# Patient Record
Sex: Male | Born: 1955 | Race: White | Hispanic: No | Marital: Married | Smoking: Never smoker
Health system: Southern US, Community
[De-identification: ages and names within clinical notes are randomized; demographics above are authoritative.]

## PROBLEM LIST (undated history)

## (undated) DIAGNOSIS — IMO0002 Reserved for concepts with insufficient information to code with codable children: Secondary | ICD-10-CM

## (undated) DIAGNOSIS — Z86010 Personal history of colonic polyps: Secondary | ICD-10-CM

## (undated) DIAGNOSIS — N503 Cyst of epididymis: Secondary | ICD-10-CM

## (undated) DIAGNOSIS — Z950 Presence of cardiac pacemaker: Secondary | ICD-10-CM

## (undated) DIAGNOSIS — I255 Ischemic cardiomyopathy: Secondary | ICD-10-CM

## (undated) DIAGNOSIS — E785 Hyperlipidemia, unspecified: Secondary | ICD-10-CM

## (undated) DIAGNOSIS — N289 Disorder of kidney and ureter, unspecified: Secondary | ICD-10-CM

## (undated) DIAGNOSIS — R9430 Abnormal result of cardiovascular function study, unspecified: Secondary | ICD-10-CM

## (undated) DIAGNOSIS — I454 Nonspecific intraventricular block: Secondary | ICD-10-CM

## (undated) DIAGNOSIS — I2510 Atherosclerotic heart disease of native coronary artery without angina pectoris: Secondary | ICD-10-CM

## (undated) DIAGNOSIS — I219 Acute myocardial infarction, unspecified: Secondary | ICD-10-CM

## (undated) DIAGNOSIS — I4891 Unspecified atrial fibrillation: Secondary | ICD-10-CM

## (undated) DIAGNOSIS — E875 Hyperkalemia: Secondary | ICD-10-CM

## (undated) DIAGNOSIS — Z95810 Presence of automatic (implantable) cardiac defibrillator: Secondary | ICD-10-CM

## (undated) DIAGNOSIS — N189 Chronic kidney disease, unspecified: Secondary | ICD-10-CM

## (undated) DIAGNOSIS — R7989 Other specified abnormal findings of blood chemistry: Secondary | ICD-10-CM

## (undated) DIAGNOSIS — E039 Hypothyroidism, unspecified: Secondary | ICD-10-CM

## (undated) DIAGNOSIS — E119 Type 2 diabetes mellitus without complications: Secondary | ICD-10-CM

## (undated) DIAGNOSIS — I5022 Chronic systolic (congestive) heart failure: Secondary | ICD-10-CM

## (undated) HISTORY — DX: Reserved for concepts with insufficient information to code with codable children: IMO0002

## (undated) HISTORY — DX: Hyperkalemia: E87.5

## (undated) HISTORY — DX: Other specified abnormal findings of blood chemistry: R79.89

## (undated) HISTORY — DX: Disorder of kidney and ureter, unspecified: N28.9

## (undated) HISTORY — PX: CYSTECTOMY: SUR359

## (undated) HISTORY — DX: Unspecified atrial fibrillation: I48.91

## (undated) HISTORY — DX: Chronic systolic (congestive) heart failure: I50.22

## (undated) HISTORY — PX: CATARACT EXTRACTION: SUR2

## (undated) HISTORY — DX: Chronic kidney disease, unspecified: N18.9

## (undated) HISTORY — PX: TONSILLECTOMY: SUR1361

## (undated) HISTORY — DX: Hypothyroidism, unspecified: E03.9

## (undated) HISTORY — DX: Ischemic cardiomyopathy: I25.5

## (undated) HISTORY — DX: Presence of automatic (implantable) cardiac defibrillator: Z95.810

## (undated) HISTORY — DX: Nonspecific intraventricular block: I45.4

## (undated) HISTORY — DX: Personal history of colonic polyps: Z86.010

## (undated) HISTORY — DX: Type 2 diabetes mellitus without complications: E11.9

## (undated) HISTORY — DX: Atherosclerotic heart disease of native coronary artery without angina pectoris: I25.10

## (undated) HISTORY — DX: Hyperlipidemia, unspecified: E78.5

## (undated) HISTORY — DX: Abnormal result of cardiovascular function study, unspecified: R94.30

## (undated) HISTORY — DX: Acute myocardial infarction, unspecified: I21.9

## (undated) HISTORY — DX: Cyst of epididymis: N50.3

---

## 2001-01-21 HISTORY — PX: CORONARY ARTERY BYPASS GRAFT: SHX141

## 2001-07-03 ENCOUNTER — Encounter: Payer: Self-pay | Admitting: Cardiology

## 2001-07-03 ENCOUNTER — Inpatient Hospital Stay (HOSPITAL_COMMUNITY): Admission: EM | Admit: 2001-07-03 | Discharge: 2001-07-09 | Payer: Self-pay | Admitting: Emergency Medicine

## 2001-07-03 ENCOUNTER — Encounter: Payer: Self-pay | Admitting: Emergency Medicine

## 2001-07-03 ENCOUNTER — Encounter: Payer: Self-pay | Admitting: *Deleted

## 2001-07-15 ENCOUNTER — Encounter: Admission: RE | Admit: 2001-07-15 | Discharge: 2001-10-13 | Payer: Self-pay | Admitting: Cardiology

## 2001-09-18 ENCOUNTER — Encounter: Payer: Self-pay | Admitting: Thoracic Surgery (Cardiothoracic Vascular Surgery)

## 2001-09-18 ENCOUNTER — Encounter
Admission: RE | Admit: 2001-09-18 | Discharge: 2001-09-18 | Payer: Self-pay | Admitting: Thoracic Surgery (Cardiothoracic Vascular Surgery)

## 2001-09-24 ENCOUNTER — Encounter: Payer: Self-pay | Admitting: Thoracic Surgery (Cardiothoracic Vascular Surgery)

## 2001-09-29 ENCOUNTER — Inpatient Hospital Stay (HOSPITAL_COMMUNITY)
Admission: RE | Admit: 2001-09-29 | Discharge: 2001-10-06 | Payer: Self-pay | Admitting: Thoracic Surgery (Cardiothoracic Vascular Surgery)

## 2001-09-29 ENCOUNTER — Encounter: Payer: Self-pay | Admitting: Thoracic Surgery (Cardiothoracic Vascular Surgery)

## 2001-09-29 ENCOUNTER — Encounter (INDEPENDENT_AMBULATORY_CARE_PROVIDER_SITE_OTHER): Payer: Self-pay | Admitting: Specialist

## 2001-09-30 ENCOUNTER — Encounter: Payer: Self-pay | Admitting: Thoracic Surgery (Cardiothoracic Vascular Surgery)

## 2001-10-01 ENCOUNTER — Encounter: Payer: Self-pay | Admitting: Thoracic Surgery (Cardiothoracic Vascular Surgery)

## 2001-10-02 ENCOUNTER — Encounter: Payer: Self-pay | Admitting: Thoracic Surgery (Cardiothoracic Vascular Surgery)

## 2001-11-12 ENCOUNTER — Encounter (INDEPENDENT_AMBULATORY_CARE_PROVIDER_SITE_OTHER): Payer: Self-pay | Admitting: Specialist

## 2001-11-12 ENCOUNTER — Inpatient Hospital Stay (HOSPITAL_COMMUNITY): Admission: RE | Admit: 2001-11-12 | Discharge: 2001-11-13 | Payer: Self-pay | Admitting: Otolaryngology

## 2001-12-01 ENCOUNTER — Encounter: Payer: Self-pay | Admitting: *Deleted

## 2001-12-01 ENCOUNTER — Ambulatory Visit (HOSPITAL_COMMUNITY): Admission: RE | Admit: 2001-12-01 | Discharge: 2001-12-01 | Payer: Self-pay | Admitting: *Deleted

## 2001-12-03 ENCOUNTER — Ambulatory Visit (HOSPITAL_COMMUNITY): Admission: RE | Admit: 2001-12-03 | Discharge: 2001-12-03 | Payer: Self-pay | Admitting: *Deleted

## 2001-12-03 ENCOUNTER — Encounter: Payer: Self-pay | Admitting: *Deleted

## 2002-01-25 ENCOUNTER — Encounter: Admission: RE | Admit: 2002-01-25 | Discharge: 2002-04-25 | Payer: Self-pay | Admitting: Neurology

## 2002-03-01 ENCOUNTER — Ambulatory Visit: Admission: RE | Admit: 2002-03-01 | Discharge: 2002-03-09 | Payer: Self-pay | Admitting: Radiation Oncology

## 2002-04-12 ENCOUNTER — Ambulatory Visit: Admission: RE | Admit: 2002-04-12 | Discharge: 2002-05-17 | Payer: Self-pay | Admitting: Radiation Oncology

## 2002-04-16 ENCOUNTER — Encounter: Payer: Self-pay | Admitting: Radiation Oncology

## 2002-04-16 ENCOUNTER — Ambulatory Visit (HOSPITAL_COMMUNITY): Admission: RE | Admit: 2002-04-16 | Discharge: 2002-04-16 | Payer: Self-pay | Admitting: Radiation Oncology

## 2002-06-14 ENCOUNTER — Ambulatory Visit: Admission: RE | Admit: 2002-06-14 | Discharge: 2002-06-14 | Payer: Self-pay | Admitting: Radiation Oncology

## 2002-06-15 ENCOUNTER — Encounter: Payer: Self-pay | Admitting: *Deleted

## 2002-06-15 ENCOUNTER — Ambulatory Visit (HOSPITAL_COMMUNITY): Admission: RE | Admit: 2002-06-15 | Discharge: 2002-06-15 | Payer: Self-pay | Admitting: *Deleted

## 2004-03-16 ENCOUNTER — Ambulatory Visit: Payer: Self-pay | Admitting: Family Medicine

## 2004-04-25 ENCOUNTER — Ambulatory Visit: Payer: Self-pay | Admitting: Family Medicine

## 2004-07-16 ENCOUNTER — Ambulatory Visit: Payer: Self-pay | Admitting: Internal Medicine

## 2005-01-18 ENCOUNTER — Ambulatory Visit: Payer: Self-pay | Admitting: Internal Medicine

## 2005-03-01 ENCOUNTER — Ambulatory Visit: Payer: Self-pay | Admitting: Internal Medicine

## 2005-03-15 ENCOUNTER — Ambulatory Visit: Payer: Self-pay | Admitting: Internal Medicine

## 2005-03-25 ENCOUNTER — Ambulatory Visit: Payer: Self-pay | Admitting: Cardiology

## 2005-06-12 ENCOUNTER — Ambulatory Visit: Payer: Self-pay | Admitting: Internal Medicine

## 2005-08-19 ENCOUNTER — Ambulatory Visit: Payer: Self-pay | Admitting: Internal Medicine

## 2005-09-30 ENCOUNTER — Ambulatory Visit: Payer: Self-pay | Admitting: Internal Medicine

## 2005-11-06 ENCOUNTER — Ambulatory Visit: Payer: Self-pay | Admitting: Internal Medicine

## 2005-11-06 LAB — CONVERTED CEMR LAB
Chol/HDL Ratio, serum: 6.6
Cholesterol: 202 mg/dL (ref 0–200)
HDL: 30.8 mg/dL — ABNORMAL LOW (ref >39.0)
Hgb A1c MFr Bld: 5.9 % (ref 4.6–6.0)
LDL DIRECT: 119.3 mg/dL
TSH: 1.47 microintl units/mL (ref 0.35–5.50)
Triglyceride fasting, serum: 306 mg/dL (ref 0–149)
VLDL: 61 mg/dL — ABNORMAL HIGH (ref 0–40)

## 2006-01-31 ENCOUNTER — Ambulatory Visit: Payer: Self-pay | Admitting: Internal Medicine

## 2006-02-14 ENCOUNTER — Ambulatory Visit: Payer: Self-pay | Admitting: Internal Medicine

## 2006-02-14 LAB — CONVERTED CEMR LAB
ALT: 20 units/L (ref 0–40)
AST: 22 units/L (ref 0–37)
Cholesterol: 148 mg/dL (ref 0–200)
Direct LDL: 92.2 mg/dL
HDL: 30.4 mg/dL — ABNORMAL LOW (ref >39.0)
TSH: 2.58 microintl units/mL (ref 0.35–5.50)
Total CHOL/HDL Ratio: 4.9
Triglycerides: 248 mg/dL (ref 0–149)
VLDL: 50 mg/dL — ABNORMAL HIGH (ref 0–40)

## 2006-02-15 DIAGNOSIS — F1410 Cocaine abuse, uncomplicated: Secondary | ICD-10-CM | POA: Insufficient documentation

## 2006-02-15 DIAGNOSIS — I2589 Other forms of chronic ischemic heart disease: Secondary | ICD-10-CM | POA: Insufficient documentation

## 2006-02-15 DIAGNOSIS — I480 Paroxysmal atrial fibrillation: Secondary | ICD-10-CM | POA: Insufficient documentation

## 2006-02-15 DIAGNOSIS — E039 Hypothyroidism, unspecified: Secondary | ICD-10-CM | POA: Insufficient documentation

## 2006-02-15 DIAGNOSIS — Z8571 Personal history of Hodgkin lymphoma: Secondary | ICD-10-CM | POA: Insufficient documentation

## 2006-05-26 ENCOUNTER — Ambulatory Visit: Payer: Self-pay | Admitting: Internal Medicine

## 2006-05-26 LAB — CONVERTED CEMR LAB
BUN: 23 mg/dL (ref 6–23)
Basophils Absolute: 0.1 10*3/uL (ref 0.0–0.1)
Basophils Relative: 0.8 % (ref 0.0–1.0)
CO2: 25 meq/L (ref 19–32)
Calcium: 9.4 mg/dL (ref 8.4–10.5)
Chloride: 103 meq/L (ref 96–112)
Creatinine, Ser: 1 mg/dL (ref 0.4–1.5)
Creatinine,U: 143.2 mg/dL
Eosinophils Absolute: 0.3 10*3/uL (ref 0.0–0.6)
Eosinophils Relative: 3.9 % (ref 0.0–5.0)
GFR calc Af Amer: 102 mL/min
GFR calc non Af Amer: 84 mL/min
Glucose, Bld: 100 mg/dL — ABNORMAL HIGH (ref 70–99)
HCT: 39.6 % (ref 39.0–52.0)
Hemoglobin: 13.5 g/dL (ref 13.0–17.0)
Hgb A1c MFr Bld: 6.3 % — ABNORMAL HIGH (ref 4.6–6.0)
Lymphocytes Relative: 27.7 % (ref 12.0–46.0)
MCHC: 34 g/dL (ref 30.0–36.0)
MCV: 82.3 fL (ref 78.0–100.0)
Microalb Creat Ratio: 18.9 mg/g (ref 0.0–30.0)
Microalb, Ur: 2.7 mg/dL — ABNORMAL HIGH (ref 0.0–1.9)
Monocytes Absolute: 0.4 10*3/uL (ref 0.2–0.7)
Monocytes Relative: 6.2 % (ref 3.0–11.0)
Neutro Abs: 4.3 10*3/uL (ref 1.4–7.7)
Neutrophils Relative %: 61.4 % (ref 43.0–77.0)
Platelets: 253 10*3/uL (ref 150–400)
Potassium: 4.5 meq/L (ref 3.5–5.1)
RBC: 4.82 M/uL (ref 4.22–5.81)
RDW: 13.2 % (ref 11.5–14.6)
Sodium: 136 meq/L (ref 135–145)
TSH: 2.77 microintl units/mL (ref 0.35–5.50)
WBC: 7.1 10*3/uL (ref 4.5–10.5)

## 2006-07-29 ENCOUNTER — Ambulatory Visit: Payer: Self-pay | Admitting: Internal Medicine

## 2006-08-27 ENCOUNTER — Encounter: Payer: Self-pay | Admitting: Internal Medicine

## 2006-12-23 ENCOUNTER — Encounter (INDEPENDENT_AMBULATORY_CARE_PROVIDER_SITE_OTHER): Payer: Self-pay | Admitting: *Deleted

## 2007-01-19 ENCOUNTER — Telehealth (INDEPENDENT_AMBULATORY_CARE_PROVIDER_SITE_OTHER): Payer: Self-pay | Admitting: *Deleted

## 2007-04-09 ENCOUNTER — Ambulatory Visit: Payer: Self-pay | Admitting: Cardiology

## 2007-05-12 ENCOUNTER — Ambulatory Visit: Payer: Self-pay | Admitting: Internal Medicine

## 2007-05-12 DIAGNOSIS — E785 Hyperlipidemia, unspecified: Secondary | ICD-10-CM | POA: Insufficient documentation

## 2007-05-14 ENCOUNTER — Telehealth (INDEPENDENT_AMBULATORY_CARE_PROVIDER_SITE_OTHER): Payer: Self-pay | Admitting: *Deleted

## 2007-05-14 LAB — CONVERTED CEMR LAB
ALT: 23 units/L (ref 0–53)
AST: 21 units/L (ref 0–37)
BUN: 29 mg/dL — ABNORMAL HIGH (ref 6–23)
CO2: 27 meq/L (ref 19–32)
Calcium: 9.7 mg/dL (ref 8.4–10.5)
Chloride: 104 meq/L (ref 96–112)
Cholesterol: 176 mg/dL (ref 0–200)
Creatinine, Ser: 1.2 mg/dL (ref 0.4–1.5)
Creatinine,U: 118.8 mg/dL
Direct LDL: 100.1 mg/dL
GFR calc Af Amer: 82 mL/min
GFR calc non Af Amer: 68 mL/min
Glucose, Bld: 136 mg/dL — ABNORMAL HIGH (ref 70–99)
HDL: 30 mg/dL — ABNORMAL LOW (ref >39.0)
Hemoglobin: 12.9 g/dL — ABNORMAL LOW (ref 13.0–17.0)
Hgb A1c MFr Bld: 6.8 % — ABNORMAL HIGH (ref 4.6–6.0)
Microalb Creat Ratio: 37 mg/g — ABNORMAL HIGH (ref 0.0–30.0)
Microalb, Ur: 4.4 mg/dL — ABNORMAL HIGH (ref 0.0–1.9)
PSA: 0.75 ng/mL (ref 0.10–4.00)
Potassium: 5 meq/L (ref 3.5–5.1)
Sodium: 138 meq/L (ref 135–145)
TSH: 1.6 microintl units/mL (ref 0.35–5.50)
Total CHOL/HDL Ratio: 5.9
Triglycerides: 339 mg/dL (ref 0–149)
VLDL: 68 mg/dL — ABNORMAL HIGH (ref 0–40)

## 2007-05-21 ENCOUNTER — Ambulatory Visit: Payer: Self-pay | Admitting: Internal Medicine

## 2007-05-22 ENCOUNTER — Encounter (INDEPENDENT_AMBULATORY_CARE_PROVIDER_SITE_OTHER): Payer: Self-pay | Admitting: *Deleted

## 2007-05-22 LAB — CONVERTED CEMR LAB
OCCULT 1: NEGATIVE
OCCULT 2: NEGATIVE
OCCULT 3: NEGATIVE

## 2007-09-11 ENCOUNTER — Ambulatory Visit: Payer: Self-pay | Admitting: Internal Medicine

## 2008-02-03 ENCOUNTER — Ambulatory Visit: Payer: Self-pay | Admitting: Internal Medicine

## 2008-02-03 DIAGNOSIS — F528 Other sexual dysfunction not due to a substance or known physiological condition: Secondary | ICD-10-CM | POA: Insufficient documentation

## 2008-02-09 ENCOUNTER — Telehealth (INDEPENDENT_AMBULATORY_CARE_PROVIDER_SITE_OTHER): Payer: Self-pay | Admitting: *Deleted

## 2008-02-09 LAB — CONVERTED CEMR LAB
ALT: 16 units/L (ref 0–53)
AST: 18 units/L (ref 0–37)
BUN: 27 mg/dL — ABNORMAL HIGH (ref 6–23)
Basophils Absolute: 0 10*3/uL (ref 0.0–0.1)
Basophils Relative: 0.3 % (ref 0.0–3.0)
CO2: 23 meq/L (ref 19–32)
Calcium: 9.4 mg/dL (ref 8.4–10.5)
Chloride: 107 meq/L (ref 96–112)
Cholesterol: 153 mg/dL (ref 0–200)
Creatinine, Ser: 1.3 mg/dL (ref 0.4–1.5)
Direct LDL: 86.8 mg/dL
Eosinophils Absolute: 0.4 10*3/uL (ref 0.0–0.7)
Eosinophils Relative: 4.5 % (ref 0.0–5.0)
GFR calc Af Amer: 75 mL/min
GFR calc non Af Amer: 62 mL/min
Glucose, Bld: 100 mg/dL — ABNORMAL HIGH (ref 70–99)
HCT: 36.7 % — ABNORMAL LOW (ref 39.0–52.0)
HDL: 27.6 mg/dL — ABNORMAL LOW (ref >39.0)
Hemoglobin: 12.8 g/dL — ABNORMAL LOW (ref 13.0–17.0)
Hgb A1c MFr Bld: 8.4 % — ABNORMAL HIGH (ref 4.6–6.0)
Lymphocytes Relative: 24.7 % (ref 12.0–46.0)
MCHC: 34.8 g/dL (ref 30.0–36.0)
MCV: 81 fL (ref 78.0–100.0)
Monocytes Absolute: 0.5 10*3/uL (ref 0.1–1.0)
Monocytes Relative: 5.3 % (ref 3.0–12.0)
Neutro Abs: 5.8 10*3/uL (ref 1.4–7.7)
Neutrophils Relative %: 65.2 % (ref 43.0–77.0)
Platelets: 219 10*3/uL (ref 150–400)
Potassium: 4.7 meq/L (ref 3.5–5.1)
RBC: 4.53 M/uL (ref 4.22–5.81)
RDW: 13.7 % (ref 11.5–14.6)
Sodium: 139 meq/L (ref 135–145)
TSH: 3.45 microintl units/mL (ref 0.35–5.50)
Total CHOL/HDL Ratio: 5.5
Triglycerides: 272 mg/dL (ref 0–149)
VLDL: 54 mg/dL — ABNORMAL HIGH (ref 0–40)
WBC: 8.9 10*3/uL (ref 4.5–10.5)

## 2008-02-11 ENCOUNTER — Encounter: Payer: Self-pay | Admitting: Internal Medicine

## 2008-02-12 ENCOUNTER — Telehealth: Payer: Self-pay | Admitting: Internal Medicine

## 2008-02-17 ENCOUNTER — Ambulatory Visit: Payer: Self-pay | Admitting: Internal Medicine

## 2008-02-17 DIAGNOSIS — R030 Elevated blood-pressure reading, without diagnosis of hypertension: Secondary | ICD-10-CM | POA: Insufficient documentation

## 2008-06-02 ENCOUNTER — Ambulatory Visit: Payer: Self-pay | Admitting: Internal Medicine

## 2008-06-08 LAB — CONVERTED CEMR LAB
Hgb A1c MFr Bld: 5.8 % (ref 4.6–6.5)
TSH: 0.24 microintl units/mL — ABNORMAL LOW (ref 0.35–5.50)

## 2008-08-15 ENCOUNTER — Telehealth (INDEPENDENT_AMBULATORY_CARE_PROVIDER_SITE_OTHER): Payer: Self-pay | Admitting: *Deleted

## 2008-09-08 ENCOUNTER — Ambulatory Visit: Payer: Self-pay | Admitting: Internal Medicine

## 2008-09-08 LAB — CONVERTED CEMR LAB
Iron: 45 ug/dL (ref 42–165)
Saturation Ratios: 11.9 % — ABNORMAL LOW (ref 20.0–50.0)
Transferrin: 271.2 mg/dL (ref 212.0–360.0)

## 2008-09-09 ENCOUNTER — Encounter (INDEPENDENT_AMBULATORY_CARE_PROVIDER_SITE_OTHER): Payer: Self-pay | Admitting: *Deleted

## 2008-09-14 LAB — CONVERTED CEMR LAB
ALT: 16 units/L (ref 0–53)
AST: 17 units/L (ref 0–37)
BUN: 31 mg/dL — ABNORMAL HIGH (ref 6–23)
CO2: 25 meq/L (ref 19–32)
Calcium: 8.9 mg/dL (ref 8.4–10.5)
Chloride: 106 meq/L (ref 96–112)
Cholesterol: 121 mg/dL (ref 0–200)
Creatinine, Ser: 1.3 mg/dL (ref 0.4–1.5)
Creatinine,U: 115.5 mg/dL
GFR calc non Af Amer: 61.65 mL/min (ref >60)
Glucose, Bld: 48 mg/dL — CL (ref 70–99)
HDL: 30.3 mg/dL — ABNORMAL LOW (ref >39.00)
Hemoglobin: 12.4 g/dL — ABNORMAL LOW (ref 13.0–17.0)
Hgb A1c MFr Bld: 5.7 % (ref 4.6–6.5)
LDL Cholesterol: 56 mg/dL (ref 0–99)
Microalb Creat Ratio: 8.7 mg/g (ref 0.0–30.0)
Microalb, Ur: 1 mg/dL (ref 0.0–1.9)
PSA: 1.02 ng/mL (ref 0.10–4.00)
Potassium: 4.5 meq/L (ref 3.5–5.1)
Sodium: 139 meq/L (ref 135–145)
TSH: 5.15 microintl units/mL (ref 0.35–5.50)
Total CHOL/HDL Ratio: 4
Triglycerides: 174 mg/dL — ABNORMAL HIGH (ref 0.0–149.0)
VLDL: 34.8 mg/dL (ref 0.0–40.0)

## 2008-09-27 ENCOUNTER — Ambulatory Visit: Payer: Self-pay | Admitting: Internal Medicine

## 2008-09-28 ENCOUNTER — Encounter (INDEPENDENT_AMBULATORY_CARE_PROVIDER_SITE_OTHER): Payer: Self-pay | Admitting: *Deleted

## 2008-09-28 LAB — CONVERTED CEMR LAB: Fecal Occult Bld: NEGATIVE

## 2009-02-10 ENCOUNTER — Ambulatory Visit: Payer: Self-pay | Admitting: Internal Medicine

## 2009-02-16 ENCOUNTER — Encounter (INDEPENDENT_AMBULATORY_CARE_PROVIDER_SITE_OTHER): Payer: Self-pay | Admitting: *Deleted

## 2009-02-16 LAB — CONVERTED CEMR LAB
Digitoxin Lvl: 0.1 ng/mL — ABNORMAL LOW (ref 0.8–2.0)
Hgb A1c MFr Bld: 6.3 % (ref 4.6–6.5)
TSH: 4.2 microintl units/mL (ref 0.35–5.50)

## 2009-05-17 ENCOUNTER — Ambulatory Visit: Payer: Self-pay | Admitting: Internal Medicine

## 2009-05-22 DIAGNOSIS — R944 Abnormal results of kidney function studies: Secondary | ICD-10-CM | POA: Insufficient documentation

## 2009-05-22 LAB — CONVERTED CEMR LAB
ALT: 21 units/L (ref 0–53)
AST: 21 units/L (ref 0–37)
BUN: 32 mg/dL — ABNORMAL HIGH (ref 6–23)
CO2: 28 meq/L (ref 19–32)
Calcium: 9.1 mg/dL (ref 8.4–10.5)
Chloride: 101 meq/L (ref 96–112)
Creatinine, Ser: 1.5 mg/dL (ref 0.4–1.5)
GFR calc non Af Amer: 52.12 mL/min (ref >60)
Glucose, Bld: 156 mg/dL — ABNORMAL HIGH (ref 70–99)
Hgb A1c MFr Bld: 6.7 % — ABNORMAL HIGH (ref 4.6–6.5)
Potassium: 4.7 meq/L (ref 3.5–5.1)
Sodium: 138 meq/L (ref 135–145)

## 2009-05-24 ENCOUNTER — Encounter: Payer: Self-pay | Admitting: Cardiology

## 2009-05-25 ENCOUNTER — Ambulatory Visit: Payer: Self-pay | Admitting: Cardiology

## 2009-05-25 DIAGNOSIS — N259 Disorder resulting from impaired renal tubular function, unspecified: Secondary | ICD-10-CM | POA: Insufficient documentation

## 2009-06-14 ENCOUNTER — Ambulatory Visit: Payer: Self-pay

## 2009-06-14 ENCOUNTER — Ambulatory Visit: Payer: Self-pay | Admitting: Internal Medicine

## 2009-06-14 ENCOUNTER — Encounter: Payer: Self-pay | Admitting: Cardiology

## 2009-06-14 ENCOUNTER — Ambulatory Visit (HOSPITAL_COMMUNITY): Admission: RE | Admit: 2009-06-14 | Discharge: 2009-06-14 | Payer: Self-pay | Admitting: Cardiology

## 2009-06-22 ENCOUNTER — Encounter: Payer: Self-pay | Admitting: Cardiology

## 2009-06-23 ENCOUNTER — Ambulatory Visit: Payer: Self-pay | Admitting: Cardiology

## 2009-07-20 ENCOUNTER — Ambulatory Visit: Payer: Self-pay | Admitting: Cardiology

## 2009-08-21 ENCOUNTER — Ambulatory Visit: Payer: Self-pay | Admitting: Internal Medicine

## 2009-08-21 ENCOUNTER — Telehealth (INDEPENDENT_AMBULATORY_CARE_PROVIDER_SITE_OTHER): Payer: Self-pay | Admitting: *Deleted

## 2009-08-21 LAB — CONVERTED CEMR LAB
BUN: 38 mg/dL — ABNORMAL HIGH (ref 6–23)
CO2: 25 meq/L (ref 19–32)
Calcium: 9.4 mg/dL (ref 8.4–10.5)
Chloride: 107 meq/L (ref 96–112)
Cholesterol: 175 mg/dL (ref 0–200)
Creatinine, Ser: 1.4 mg/dL (ref 0.4–1.5)
Direct LDL: 96.3 mg/dL
GFR calc non Af Amer: 55.47 mL/min (ref >60)
Glucose, Bld: 126 mg/dL — ABNORMAL HIGH (ref 70–99)
HDL: 32.3 mg/dL — ABNORMAL LOW (ref >39.00)
Hgb A1c MFr Bld: 6.7 % — ABNORMAL HIGH (ref 4.6–6.5)
Potassium: 5.5 meq/L — ABNORMAL HIGH (ref 3.5–5.1)
Sodium: 139 meq/L (ref 135–145)
Total CHOL/HDL Ratio: 5
Triglycerides: 351 mg/dL — ABNORMAL HIGH (ref 0.0–149.0)
VLDL: 70.2 mg/dL — ABNORMAL HIGH (ref 0.0–40.0)

## 2009-08-31 ENCOUNTER — Telehealth: Payer: Self-pay | Admitting: Cardiology

## 2009-08-31 ENCOUNTER — Ambulatory Visit: Payer: Self-pay | Admitting: Cardiology

## 2009-09-04 ENCOUNTER — Telehealth (INDEPENDENT_AMBULATORY_CARE_PROVIDER_SITE_OTHER): Payer: Self-pay | Admitting: *Deleted

## 2009-09-04 ENCOUNTER — Telehealth: Payer: Self-pay | Admitting: Cardiology

## 2009-09-08 ENCOUNTER — Ambulatory Visit: Payer: Self-pay | Admitting: Internal Medicine

## 2009-09-15 LAB — CONVERTED CEMR LAB
BUN: 34 mg/dL — ABNORMAL HIGH (ref 6–23)
CO2: 22 meq/L (ref 19–32)
Calcium: 9 mg/dL (ref 8.4–10.5)
Chloride: 105 meq/L (ref 96–112)
Creatinine, Ser: 1.5 mg/dL (ref 0.4–1.5)
GFR calc non Af Amer: 52.46 mL/min (ref >60)
Glucose, Bld: 136 mg/dL — ABNORMAL HIGH (ref 70–99)
Potassium: 4.8 meq/L (ref 3.5–5.1)
Sodium: 138 meq/L (ref 135–145)

## 2009-09-18 ENCOUNTER — Encounter: Payer: Self-pay | Admitting: Internal Medicine

## 2009-10-25 ENCOUNTER — Ambulatory Visit: Payer: Self-pay | Admitting: Cardiology

## 2009-12-21 ENCOUNTER — Encounter (INDEPENDENT_AMBULATORY_CARE_PROVIDER_SITE_OTHER): Payer: Self-pay | Admitting: *Deleted

## 2009-12-21 ENCOUNTER — Ambulatory Visit: Payer: Self-pay | Admitting: Internal Medicine

## 2009-12-21 LAB — CONVERTED CEMR LAB
ALT: 19 units/L
AST: 21 units/L
Hgb A1c MFr Bld: 6.3 %

## 2009-12-27 ENCOUNTER — Encounter (INDEPENDENT_AMBULATORY_CARE_PROVIDER_SITE_OTHER): Payer: Self-pay | Admitting: *Deleted

## 2009-12-27 ENCOUNTER — Telehealth: Payer: Self-pay | Admitting: Internal Medicine

## 2010-02-06 ENCOUNTER — Ambulatory Visit
Admission: RE | Admit: 2010-02-06 | Discharge: 2010-02-06 | Payer: Self-pay | Source: Home / Self Care | Attending: Cardiology | Admitting: Cardiology

## 2010-02-12 ENCOUNTER — Ambulatory Visit: Admission: RE | Admit: 2010-02-12 | Discharge: 2010-02-12 | Payer: Self-pay | Source: Home / Self Care

## 2010-02-12 ENCOUNTER — Ambulatory Visit (HOSPITAL_COMMUNITY)
Admission: RE | Admit: 2010-02-12 | Discharge: 2010-02-12 | Payer: Self-pay | Source: Home / Self Care | Attending: Cardiology | Admitting: Cardiology

## 2010-02-13 ENCOUNTER — Telehealth: Payer: Self-pay | Admitting: Cardiology

## 2010-02-13 ENCOUNTER — Encounter: Payer: Self-pay | Admitting: Cardiology

## 2010-02-20 ENCOUNTER — Telehealth (INDEPENDENT_AMBULATORY_CARE_PROVIDER_SITE_OTHER): Payer: Self-pay | Admitting: *Deleted

## 2010-02-20 ENCOUNTER — Encounter: Payer: Self-pay | Admitting: Internal Medicine

## 2010-02-20 ENCOUNTER — Encounter: Payer: Self-pay | Admitting: Cardiology

## 2010-02-20 NOTE — Progress Notes (Signed)
Summary: refill  Phone Note Refill Request Call back at Home Phone [phone] Message from:  Patient  Refills Requested: Medication #1:  GLUCOPHAGE 850 MG TABS 1 by mouth tid  Medication #2:  DIGITEK 0.125 MG TABS 1 by mouth qd  Medication #3:  LOTENSIN 20 MG TABS 1/2 by mouth once daily  Medication #4:  FUROSEMIDE 20 MG TABS take one tab by mouth daily patient is out of town he forgot his meds - call in to [facility] - [phone]  Initial call taken by: Okey Regal Spring,  September 04, 2009 8:49 AM    New/Updated Medications: FUROSEMIDE 40 MG TABS (FUROSEMIDE) 1 by mouth daily. Prescriptions: FUROSEMIDE 40 MG TABS (FUROSEMIDE) 1 by mouth daily.  #30 x 0   Entered by:   Army Fossa CMA   Authorized by:   Nolon Rod. Paz MD   Signed by:   Army Fossa CMA on 09/04/2009   Method used:   Print then Give to Patient   RxID:   [id number] LOTENSIN 20 MG TABS (BENAZEPRIL HCL) 1/2 by mouth once daily  #30 Tablet x 0   Entered by:   Army Fossa CMA   Authorized by:   Nolon Rod. Paz MD   Signed by:   Army Fossa CMA on 09/04/2009   Method used:   Print then Give to Patient   RxID:   [id number] DIGITEK 0.125 MG TABS (DIGOXIN) 1 by mouth qd  #30 Tablet x 0   Entered by:   Army Fossa CMA   Authorized by:   Nolon Rod. Paz MD   Signed by:   Army Fossa CMA on 09/04/2009   Method used:   Print then Give to Patient   RxID:   [id number] GLUCOPHAGE 850 MG TABS (METFORMIN HCL) 1 by mouth tid  #90 Tablet x 0   Entered by:   Army Fossa CMA   Authorized by:   Nolon Rod. Paz MD   Signed by:   Army Fossa CMA on 09/04/2009   Method used:   Print then Give to Patient   RxID:   [id number]

## 2010-02-20 NOTE — Progress Notes (Signed)
Summary: Labs  Phone Note Outgoing Call   Summary of Call: --potassium elevated from baseline, related to recent decrease on Lasix? Will discuss with cardiology in the morning -- cholesterol used to be better --renal function stable --diabetes is stable Jose E. Paz MD  August 21, 2009 7:02 PM   Follow-up for Phone Call        primary cardiologist out of town Plan: increase Lasix from 20 mg to 40 mg daily nurse visit for BP check (not charge) and BMP in 10 days He is to  let us know if he feels weak or orthostatic. also cholesterol used to be better, watch diet Jose E. Paz MD  August 22, 2009 1:32 PM   Additional Follow-up for Phone Call Additional follow up Details #1::        Patient is aware and will call back to make appt, he was driving right now.  Additional Follow-up by: Harold Barban,  August 22, 2009 2:21 PM

## 2010-02-20 NOTE — Assessment & Plan Note (Signed)
Summary: per check out/sf   Visit Type:  Follow-up Referring Provider:  Zetta Bills, MD Primary Provider:  Nolon Rod. Paz MD  CC:  cardiomyopathy.  History of Present Illness: Tthe patient is seen today to followup cardiomyopathy. He is feeling well.  He is quite active.  He is on optimal doses of beta blockers and ACE inhibitors.  Because his potassium was on a higher side, I have not yet been able to spironolactone.  This will be considered over time.  He has been seen by Dr. Allena Selene Peltzer both nephrology.  He has chronic kidney disease stage III.  It is thought to be multifactorial.  He is to be watched carefully.  Current Medications (verified): 1)  Amaryl 2 Mg Tabs (Glimepiride) .... Daily 2)  Glucophage 850 Mg Tabs (Metformin Hcl) .Marland Kitchen.. 1 By Mouth Tid 3)  Digitek 0.125 Mg Tabs (Digoxin) .Marland Kitchen.. 1 By Mouth Qd 4)  Lotensin 20 Mg Tabs (Benazepril Hcl) .... 1/2 By Mouth Once Daily 5)  Furosemide 40 Mg Tabs (Furosemide) .Marland Kitchen.. 1 By Mouth Daily. 6)  Synthroid 88 Mcg Tabs (Levothyroxine Sodium) .... One By Mouth Daily 7)  Zocor 40 Mg Tabs (Simvastatin) .Marland Kitchen.. 1 By Mouth At Bedtime. 8)  Bayer Aspirin 325 Mg Tabs (Aspirin) .... Take 1 Tablet By Mouth Once A Day 9)  Carvedilol 25 Mg Tabs (Carvedilol) .... Take One Tablet By Mouth Twice A Day  Allergies (verified): No Known Drug Allergies  Past History:  Past Medical History: EF  40% range /   EF 20-25%...echo..06/14/2009.Marland Kitchen CAD- CABG 2003 Hyperlipidemia...low HDL Diabetes mellitus, type II Hypothyroidism ABUSE, COCAINE, ALCOHOL, ETC ...resolved for many years ATRIAL FIBRILLATION, HX OF  HODGKIN'S DISEASE--Dx aprox 2004, s/p XRT-Chemo IVCD Creatinine... up to 1.5.Marland KitchenMarland KitchenMarland Kitchen 2011  /  CKD 3.. multifactorial... Dr. Allena Alliya Marcon.. consult September 18, 2009 Hyperkalemia   .... August, 2011... repeat potassium pending  Review of Systems       Patient denies fever, chills, headache, sweats, rash, change in vision, change in hearing, chest pain, cough, nausea vomiting,  urinary symptoms.  All other systems are reviewed and are negative  Vital Signs:  Patient profile:   55 year old male Height:      73 inches Weight:      210 pounds BMI:     27.81 Pulse rate:   65 / minute BP sitting:   98 / 50  (left arm) Cuff size:   regular  Vitals Entered By: Hardin Negus, RMA (October 25, 2009 8:58 AM)  Physical Exam  General:  patient is stable. Eyes:  no xanthelasma. Neck:  no jugular venous distention. Lungs:  lungs are clear.  Respiratory effort is nonlabored. Heart:  cardiac exam reveals an S1-S2.  No clicks or significant murmurs. Abdomen:  abdomen is soft. Extremities:  no peripheral edema. Psych:  patient is oriented to person time and place.  Affect is normal.   Impression & Recommendations:  Problem # 1:  HYPERKALEMIA (ICD-276.7) potassium was 4.8 when most recently checked.  I cannot use spironolactone at this time.  I will consider getting the future.  Problem # 2:  * EJECTION FRACTION 25% The patient is on optimal medications at this time.  I will see him back in 3 months and consider followup echo at that point.  Problem # 3:  RENAL INSUFFICIENCY (ICD-588.9) See the complete evaluation by Dr. Allena Umeka Wrench.  All of the studies mentioned in the consult note will be followed up by Dr. Allena Briellah Baik and Dr.Paz.  Problem # 4:  INCREASED BLOOD PRESSURE (ICD-796.2) Blood pressure is on the low side today and we cannot push his medicines any further.  Problem # 5:  CORONARY ARTERY DISEASE (ICD-414.00)  His updated medication list for this problem includes:    Lotensin 20 Mg Tabs (Benazepril hcl) .Marland Kitchen... 1/2 by mouth once daily    Bayer Aspirin 325 Mg Tabs (Aspirin) .Marland Kitchen... Take 1 tablet by mouth once a day    Carvedilol 25 Mg Tabs (Carvedilol) .Marland Kitchen... Take one tablet by mouth twice a day CAD is stable at this time.  Problem # 6:  HYPOTHYROIDISM (ICD-244.9)  His updated medication list for this problem includes:    Synthroid 88 Mcg Tabs (Levothyroxine  sodium) ..... One by mouth daily Thyroid is treated.  Patient Instructions: 1)  Follow up in 3 months

## 2010-02-20 NOTE — Assessment & Plan Note (Signed)
Summary: bp check  Nurse Visit   Vital Signs:  Patient profile:   55 year old male Weight:      210 pounds Pulse rate:   62 / minute Pulse rhythm:   regular BP sitting:   116 / 82  (left arm) Cuff size:   large  Vitals Entered By: Army Fossa CMA (September 08, 2009 11:28 AM)  Impression & Recommendations:  Problem # 1:  RENAL INSUFFICIENCY (ICD-588.9) beta blockers were increased, Lasix decreased.  BP stable.  We'll check renal function today  Complete Medication List: 1)  Amaryl 2 Mg Tabs (Glimepiride) .... Daily 2)  Glucophage 850 Mg Tabs (Metformin hcl) .Marland Kitchen.. 1 by mouth tid 3)  Digitek 0.125 Mg Tabs (Digoxin) .Marland Kitchen.. 1 by mouth qd 4)  Lotensin 20 Mg Tabs (Benazepril hcl) .... 1/2 by mouth once daily 5)  Furosemide 40 Mg Tabs (Furosemide) .Marland Kitchen.. 1 by mouth daily. 6)  Synthroid 88 Mcg Tabs (Levothyroxine sodium) .... One by mouth daily 7)  Zocor 20 Mg Tabs (Simvastatin) .Marland Kitchen.. 1 by mouth at bedtime. 8)  Bayer Aspirin 325 Mg Tabs (Aspirin) .... Take 1 tablet by mouth once a day 9)  Carvedilol 25 Mg Tabs (Carvedilol) .... Take one tablet by mouth twice a day  Other Orders: No Charge Patient Arrived (NCPA0) (NCPA0)   Current Medications (verified): 1)  Amaryl 2 Mg Tabs (Glimepiride) .... Daily 2)  Glucophage 850 Mg Tabs (Metformin Hcl) .Marland Kitchen.. 1 By Mouth Tid 3)  Digitek 0.125 Mg Tabs (Digoxin) .Marland Kitchen.. 1 By Mouth Qd 4)  Lotensin 20 Mg Tabs (Benazepril Hcl) .... 1/2 By Mouth Once Daily 5)  Furosemide 40 Mg Tabs (Furosemide) .Marland Kitchen.. 1 By Mouth Daily. 6)  Synthroid 88 Mcg Tabs (Levothyroxine Sodium) .... One By Mouth Daily 7)  Zocor 20 Mg Tabs (Simvastatin) .Marland Kitchen.. 1 By Mouth At Bedtime. 8)  Bayer Aspirin 325 Mg Tabs (Aspirin) .... Take 1 Tablet By Mouth Once A Day 9)  Carvedilol 25 Mg Tabs (Carvedilol) .... Take One Tablet By Mouth Twice A Day  Allergies (verified): No Known Drug Allergies  Orders Added: 1)  No Charge Patient Arrived (NCPA0) [NCPA0]

## 2010-02-20 NOTE — Miscellaneous (Signed)
  Clinical Lists Changes  Observations: Added new observation of ECHOINTERP:   - Left ventricle: The cavity size was mildly dilated. Wall thickness       was normal. Systolic function was severely reduced. The estimated       ejection fraction was in the range of 20% to 25%. Diffuse       hypokinesis. There is akinesis and scarring of the basal-mid       inferolateral myocardium. There is dyskinesis and aneurysmal       deformity of the apical myocardium. There is akinesis of the       entire inferior myocardium. Doppler parameters are consistent with       abnormal left ventricular relaxation (grade 1 diastolic       dysfunction).     - Mitral valve: Mildly calcified annulus.     - Left atrium: The atrium was mildly dilated. (06/14/2009 9:43)      Echocardiogram  Procedure date:  06/14/2009  Findings:        - Left ventricle: The cavity size was mildly dilated. Wall thickness       was normal. Systolic function was severely reduced. The estimated       ejection fraction was in the range of 20% to 25%. Diffuse       hypokinesis. There is akinesis and scarring of the basal-mid       inferolateral myocardium. There is dyskinesis and aneurysmal       deformity of the apical myocardium. There is akinesis of the       entire inferior myocardium. Doppler parameters are consistent with       abnormal left ventricular relaxation (grade 1 diastolic       dysfunction).     - Mitral valve: Mildly calcified annulus.     - Left atrium: The atrium was mildly dilated.

## 2010-02-20 NOTE — Letter (Signed)
Summary: [facility]ODGKIN [facility]   Imported By: Freddy Jaksch 09/04/2006 16:37:53  _____________________________________________________________________  External Attachment:    Type:   Image     Comment:   External Document

## 2010-02-20 NOTE — Progress Notes (Signed)
Summary: med clarification  Phone Note Call from Patient   Caller: Patient Summary of Call: patient called back to let us know that he is taking Simvastatin 80mg  1/2 tab daily and Carvediolol 25mg  two times a day which was increased to that dose at his last ov w/Dr [name]  Initial call taken by: Meredith Staggers, RN,  August 31, 2009 3:01 PM  Follow-up for Phone Call        Good  Talitha Givens, MD, [facility]  September 05, 2009 1:39 PM  left mess for pt to cont. current meds Meredith Staggers, RN  September 05, 2009 4:02 PM

## 2010-02-20 NOTE — Assessment & Plan Note (Signed)
Summary: 4 mth fu/ns/kdc   Vital Signs:  Patient profile:   55 year old male Height:      73 inches Weight:      202 pounds BMI:     26.75 Pulse rate:   62 / minute Pulse rhythm:   regular BP sitting:   128 / 80  (left arm) Cuff size:   large  Vitals Entered By: Shary Decamp (February 10, 2009 9:40 AM) CC: rov - fasting Is Patient Diabetic? Yes   History of Present Illness: routine office visit Congestive heart failure --has been taking less Lasix than  prescribed, was just confused   Diabetes--patient stopped checking sugars  a while  back because all his CBGs were within normal    Current Medications (verified): 1)  Amaryl 4 Mg Tabs (Glimepiride) .... 1/2  By Mouth Qam; 2)  Glucophage 850 Mg Tabs (Metformin Hcl) .Marland Kitchen.. 1 By Mouth Tid 3)  Digitek 0.125 Mg Tabs (Digoxin) .Marland Kitchen.. 1 By Mouth Qd 4)  Lotensin 20 Mg Tabs (Benazepril Hcl) .... 1/2 By Mouth Once Daily 5)  Metoprolol Tartrate 50 Mg Tabs (Metoprolol Tartrate) .... 1/2 By Mouth Bid 6)  Lasix 20 Mg Tabs (Furosemide) .... 1/2 By Mouth Once Daily 7)  Synthroid 75 Mcg Tabs (Levothyroxine Sodium) .Marland Kitchen.. 1 By Mouth Once Daily 8)  Simvastatin 80 Mg Tabs (Simvastatin) .Marland Kitchen.. 1 At Night 9)  Bayer Aspirin 325 Mg Tabs (Aspirin) .... Take 1 Tablet By Mouth Once A Day 10)  Viagra 100 Mg Tabs (Sildenafil Citrate) .... 1/2 To 1 By Mouth Once Daily As Needed  Allergies (verified): No Known Drug Allergies  Past History:  Past Medical History: Congestive heart failure (EF 35-45% per ECHO 2005) CAD--CABG 2003 Hyperlipidemia Diabetes mellitus, type II Hypothyroidism Hx of ABUSE, COCAINE, ALCOHOL, ETC  ATRIAL FIBRILLATION, HX OF  HX, PERSONAL, HODGKIN'S DISEASE--Dx aprox 2004, s/p XRT-Chemo  Past Surgical History: Reviewed history from 09/08/2008 and no changes required. Tonsillectomy Coronary artery bypass graft (2003) cyst removed from upper back   Social History: Reviewed history from 09/08/2008 and no changes  required. 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   Neck:  no JVD Lungs:  normal respiratory effort, no intercostal retractions, no accessory muscle use, and normal breath sounds.   Heart:  normal rate, regular rhythm, and no murmur.   Extremities:  no edema   Impression & Recommendations:  Problem # 1:  DIABETES MELLITUS, TYPE II (ICD-250.00) Labs recommend to check his sugars from time to time  , one or two times a week   His updated medication list for this problem includes:    Amaryl 4 Mg Tabs (Glimepiride) .Marland Kitchen... 1/2  by mouth qam;    Glucophage 850 Mg Tabs (Metformin hcl) .Marland Kitchen... 1 by mouth tid    Lotensin 20 Mg Tabs (Benazepril hcl) .Marland Kitchen... 1/2 by mouth once daily    Bayer Aspirin 325 Mg Tabs (Aspirin) .Marland Kitchen... Take 1 tablet by mouth once a day  Orders: Venipuncture (16109) TLB-A1C / Hgb A1C (Glycohemoglobin) (83036-A1C)  Problem # 2:  CONGESTIVE HEART FAILURE (ICD-428.0) taking less Lasix  than prescribed, history of CHF recommend to go back to one tablet of Lasix once daily  he has gained weight, no symptoms of vol. overload, wt.  again may be due to diet or hypothyroidism, seen next  His updated medication list for this problem includes:    Digitek 0.125 Mg Tabs (Digoxin) .Marland Kitchen... 1 by mouth qd    Lotensin 20 Mg Tabs (Benazepril hcl) .Marland Kitchen... 1/2 by mouth once daily    Metoprolol Tartrate 50 Mg Tabs (Metoprolol tartrate) .Marland Kitchen... 1/2 by mouth bid    Lasix 20 Mg Tabs (Furosemide) .Marland Kitchen... 1 by mouth once daily    Bayer Aspirin 325 Mg Tabs (Aspirin) .Marland Kitchen... Take 1 tablet by mouth once a  day  Orders: TLB-Digoxin (Lanoxin) (80162-DIG)  Problem # 3:  HYPOTHYROIDISM (ICD-244.9) labs last TSH in the high side, adjust medication which results in  synthroid 75 Mcg Tabs (Levothyroxine sodium) .Marland Kitchen... 1 by mouth once daily    Labs Reviewed: TSH: 5.15 (09/08/2008)    HgBA1c: 5.7 (09/08/2008) Chol: 121 (09/08/2008)   HDL: 30.30 (09/08/2008)   LDL: 56 (09/08/2008)   TG: 174.0 (09/08/2008)  Orders: TLB-TSH (Thyroid Stimulating Hormone) (84443-TSH)  Complete Medication List: 1)  Amaryl 4 Mg Tabs (Glimepiride) .... 1/2  by mouth qam; 2)  Glucophage 850 Mg Tabs (Metformin hcl) .Marland Kitchen.. 1 by mouth tid 3)  Digitek 0.125 Mg Tabs (Digoxin) .Marland Kitchen.. 1 by mouth qd 4)  Lotensin 20 Mg Tabs (Benazepril hcl) .... 1/2 by mouth once daily 5)  Metoprolol Tartrate 50 Mg Tabs (Metoprolol tartrate) .... 1/2 by mouth bid 6)  Lasix 20 Mg Tabs (Furosemide) .Marland Kitchen.. 1 by mouth once daily 7)  Synthroid 75 Mcg Tabs (Levothyroxine sodium) .Marland Kitchen.. 1 by mouth once daily 8)  Simvastatin 80 Mg Tabs (Simvastatin) .Marland Kitchen.. 1 at night 9)  Bayer Aspirin 325 Mg Tabs (Aspirin) .... Take 1 tablet by mouth once a day 10)  Viagra 100 Mg Tabs (Sildenafil citrate) .... 1/2 to 1 by mouth once daily as needed  Patient Instructions: 1)  check your sugars once or twice a week 2)  go back to one tablet of  Lasix daily 3)  come back in 3 months   Influenza Immunization History:    Influenza # 1:  declined, explained benefits (02/10/2009)

## 2010-02-20 NOTE — Assessment & Plan Note (Signed)
Summary: 3 MONTH FOLLOWUP///SPH   Vital Signs:  Patient profile:   55 year old male Height:      73 inches Weight:      206 pounds Pulse rate:   74 / minute BP sitting:   120 / 70  Vitals Entered By: Shary Decamp (May 17, 2009 9:09 AM) \CC: rov, fasting   History of Present Illness: routine office visit not eating  as well as he would like CBGs varies depending on his diet.  if his diet is good his morning CBG is around 100 otherwise it  can go up to 160 he hardly  eats lunch, the heavier meal is dinner  Current Medications (verified): 1)  Amaryl 4 Mg Tabs (Glimepiride) .... 1/2  By Mouth Qam; 2)  Glucophage 850 Mg Tabs (Metformin Hcl) .Marland Kitchen.. 1 By Mouth Tid 3)  Digitek 0.125 Mg Tabs (Digoxin) .Marland Kitchen.. 1 By Mouth Qd 4)  Lotensin 20 Mg Tabs (Benazepril Hcl) .... 1/2 By Mouth Once Daily 5)  Metoprolol Tartrate 50 Mg Tabs (Metoprolol Tartrate) .... 1/2 By Mouth Bid 6)  Lasix 20 Mg Tabs (Furosemide) .Marland Kitchen.. 1 By Mouth Once Daily 7)  Synthroid 75 Mcg Tabs (Levothyroxine Sodium) .Marland Kitchen.. 1 By Mouth Once Daily 8)  Simvastatin 80 Mg Tabs (Simvastatin) .Marland Kitchen.. 1 At Night 9)  Bayer Aspirin 325 Mg Tabs (Aspirin) .... Take 1 Tablet By Mouth Once A Day  Allergies (verified): No Known Drug Allergies  Past History:  Past Medical History: Congestive heart failure (EF 35-45% per ECHO 2005) CAD--CABG 2003 Hyperlipidemia Diabetes mellitus, type II Hypothyroidism Hx of ABUSE, COCAINE, ALCOHOL, ETC  ATRIAL FIBRILLATION, HX OF  HX, PERSONAL, HODGKIN'S DISEASE--Dx aprox 2004, s/p XRT-Chemo  Past Surgical History: Reviewed history from 09/08/2008 and no changes required. Tonsillectomy Coronary artery bypass graft (2003) cyst removed from upper back   Social History: Reviewed history from 09/08/2008 and no changes required. 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   Lungs:  normal respiratory effort, no intercostal retractions, no accessory muscle use, and normal breath sounds.   Heart:  normal rate, regular rhythm, and no murmur.   Extremities:  no edema   Impression & Recommendations:  Problem # 1:  DIABETES MELLITUS, TYPE II (ICD-250.00) we discuss his diet, recommend to balance his meals through the day.  His updated medication list for this problem includes:    Amaryl 4 Mg Tabs (Glimepiride) .Marland Kitchen... 1/2  by mouth qam;    Glucophage 850 Mg Tabs (Metformin hcl) .Marland Kitchen... 1 by mouth tid    Lotensin 20 Mg Tabs (Benazepril hcl) .Marland Kitchen... 1/2 by mouth once daily    Bayer Aspirin 325 Mg Tabs (Aspirin) .Marland Kitchen... Take 1 tablet by mouth once a day  Orders: TLB-A1C / Hgb A1C (Glycohemoglobin) (83036-A1C)  Problem # 2:  CORONARY ARTERY DISEASE (ICD-414.00) asymptomatic His updated medication list for this problem includes:    Lotensin 20 Mg Tabs (Benazepril hcl) .Marland Kitchen... 1/2 by mouth once daily    Metoprolol Tartrate 50 Mg Tabs (Metoprolol tartrate) .Marland Kitchen... 1/2 by mouth bid    Lasix 20 Mg Tabs (Furosemide) .Marland Kitchen... 1 by mouth once daily    Bayer Aspirin 325 Mg  Tabs (Aspirin) .Marland Kitchen... Take 1 tablet by mouth once a day  Problem # 3:  CONGESTIVE HEART FAILURE (ICD-428.0) has gained weight but no s/s  of volume overload last digoxin level in the low side but symptoms well-controlled.  No change His updated medication list for this problem includes:    Digitek 0.125 Mg Tabs (Digoxin) .Marland Kitchen... 1 by mouth qd    Lotensin 20 Mg Tabs (Benazepril hcl) .Marland Kitchen... 1/2 by mouth once daily    Metoprolol Tartrate 50 Mg Tabs (Metoprolol tartrate) .Marland Kitchen... 1/2 by mouth bid    Lasix 20 Mg Tabs (Furosemide) .Marland Kitchen... 1 by mouth once daily    Bayer Aspirin 325 Mg Tabs (Aspirin) .Marland Kitchen... Take 1 tablet by mouth once a  day  Orders: TLB-BMP (Basic Metabolic Panel-BMET) (80048-METABOL)  Problem # 4:  HYPOTHYROIDISM (ICD-244.9) last TSH slightly elevated, will increase the dose of Synthroid to help lose weight although the problem of  gaining weight is apparently  diet  His updated medication list for this problem includes:    Synthroid 88 Mcg Tabs (Levothyroxine sodium) ..... One by mouth daily  Labs Reviewed: TSH: 4.20 (02/10/2009)    HgBA1c: 6.3 (02/10/2009) Chol: 121 (09/08/2008)   HDL: 30.30 (09/08/2008)   LDL: 56 (09/08/2008)   TG: 174.0 (09/08/2008)  Complete Medication List: 1)  Amaryl 4 Mg Tabs (Glimepiride) .... 1/2  by mouth qam; 2)  Glucophage 850 Mg Tabs (Metformin hcl) .Marland Kitchen.. 1 by mouth tid 3)  Digitek 0.125 Mg Tabs (Digoxin) .Marland Kitchen.. 1 by mouth qd 4)  Lotensin 20 Mg Tabs (Benazepril hcl) .... 1/2 by mouth once daily 5)  Metoprolol Tartrate 50 Mg Tabs (Metoprolol tartrate) .... 1/2 by mouth bid 6)  Lasix 20 Mg Tabs (Furosemide) .Marland Kitchen.. 1 by mouth once daily 7)  Synthroid 88 Mcg Tabs (Levothyroxine sodium) .... One by mouth daily 8)  Simvastatin 80 Mg Tabs (Simvastatin) .Marland Kitchen.. 1 at night 9)  Bayer Aspirin 325 Mg Tabs (Aspirin) .... Take 1 tablet by mouth once a day  Other Orders: Venipuncture (14782) TLB-ALT (SGPT) (84460-ALT) TLB-AST (SGOT) (84450-SGOT)  Patient Instructions: 1)  Please schedule a follow-up appointment in 3 months .  Prescriptions: SYNTHROID 88 MCG TABS (LEVOTHYROXINE SODIUM) one by mouth daily  #30 x 6   Entered and Authorized by:   Elita Quick E. Jacie Tristan MD   Signed by:   Nolon Rod. Thomasena Vandenheuvel MD on 05/17/2009   Method used:   Print then Give to Patient   RxID:   [id number] LOTENSIN 20 MG TABS (BENAZEPRIL HCL) 1/2 by mouth once daily  #30 Tablet x 3   Entered by:   Shary Decamp   Authorized by:   Nolon Rod. Datrell Dunton MD   Signed by:   Shary Decamp on 05/17/2009   Method used:   Electronically to        [facility]       [address]       [place], [place]  [address]       Ph: [phone]       Fax: [phone]   RxID:   [id number]

## 2010-02-20 NOTE — Progress Notes (Signed)
Summary: appt 02/24/07  ---- Converted from flag ---- ---- 01/19/2007 1:44 PM, Charolette Child wrote: his apt is 2.3.09 @ 8:45 pt needed to have cpx as well so i just scheduled the apt for a cpx  ---- 01/19/2007 9:56 AM, Shary Decamp wrote: needs office visit 2/09 ------------------------------

## 2010-02-20 NOTE — Miscellaneous (Signed)
  Clinical Lists Changes  Observations: Added new observation of PAST MED HX: EF  40% range CAD- CABG 2003 Hyperlipidemia Diabetes mellitus, type II Hypothyroidism ABUSE, COCAINE, ALCOHOL, ETC ...resolved for many years ATRIAL FIBRILLATION, HX OF  HODGKIN'S DISEASE--Dx aprox 2004, s/p XRT-Chemo IVCD   (05/24/2009 8:58)       Past History:  Past Medical History: EF  40% range CAD- CABG 2003 Hyperlipidemia Diabetes mellitus, type II Hypothyroidism ABUSE, COCAINE, ALCOHOL, ETC ...resolved for many years ATRIAL FIBRILLATION, HX OF  HODGKIN'S DISEASE--Dx aprox 2004, s/p XRT-Chemo IVCD

## 2010-02-20 NOTE — Consult Note (Signed)
Summary: [facility]  [facility]   Imported By: Lanelle Bal 10/04/2009 11:37:58  _____________________________________________________________________  External Attachment:    Type:   Image     Comment:   External Document

## 2010-02-20 NOTE — Assessment & Plan Note (Signed)
Summary: 4wk f/u sl  Medications Added CARVEDILOL 25 MG TABS (CARVEDILOL) Take one tablet by mouth twice a day      Allergies Added: NKDA  Visit Type:  4 weeks follow up Primary Provider:  Nolon Rod. Paz MD  CC:  CAD.  History of Present Illness: Patient is seen for followup of coronary artery disease.  I saw him last June 23, 2009.  His echo had shown worsening left ventricular function.  At that point we began to push his medications for left ventricular dysfunction.  His beta blocker was changed to carvedilol and he was titrated up to 12.5 b.i.d.  He is tolerating this well.  No chest pain or shortness of breath.  No presyncope.  Current Medications (verified): 1)  Amaryl 4 Mg Tabs (Glimepiride) .... 1/2  By Mouth Qam; 2)  Glucophage 850 Mg Tabs (Metformin Hcl) .Marland Kitchen.. 1 By Mouth Tid 3)  Digitek 0.125 Mg Tabs (Digoxin) .Marland Kitchen.. 1 By Mouth Qd 4)  Lotensin 20 Mg Tabs (Benazepril Hcl) .... 1/2 By Mouth Once Daily 5)  Furosemide 40 Mg Tabs (Furosemide) .... Take One Tablet By Mouth Daily. 6)  Synthroid 88 Mcg Tabs (Levothyroxine Sodium) .... One By Mouth Daily 7)  Simvastatin 80 Mg Tabs (Simvastatin) .... 1/2 Tab At Night 8)  Bayer Aspirin 325 Mg Tabs (Aspirin) .... Take 1 Tablet By Mouth Once A Day 9)  Carvedilol 12.5 Mg Tabs (Carvedilol) .... Take 6.25mg  of Carvedilol Twice A Day For 1 Week Then Increase To Caverdilol 12.5mg  Twice Daily For 3 Weeks  Allergies (verified): No Known Drug Allergies  Past History:  Past Medical History: EF  40% range /   EF 20-25%...echo..06/14/2009.Marland Kitchen CAD- CABG 2003 Hyperlipidemia...low HDL Diabetes mellitus, type II Hypothyroidism ABUSE, COCAINE, ALCOHOL, ETC ...resolved for many years ATRIAL FIBRILLATION, HX OF  HODGKIN'S DISEASE--Dx aprox 2004, s/p XRT-Chemo IVCD Creatinine... up to 1.5.Marland KitchenMarland KitchenMarland Kitchen 2011  Review of Systems       Patient denies fever, chills, headache, sweats, rash, change in vision, change in hearing, chest pain, cough, nausea vomiting,  urinary symptoms.  All other systems are reviewed and are negative.  Vital Signs:  Patient profile:   55 year old male Height:      73 inches Weight:      209.75 pounds BMI:     27.77 Pulse rate:   68 / minute Pulse rhythm:   regular Resp:     18 per minute BP sitting:   100 / 60  (left arm) Cuff size:   large  Vitals Entered By: Vikki Ports (July 20, 2009 9:52 AM)  Physical Exam  General:  patient is stable. Eyes:  no xanthelasma or Neck:  no jugular venous distention Lungs:  lungs are clear.  Respiratory effort is nonlabored. Heart:  cardiac exam reveals S1 and S2.  No clicks or significant murmurs. Abdomen:  abdomen is soft. Extremities:  no peripheral edema. Psych:  patient is oriented to person time and place.  Affect is normal.   Impression & Recommendations:  Problem # 1:  * EJECTION FRACTION 25% I will continue to titrate the patient's medications.  He will be increased to 18 mg of carvedilol b.i.d. and then 25 b.i.d.  After that I will see him back.  I decreased his furosemide dose from 40-20 mg daily his creatinine had gone up from 1.2-1.5.  The next step would be to consider repeat renal function and the addition of Aldactone.  Over time we will consider repeat echo and then the  possibility of need for repeat catheterization.  I have explained to the patient at length the rationale for the approach that we are taking at this time and he understands.  Problem # 2:  HYPERLIPIDEMIA (ICD-272.4)  His updated medication list for this problem includes:    Simvastatin 80 Mg Tabs (Simvastatin) .Marland Kitchen... 1/2 tab at night Lipids are being treated.  Problem # 3:  CORONARY ARTERY DISEASE (ICD-414.00)  His updated medication list for this problem includes:    Lotensin 20 Mg Tabs (Benazepril hcl) .Marland Kitchen... 1/2 by mouth once daily    Bayer Aspirin 325 Mg Tabs (Aspirin) .Marland Kitchen... Take 1 tablet by mouth once a day    Carvedilol 25 Mg Tabs (Carvedilol) .Marland Kitchen... Take one tablet by mouth twice a  day exudate he is stable at this time.  Patient Instructions: 1)  Increase Carvedilol to 1 & 1/2 tabs twice a day for 2 weeks then increase to 25mg  two times a day  2)  Follow up in 5 weeks Prescriptions: CARVEDILOL 25 MG TABS (CARVEDILOL) Take one tablet by mouth twice a day  #60 x 6   Entered by:   Meredith Staggers, RN   Authorized by:   Talitha Givens, MD, [facility]   Signed by:   Meredith Staggers, RN on 07/20/2009   Method used:   Electronically to        [facility]       [address]       [place], [place]  [address]       Ph: [phone]       Fax: [phone]   RxID:   [id number]

## 2010-02-20 NOTE — Progress Notes (Signed)
Summary: A1c 6.3  Phone Note Outgoing Call   Summary of Call: his A1c on 12-21-09 was 6.3 AST, ALT normal  advise patient:  diabetes well controlled Please come back and get  a TSH at his convenience [place]  E. Paz MD  December 27, 2009 1:04 PM    Follow-up for Phone Call        Pt is aware. He will call and make and an appt. Army Fossa CMA  December 27, 2009 1:22 PM

## 2010-02-20 NOTE — Letter (Signed)
Summary: [facility]: Immunoassay Fecal Occult Blood (iFOB) Order Form  [facility] [place]  [address] [place], [place] [address]   Phone: [phone]  Fax: [phone]      [facility]: Immunoassay Fecal Occult Blood (iFOB) Order Form   September 09, 2008 MRN: [id number]   Caleb Bell May 01, 1955   Physicican Name:________paz________________  Diagnosis Code:______v76.51___________________      Caleb Bell

## 2010-02-20 NOTE — Assessment & Plan Note (Signed)
Summary: 5wk f/u sl   Visit Type:  Follow-up  CC:  ischemic cardiomyopathy.  Current Medications (verified): 1)  Amaryl 2 Mg Tabs (Glimepiride) .... Daily 2)  Glucophage 850 Mg Tabs (Metformin Hcl) .Marland Kitchen.. 1 By Mouth Tid 3)  Digitek 0.125 Mg Tabs (Digoxin) .Marland Kitchen.. 1 By Mouth Qd 4)  Lotensin 20 Mg Tabs (Benazepril Hcl) .... 1/2 By Mouth Once Daily 5)  Furosemide 20 Mg Tabs (Furosemide) .... Take One Tab By Mouth Daily 6)  Synthroid 88 Mcg Tabs (Levothyroxine Sodium) .... One By Mouth Daily 7)  Zocor 20 Mg Tabs (Simvastatin) .Marland Kitchen.. 1 By Mouth At Bedtime. 8)  Bayer Aspirin 325 Mg Tabs (Aspirin) .... Take 1 Tablet By Mouth Once A Day 9)  Carvedilol 25 Mg Tabs (Carvedilol) .... Take One Tablet By Mouth Twice A Day  Allergies (verified): No Known Drug Allergies  Past History:  Past Medical History: EF  40% range /   EF 20-25%...echo..06/14/2009.Marland Kitchen CAD- CABG 2003 Hyperlipidemia...low HDL Diabetes mellitus, type II Hypothyroidism ABUSE, COCAINE, ALCOHOL, ETC ...resolved for many years ATRIAL FIBRILLATION, HX OF  HODGKIN'S DISEASE--Dx aprox 2004, s/p XRT-Chemo IVCD Creatinine... up to 1.5.Marland KitchenMarland KitchenMarland Kitchen 2011 Hyperkalemia   .... August, 2011... repeat potassium pending  Review of Systems       Patient denies fever, chills, headache, sweats, rash, change in vision, change in hearing, chest pain, cough, nausea vomiting, urinary symptoms.  All other systems are reviewed and are negative.  Vital Signs:  Patient profile:   55 year old male Height:      73 inches Weight:      209 pounds BMI:     27.67 Pulse rate:   62 / minute BP sitting:   94 / 58  (left arm)  Vitals Entered By: Laurance Flatten CMA (August 31, 2009 9:33 AM)  Physical Exam  General:  patient is stable. Eyes:  no xanthelasma. Neck:  no jugular venous distention. Lungs:  lungs are clear.  Respiratory effort is nonlabored. Heart:  cardiac exam reveals S1-S2.  No clicks or significant murmurs. Abdomen:  abdomen is soft. Extremities:   no peripheral edema. Psych:  patient is oriented to person time and place.  Affect is normal.   Impression & Recommendations:  Problem # 1:  * EJECTION FRACTION 25% The patient's carvedilol has now been titrated up to 25 mg b.i.d.  He will call us to reverify this dose.  His heart rate is well controlled and his blood pressure is relatively low.  I will keep him on the current dose of beta blocker and we will verify the exact dose.  Problem # 2:  INCREASED BLOOD PRESSURE (ICD-796.2) On the current meds the patient's blood pressure is on the low side.  No further changes.  He has no symptoms.  Problem # 3:  HYPERKALEMIA (ICD-276.7) The patient's potassium was 5.5 .  His meds were adjusted with slight increase in diuretic.  He is to have followup lab.  I will decide over time if he needs to stay on this higher dose of diuretic.  I cannot consider Aldactone at this time.  No other changes in his meds today.  Problem # 4:  HYPERLIPIDEMIA (ICD-272.4)  His updated medication list for this problem includes:    Zocor 20 Mg Tabs (Simvastatin) .Marland Kitchen... 1 by mouth at bedtime. The patient's labs checked recently revealed triglyceride 351 HDL 32 and LDL 96.  He was notified to work more diligently with his diet.  We need to reverify his simvastatin dose and  he will call back today.  I want him on at least 40 mg daily.  Other Orders: EKG w/ Interpretation (93000)  Patient Instructions: 1)  Please give me a call back this afternoon with the dose of your simvastatin and carvedilol, [phone] ask for Heather S. 2)  Follow up in 8 weeks

## 2010-02-20 NOTE — Assessment & Plan Note (Signed)
Summary: roa 4 months.cbs   Vital Signs:  Patient Profile:   55 Years Old Male Height:     73 inches Weight:      217.6 pounds Pulse rate:   60 / minute BP sitting:   110 / 60  Vitals Entered By: Shary Decamp (September 11, 2007 9:49 AM)                 Chief Complaint:  rov fasting; pt did not start Niapspan $$$.  History of Present Illness: rov fasting; pt did not start Niapspan $$$    Updated Prior Medication List: GLUCOPHAGE 850 MG TABS (METFORMIN HCL) 1 by mouth tid PRAVASTATIN SODIUM 40 MG TABS (PRAVASTATIN SODIUM) Take 2 tablet by mouth at bedtime LOTENSIN 20 MG TABS (BENAZEPRIL HCL) 1 by mouth qd METOPROLOL TARTRATE 50 MG TABS (METOPROLOL TARTRATE) 1/2 by mouth bid LASIX 40 MG TABS (FUROSEMIDE) 1 by mouth qd BAYER ASPIRIN 325 MG TABS (ASPIRIN) Take 1 tablet by mouth once a day SYNTHROID 100 MCG TABS (LEVOTHYROXINE SODIUM) 1 by mouth once daily AMARYL 4 MG TABS (GLIMEPIRIDE) 1 by mouth two times a day DIGITEK 0.125 MG TABS (DIGOXIN) 1 by mouth qd  Current Allergies (reviewed today): No known allergies   Past Medical History:    Reviewed history from 05/12/2007 and no changes required:       Congestive heart failure (EF 35-45% per ECHO 2005)       CAD--CABG 2003       Diabetes mellitus, type II       Hypothyroidism       Hx of ABUSE, COCAINE, ALCOHOL, ETC        ATRIAL FIBRILLATION, HX OF (ICD-V12.59)       HX, PERSONAL, HODGKIN'S DISEASE--Dx aprox 2004, s/p XRT-Chemo       Hyperlipidemia  Past Surgical History:    Reviewed history from 02/15/2006 and no changes required:       Tonsillectomy       Coronary artery bypass graft (2003)     Review of Systems       diet better walks 5 x week  CV      Denies chest pain or discomfort and swelling of feet.  Resp      Denies shortness of breath.  Endo      CBGs--90s in AM, occ spike   Physical Exam  General:     alert and well-developed.   Lungs:     normal respiratory effort, no intercostal  retractions, no accessory muscle use, and normal breath sounds.   Heart:     normal rate, regular rhythm, and no murmur.   Extremities:     no pretibial edema bilaterally     Impression & Recommendations:  Problem # 1:  doing well cost of meds-labs  is a issue  Problem # 2:  DIABETES MELLITUS, TYPE II (ICD-250.00) prefers not to have a A1C unless "really needed" his CBGs are stable labs on RTC His updated medication list for this problem includes:    Glucophage 850 Mg Tabs (Metformin hcl) .Marland Kitchen... 1 by mouth tid    Lotensin 20 Mg Tabs (Benazepril hcl) .Marland Kitchen... 1 by mouth qd    Bayer Aspirin 325 Mg Tabs (Aspirin) .Marland Kitchen... Take 1 tablet by mouth once a day    Amaryl 4 Mg Tabs (Glimepiride) .Marland Kitchen... 1 by mouth two times a day  Labs Reviewed: HgBA1c: 6.8 (05/12/2007)   Creat: 1.2 (05/12/2007)   Microalbumin: 4.4 (05/12/2007)   Problem #  3:  HYPERLIPIDEMIA (ICD-272.4) FLP not at goal has improve diet unable to afford niaspan: will try zocor, see instructions  The following medications were removed from the medication list:    Niaspan 500 Mg Tbcr (Niacin (antihyperlipidemic)) .Marland Kitchen... 1 by mouth x 1wk then increase to 2 tablets at night  His updated medication list for this problem includes:    Pravastatin Sodium 40 Mg Tabs (Pravastatin sodium) .Marland Kitchen... Take 2 tablet by mouth at bedtime    Simvastatin 80 Mg Tabs (Simvastatin) .Marland Kitchen... 1 at night  Labs Reviewed: Chol: 176 (05/12/2007)   HDL: 30.0 (05/12/2007)   LDL: DEL (05/12/2007)   TG: 339 (05/12/2007) SGOT: 21 (05/12/2007)   SGPT: 23 (05/12/2007)   Complete Medication List: 1)  Glucophage 850 Mg Tabs (Metformin hcl) .Marland Kitchen.. 1 by mouth tid 2)  Pravastatin Sodium 40 Mg Tabs (Pravastatin sodium) .... Take 2 tablet by mouth at bedtime 3)  Lotensin 20 Mg Tabs (Benazepril hcl) .Marland Kitchen.. 1 by mouth qd 4)  Metoprolol Tartrate 50 Mg Tabs (Metoprolol tartrate) .... 1/2 by mouth bid 5)  Lasix 40 Mg Tabs (Furosemide) .Marland Kitchen.. 1 by mouth qd 6)  Bayer Aspirin 325 Mg  Tabs (Aspirin) .... Take 1 tablet by mouth once a day 7)  Synthroid 100 Mcg Tabs (Levothyroxine sodium) .Marland Kitchen.. 1 by mouth once daily 8)  Amaryl 4 Mg Tabs (Glimepiride) .Marland Kitchen.. 1 by mouth two times a day 9)  Digitek 0.125 Mg Tabs (Digoxin) .Marland Kitchen.. 1 by mouth qd 10)  Simvastatin 80 Mg Tabs (Simvastatin) .Marland Kitchen.. 1 at night   Patient Instructions: 1)  if you are able to get simvastatin, then start taking it and discontinue pravastatin 2)  call if you have side effects 3)  Please schedule a follow-up appointment in 3 months.   Prescriptions: SIMVASTATIN 80 MG TABS (SIMVASTATIN) 1 at night  #30 x 3   Entered and Authorized by:   Elita Quick E. Isabelly Kobler MD   Signed by:   Nolon Rod. Govanni Plemons MD on 09/11/2007   Method used:   Print then Give to Patient   RxID:   [id number]  ]

## 2010-02-20 NOTE — Assessment & Plan Note (Signed)
Summary: sun burn maybe sun poison//tl   Vital Signs:  Patient Profile:   55 Years Old Male Weight:      217.2 pounds Temp:     97.4 degrees F oral BP sitting:   120 / 80  (left arm)  Vitals Entered By: Shary Decamp (July 29, 2006 3:33 PM)               Chief Complaint:  sunburn on feet x last ; rash on arms/legs.  History of Present Illness: went to the beach for one week. Has a rash at the dorsal aspect of his feet.  Now is better.  He also has some rash on the legs. otherwise feels well  Current Allergies: No known allergies       Physical Exam  General:     alert and well-developed.   Skin:     dorsal aspect of feet with areas  of redness, no tenderness or warmness.  Also on the medial aspect of his legs, there is punctiform erythematosus lesions.    Impression & Recommendations:  Problem # 1:  SKIN RASH (ICD-782.1) likely related to sun The patient was concerned given his history of  diabetes but I don't think that he has a  infection.  the rash on the legs is not as typical for sunburn, however it  is resolving. At this time will observe  Problem # 2:  regular follow up in September  Medications Added to Medication List This Visit: 1)  Metoprolol Tartrate 50 Mg Tabs (Metoprolol tartrate) .... 1/2 by mouth bid 2)  Lasix 40 Mg Tabs (Furosemide) .Marland Kitchen.. 1 by mouth qd 3)  Synthroid 100 Mcg Tabs (Levothyroxine sodium) .Marland Kitchen.. 1 by mouth once daily 4)  Amaryl 4 Mg Tabs (Glimepiride) .Marland Kitchen.. 1 by mouth two times a day

## 2010-02-20 NOTE — Assessment & Plan Note (Signed)
Summary: FOLLOW UP/RH.........Marland Kitchen   Vital Signs:  Patient profile:   55 year old male Weight:      208 pounds Pulse rate:   74 / minute Pulse rhythm:   regular BP sitting:   124 / 82  (left arm) Cuff size:   large  Vitals Entered By: Army Fossa CMA (August 21, 2009 11:07 AM) CC: Pt here for f/u appt: Fasting   History of Present Illness: he was last seen on April 2011 Since then several things happened:  He saw cardiology, they decrease simvastatin dose His echocardiogram showed EF of 20-25% Cardiology notes reviewed, the switch him  to carvedilol and are  titrating dose up. They also decrease his Lasix dose  Current Medications (verified): 1)  Amaryl 2 Mg Tabs (Glimepiride) .... Daily 2)  Glucophage 850 Mg Tabs (Metformin Hcl) .Marland Kitchen.. 1 By Mouth Tid 3)  Digitek 0.125 Mg Tabs (Digoxin) .Marland Kitchen.. 1 By Mouth Qd 4)  Lotensin 20 Mg Tabs (Benazepril Hcl) .... 1/2 By Mouth Once Daily 5)  Furosemide 20 Mg Tabs (Furosemide) .... Take One Tab By Mouth Daily 6)  Synthroid 88 Mcg Tabs (Levothyroxine Sodium) .... One By Mouth Daily 7)  Zocor 20 Mg Tabs (Simvastatin) 8)  Bayer Aspirin 325 Mg Tabs (Aspirin) .... Take 1 Tablet By Mouth Once A Day 9)  Carvedilol 25 Mg Tabs (Carvedilol) .... Take One Tablet By Mouth Twice A Day  Allergies (verified): No Known Drug Allergies  Past History:  Past Medical History: Reviewed history from 07/20/2009 and no changes required. EF  40% range /   EF 20-25%...echo..06/14/2009.Marland Kitchen CAD- CABG 2003 Hyperlipidemia...low HDL Diabetes mellitus, type II Hypothyroidism ABUSE, COCAINE, ALCOHOL, ETC ...resolved for many years ATRIAL FIBRILLATION, HX OF  HODGKIN'S DISEASE--Dx aprox 2004, s/p XRT-Chemo IVCD Creatinine... up to 1.5.[facility]land [facility] 2011  Past Surgical History: Reviewed history from 09/08/2008 and no changes required. Tonsillectomy Coronary artery bypass graft (2003) cyst removed from upper back   Social History: Reviewed history from 09/08/2008  and no changes required. 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   Lungs:  normal respiratory effort, no intercostal retractions, no accessory muscle use, and normal breath sounds.   Heart:  normal rate, regular rhythm, and no murmur.   Extremities:  no lower extremity edema Psych:  not anxious appearing and not depressed appearing.     Impression & Recommendations:  Problem # 1:  RENAL INSUFFICIENCY (ICD-588.9)  nephrology consultation pending Lasix dose has been decreased and beta blockers dose increase. This by itself may improve renal function. Labs  Orders: TLB-BMP (Basic Metabolic Panel-BMET) (80048-METABOL)  Problem # 2:  DIABETES MELLITUS, TYPE II (ICD-250.00)  labs His updated medication list for this problem includes:    Amaryl 2 Mg Tabs (Glimepiride) .Marland Kitchen... Daily    Glucophage 850 Mg Tabs (Metformin hcl) .Marland Kitchen... 1 by mouth tid    Lotensin 20 Mg Tabs (Benazepril hcl) .Marland Kitchen... 1/2 by mouth once daily    Bayer Aspirin 325 Mg  Tabs (Aspirin) .Marland Kitchen... Take 1 tablet by mouth once a day  Labs Reviewed: Creat: 1.5 (05/17/2009)    Reviewed HgBA1c results: 6.7 (05/17/2009)  6.3 (02/10/2009)  Orders: Venipuncture (09811) TLB-A1C / Hgb A1C (Glycohemoglobin) (83036-A1C)  Problem # 3:  HYPERLIPIDEMIA (ICD-272.4)  Zocor does decrease by cardiology His updated medication list for this problem includes:    Zocor 20 Mg Tabs (Simvastatin)  Labs Reviewed: SGOT: 21 (05/17/2009)   SGPT: 21 (05/17/2009)    HDL:30.30 (09/08/2008), 27.6 (02/03/2008)  LDL:56 (09/08/2008), DEL (91/47/8295)  Chol:121 (09/08/2008), 153 (02/03/2008)  Trig:174.0 (09/08/2008), 272 (02/03/2008)  Orders: TLB-Lipid Panel (80061-LIPID)  Problem # 4:  CONGESTIVE HEART FAILURE (ICD-428.0) per cardiology His updated medication list for this problem includes:    Digitek 0.125 Mg Tabs (Digoxin) .Marland Kitchen... 1 by mouth qd    Lotensin 20 Mg Tabs (Benazepril hcl) .Marland Kitchen... 1/2 by mouth once daily    Furosemide 20 Mg Tabs (Furosemide) .Marland Kitchen... Take one tab by mouth daily    Bayer Aspirin 325 Mg Tabs (Aspirin) .Marland Kitchen... Take 1 tablet by mouth once a day    Carvedilol 25 Mg Tabs (Carvedilol) .Marland Kitchen... Take one tablet by mouth twice a day  Complete Medication List: 1)  Amaryl 2 Mg Tabs (Glimepiride) .... Daily 2)  Glucophage 850 Mg Tabs (Metformin hcl) .Marland Kitchen.. 1 by mouth tid 3)  Digitek 0.125 Mg Tabs (Digoxin) .Marland Kitchen.. 1 by mouth qd 4)  Lotensin 20 Mg Tabs (Benazepril hcl) .... 1/2 by mouth once daily 5)  Furosemide 20 Mg Tabs (Furosemide) .... Take one tab by mouth daily 6)  Synthroid 88 Mcg Tabs (Levothyroxine sodium) .... One by mouth daily 7)  Zocor 20 Mg Tabs (Simvastatin) 8)  Bayer Aspirin 325 Mg Tabs (Aspirin) .... Take 1 tablet by mouth once a day 9)  Carvedilol 25 Mg Tabs (Carvedilol) .... Take one tablet by mouth twice a day  Patient Instructions: 1)  Please schedule a follow-up appointment in 4 months .

## 2010-02-20 NOTE — Progress Notes (Signed)
Summary: refill meds  Phone Note Refill Request Call back at Home Phone [phone] Message from:  Patient on September 04, 2009 8:46 AM  Refills Requested: Medication #1:  CARVEDILOL 25 MG TABS Take one tablet by mouth twice a day. [facility] in [place] [phone].   Method Requested: Fax to Mail Away Pharmacy Initial call taken by: Lorne Skeens,  September 04, 2009 8:47 AM    New/Updated Medications: CARVEDILOL 25 MG TABS (CARVEDILOL) Take one tablet by mouth twice a day Prescriptions: CARVEDILOL 25 MG TABS (CARVEDILOL) Take one tablet by mouth twice a day  #60 x 6   Entered by:   Danielle Rankin, CMA   Authorized by:   Talitha Givens, MD, [facility]   Signed by:   Danielle Rankin, CMA on 09/04/2009   Method used:   Telephoned to ...       [facility]       [address]       [place], [place]  [address]       Ph: [phone]       Fax: [phone]   RxID:   [id number]

## 2010-02-20 NOTE — Assessment & Plan Note (Signed)
Summary: cpx & lab/cbs   Vital Signs:  Patient profile:   55 year old male Height:      73 inches Weight:      192.2 pounds BMI:     25.45 Pulse rate:   60 / minute Pulse rhythm:   regular BP sitting:   98 / 60  (left arm) Cuff size:   large  Vitals Entered By: Shary Decamp (September 08, 2008 8:14 AM) CC: cpx - fasting Comments  - pt got confused on amaryl -- he has only been taking 1/2 two times a day FBS -- 77, 72, 72, 63 BS after eating -- 188.6, 137, 132, 185 Stacia Hawks  September 08, 2008 8:20 AM    History of Present Illness: CPX DM-- see above  s/p mechanical fall 2 weeks ago, no syncope, injured  his ant chest, still hurts but improving   Preventive Screening-Counseling & Management  Alcohol-Tobacco     Smoking Status: never  Caffeine-Diet-Exercise     Does Patient Exercise: yes      Drug Use:  no.    Allergies: No Known Drug Allergies  Past History:  Past Medical History: Congestive heart failure (EF 35-45% per ECHO 2005) CAD--CABG 2003 Hyperlipidemia Diabetes mellitus, type II Hypothyroidism Hx of ABUSE, COCAINE, ALCOHOL, ETC  ATRIAL FIBRILLATION, HX OF  HX, PERSONAL, HODGKIN'S DISEASE--Dx aprox 2004, s/p XRT-Chemo  Past Surgical History: Tonsillectomy Coronary artery bypass graft (2003) cyst removed from upper back   Family History: Reviewed history from 05/12/2007 and no changes required. 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 CV:  Denies chest pain or discomfort and swelling of feet. Resp:  Denies cough and shortness of breath. GI:  Denies bloody stools, diarrhea, nausea, and vomiting. GU:  Denies dysuria, hematuria, urinary frequency, and urinary  hesitancy. Psych:  Denies anxiety and depression.  Physical Exam  General:  alert and well-developed.   Neck:  no masses, no thyromegaly, and normal carotid upstroke.   Chest Wall:  tender at the R ant-lower chest  Lungs:  normal respiratory effort, no intercostal retractions, no accessory muscle use, and normal breath sounds.   Heart:  normal rate, regular rhythm, and no murmur.   Abdomen:  soft, non-tender, no distention, no masses, no guarding, and no rigidity.   Rectal:  No external abnormalities noted. Normal sphincter tone. No rectal masses or tenderness. HEM neg  Prostate:  Prostate gland firm and smooth, no enlargement, nodularity, tenderness, mass, asymmetry or induration. Extremities:  no pretibial edema bilaterally  Psych:  Cognition and judgment appear intact. Alert and cooperative with normal attention span and concentration.    Impression & Recommendations:  Problem # 1:  HEALTH SCREENING (ICD-V70.0) never had a   Colonoscopy, hemocult neg last year Colonoscopy Vs. iFOB cards reviewed w/ pt. Provided  iFOB , otherwise, he will call and ask for a colonoscopy  Td 2003 Orders: TLB-Hemoglobin (Hgb) (85018-HGB) TLB-PSA (Prostate Specific Antigen) (84153-PSA)  Problem # 2:  DIABETES MELLITUS, TYPE II (ICD-250.00) taking less amaryl (from 1 in AM- 0.5 PM   to 0.5 in AM  - 0.5 PM) x 2 months , labs , adjust med as needed  His updated medication list for this problem includes:  Amaryl 4 Mg Tabs (Glimepiride) .Marland Kitchen... 1/2  by mouth qam; 1/2 by mouth qpm    Glucophage 850 Mg Tabs (Metformin hcl) .Marland Kitchen... 1 by mouth tid    Lotensin 20 Mg Tabs (Benazepril hcl) .Marland Kitchen... 1/2 by mouth once daily    Bayer Aspirin 325 Mg Tabs (Aspirin) .Marland Kitchen... Take 1 tablet by mouth once a day  Orders: TLB-A1C / Hgb A1C (Glycohemoglobin) (83036-A1C) TLB-Microalbumin/Creat Ratio, Urine (82043-MALB) TLB-BMP (Basic Metabolic Panel-BMET) (80048-METABOL)  Problem # 3:  HYPERLIPIDEMIA (ICD-272.4)  His updated  medication list for this problem includes:    Simvastatin 80 Mg Tabs (Simvastatin) .Marland Kitchen... 1 at night  Orders: TLB-ALT (SGPT) (84460-ALT) TLB-AST (SGOT) (84450-SGOT) TLB-Lipid Panel (80061-LIPID)  Labs Reviewed: SGOT: 18 (02/03/2008)   SGPT: 16 (02/03/2008)   HDL:27.6 (02/03/2008), 30.0 (05/12/2007)  LDL:DEL (02/03/2008), DEL (05/12/2007)  Chol:153 (02/03/2008), 176 (05/12/2007)  Trig:272 (02/03/2008), 339 (05/12/2007)  Problem # 4:  CORONARY ARTERY DISEASE (ICD-414.00) asx His updated medication list for this problem includes:    Lotensin 20 Mg Tabs (Benazepril hcl) .Marland Kitchen... 1/2 by mouth once daily    Metoprolol Tartrate 50 Mg Tabs (Metoprolol tartrate) .Marland Kitchen... 1/2 by mouth bid    Lasix 20 Mg Tabs (Furosemide) .Marland Kitchen... 1 by mouth once daily    Bayer Aspirin 325 Mg Tabs (Aspirin) .Marland Kitchen... Take 1 tablet by mouth once a day  Problem # 5:  HYPOTHYROIDISM (ICD-244.9)  His updated medication list for this problem includes:    Synthroid 75 Mcg Tabs (Levothyroxine sodium) .Marland Kitchen... 1 by mouth once daily  Orders: TLB-ALT (SGPT) (84460-ALT) TLB-AST (SGOT) (84450-SGOT) TLB-TSH (Thyroid Stimulating Hormone) (84443-TSH)  Labs Reviewed: TSH: 0.24 (06/02/2008)    HgBA1c: 5.8 (06/02/2008) Chol: 153 (02/03/2008)   HDL: 27.6 (02/03/2008)   LDL: DEL (02/03/2008)   TG: 272 (02/03/2008)  Complete Medication List: 1)  Amaryl 4 Mg Tabs (Glimepiride) .... 1/2  by mouth qam; 1/2 by mouth qpm 2)  Glucophage 850 Mg Tabs (Metformin hcl) .Marland Kitchen.. 1 by mouth tid 3)  Digitek 0.125 Mg Tabs (Digoxin) .Marland Kitchen.. 1 by mouth qd 4)  Lotensin 20 Mg Tabs (Benazepril hcl) .... 1/2 by mouth once daily 5)  Metoprolol Tartrate 50 Mg Tabs (Metoprolol tartrate) .... 1/2 by mouth bid 6)  Lasix 20 Mg Tabs (Furosemide) .Marland Kitchen.. 1 by mouth once daily 7)  Synthroid 75 Mcg Tabs (Levothyroxine sodium) .Marland Kitchen.. 1 by mouth once daily 8)  Simvastatin 80 Mg Tabs (Simvastatin) .Marland Kitchen.. 1 at night 9)  Bayer Aspirin 325 Mg Tabs (Aspirin) .... Take 1 tablet by mouth  once a day 10)  Viagra 100 Mg Tabs (Sildenafil citrate) .... 1/2 to 1 by mouth once daily as needed  Other Orders: Venipuncture (98119)  Patient Instructions: 1)  Please schedule a follow-up appointment in 4 months .

## 2010-02-20 NOTE — Assessment & Plan Note (Signed)
Summary: f2y/ gd  Medications Added FUROSEMIDE 40 MG TABS (FUROSEMIDE) Take one tablet by mouth daily. SIMVASTATIN 80 MG TABS (SIMVASTATIN) 1/2 tab at night      Allergies Added: NKDA  Visit Type:  Follow-up Primary Provider:  Nolon Rod. Paz MD  CC:  CAD.  History of Present Illness: The patient is seen for followup of coronary artery disease.  I saw him last in March, 2009.  He underwent CABG in 2003.  He has not had symptoms and there has been no exercise test since then.  He had an echo in 2005.  Ejection fraction was in the 40% range.  He does not have chest pain or shortness of breath.  He does not smoke.  He walks daily.  He has not had syncope or presyncope.  Current Medications (verified): 1)  Amaryl 4 Mg Tabs (Glimepiride) .... 1/2  By Mouth Qam; 2)  Glucophage 850 Mg Tabs (Metformin Hcl) .Marland Kitchen.. 1 By Mouth Tid 3)  Digitek 0.125 Mg Tabs (Digoxin) .Marland Kitchen.. 1 By Mouth Qd 4)  Lotensin 20 Mg Tabs (Benazepril Hcl) .... 1/2 By Mouth Once Daily 5)  Metoprolol Tartrate 50 Mg Tabs (Metoprolol Tartrate) .... 1/2 By Mouth Bid 6)  Furosemide 40 Mg Tabs (Furosemide) .... Take One Tablet By Mouth Daily. 7)  Synthroid 88 Mcg Tabs (Levothyroxine Sodium) .... One By Mouth Daily 8)  Simvastatin 80 Mg Tabs (Simvastatin) .Marland Kitchen.. 1 At Night 9)  Bayer Aspirin 325 Mg Tabs (Aspirin) .... Take 1 Tablet By Mouth Once A Day  Allergies (verified): No Known Drug Allergies  Past History:  Past Medical History: EF  40% range CAD- CABG 2003 Hyperlipidemia...low HDL Diabetes mellitus, type II Hypothyroidism ABUSE, COCAINE, ALCOHOL, ETC ...resolved for many years ATRIAL FIBRILLATION, HX OF  HODGKIN'S DISEASE--Dx aprox 2004, s/p XRT-Chemo IVCD Creatinine... up to 1.5.Marland KitchenMarland KitchenMarland Kitchen 2011  Review of Systems       Patient denies fever, chills, headache, sweats, rash, change in vision, change in hearing, chest pain, cough, nausea vomiting, urinary symptoms.  All of the systems are reviewed and are negative.  Vital  Signs:  Patient profile:   55 year old male Height:      73 inches Weight:      212 pounds BMI:     28.07 Pulse rate:   59 / minute BP sitting:   96 / 58  (left arm) Cuff size:   regular  Vitals Entered By: Hardin Negus, RMA (May 25, 2009 8:40 AM)  Physical Exam  General:  patient is quite stable in general. Eyes:  no xanthelasma. Neck:  no jugular venous distention.  No carotid bruits. Lungs:  lungs are clear.  Respiratory effort is nonlabored. Heart:  cardiac exam reveals S1 and S2.  No clicks or significant murmurs. Abdomen:  abdomen is soft. Extremities:  no peripheral edema. Psych:  patient is oriented to person time and place.  Affect is normal.   Impression & Recommendations:  Problem # 1:  RENAL INSUFFICIENCY (ICD-588.9) I noted from review of his labs that over time his creatinine has gone from 1.0-1.5.  This may be related to his diabetic disease.  Problem # 2:  INCREASED BLOOD PRESSURE (ICD-796.2) Blood pressure is on the lower side today.  He certainly does not need any further treatment.  I  Problem # 3:  HYPERLIPIDEMIA (ICD-272.4)  His updated medication list for this problem includes:    Simvastatin 80 Mg Tabs (Simvastatin) .Marland Kitchen... 1/2 tab at night When his labs were checked last  in August, 2010, his LDL was 56.  HDL was low at 30.  His HDL is below her time.  He does not smoke and he does exercise.  Consideration could be given to using niacin. .  At this time he is on multiple other medications and I'm hesitant to push too many meds.  With the FDA recommendations concerning dosing of simvastatin, his dose will be reduced to 40 mg.  Problem # 4:  CORONARY ARTERY DISEASE (ICD-414.00)  His updated medication list for this problem includes:    Lotensin 20 Mg Tabs (Benazepril hcl) .Marland Kitchen... 1/2 by mouth once daily    Metoprolol Tartrate 50 Mg Tabs (Metoprolol tartrate) .Marland Kitchen... 1/2 by mouth bid    Bayer Aspirin 325 Mg Tabs (Aspirin) .Marland Kitchen... Take 1 tablet by mouth once a  day Coronary disease is stable.  EKG is done and reviewed by me.  He does have some T-wave inversion in the inferior leads.  I doubt this represents a significant change.  No further ischemic workup to be done at this time.  Orders: Echocardiogram (Echo)  Problem # 5:  * EF 40% RANGE  This sort the patient's ejection fraction was 40% range.  It is important to redocument his ejection fraction to determine if any changes need to be made in his medications or other therapy.  Two-dimensional echo will be scheduled.  Orders: Echocardiogram (Echo)  Other Orders: EKG w/ Interpretation (93000)  Patient Instructions: 1)  Decrease Simvastatin to 40mg  daily 2)  Your physician has requested that you have an echocardiogram.  Echocardiography is a painless test that uses sound waves to create images of your heart. It provides your doctor with information about the size and shape of your heart and how well your heart's chambers and valves are working.  This procedure takes approximately one hour. There are no restrictions for this procedure. 3)  Follow up in 1 year

## 2010-02-20 NOTE — Letter (Signed)
Summary: Appointment - Reminder 2  [facility], Main Office  [address]   [place], [place] [address]   Phone: [phone]  Fax: [phone]     February 16, 2009 MRN: [id number]   LUC SHAMMAS [address] [place], [place]  [address]   Dear Mr. Greenhalgh,  Our records indicate that it is time to schedule a follow-up appointment with Dr. [name]. It is very important that we reach you to schedule this appointment. We look forward to participating in your health care needs. Please contact us at the number listed above at your earliest convenience to schedule your appointment.  If you are unable to make an appointment at this time, give [place] a call so we can update our records.  Sincerely,   [facility] D[facility] Scheduling Team

## 2010-02-20 NOTE — Progress Notes (Signed)
Summary: refill continued  Phone Note Refill Request Message from:  Patient  Refills Requested: Medication #1:  SYNTHROID 88 MCG TABS one by mouth daily  Medication #2:  ZOCOR 20 MG TABS 1 by mouth at bedtime. patient out of town forgot meds- [facility]  [id number]    Initial call taken by: Okey Regal [place],  September 04, 2009 8:51 AM    Prescriptions: ZOCOR 20 MG TABS (SIMVASTATIN) 1 by mouth at bedtime.  #30 x 0   Entered by:   Army Fossa CMA   Authorized by:   Nolon Rod. Paz MD   Signed by:   Army Fossa CMA on 09/04/2009   Method used:   Print then Give to Patient   RxID:   [id number] SYNTHROID 88 MCG TABS (LEVOTHYROXINE SODIUM) one by mouth daily  #30 x 0   Entered by:   Army Fossa CMA   Authorized by:   Nolon Rod. Paz MD   Signed by:   Army Fossa CMA on 09/04/2009   Method used:   Print then Give to Patient   RxID:   [id number]

## 2010-02-20 NOTE — Miscellaneous (Signed)
  Clinical Lists Changes  Observations: Added new observation of PAST MED HX: EF  40% range /   EF 20-25%...echo..06/14/2009.Marland Kitchen CAD- CABG 2003 Hyperlipidemia...low HDL Diabetes mellitus, type II Hypothyroidism ABUSE, COCAINE, ALCOHOL, ETC ...resolved for many years ATRIAL FIBRILLATION, HX OF  HODGKIN'S DISEASE--Dx aprox 2004, s/p XRT-Chemo IVCD Creatinine... up to 1.5.Marland KitchenMarland KitchenMarland Kitchen 2011   (06/22/2009 10:59) Added new observation of PRIMARY MD: Nolon Rod. Paz MD (06/22/2009 10:59)       Past History:  Past Medical History: EF  40% range /   EF 20-25%...echo..06/14/2009.Marland Kitchen CAD- CABG 2003 Hyperlipidemia...low HDL Diabetes mellitus, type II Hypothyroidism ABUSE, COCAINE, ALCOHOL, ETC ...resolved for many years ATRIAL FIBRILLATION, HX OF  HODGKIN'S DISEASE--Dx aprox 2004, s/p XRT-Chemo IVCD Creatinine... up to 1.5.Marland KitchenMarland KitchenMarland Kitchen 2011

## 2010-02-20 NOTE — Assessment & Plan Note (Signed)
Summary: 4 month followup/kn   Vital Signs:  Patient profile:   55 year old male Weight:      210.38 pounds Pulse rate:   73 / minute Pulse rhythm:   regular BP sitting:   102 / 78  (left arm) Cuff size:   regular  Vitals Entered By: Army Fossa CMA (December 21, 2009 9:27 AM) CC: 4 month f/u- not fasting  Comments dicuss amarly only been taking 1/2 tablet daily declines flu shot [facility]   History of Present Illness: 4 month f/u- not fasting     CAD-CHF: Note from cardiology 10-11 reviewed, he is stable, unable to use spironolactone d/t history of hyperkalemia   Diabetes-- good compliance with medications as prescribed.  ambulatory CBGs 115 (AM)  CRI--status post  eval  by Dr. Allena Katz, plans to do labs soon and f/u w/ him   declines flu shot-- explained benefits!  Current Medications (verified): 1)  Amaryl 2 Mg Tabs (Glimepiride) .... Daily 2)  Glucophage 850 Mg Tabs (Metformin Hcl) .Marland Kitchen.. 1 By Mouth Tid 3)  Digitek 0.125 Mg Tabs (Digoxin) .Marland Kitchen.. 1 By Mouth Qd 4)  Lotensin 20 Mg Tabs (Benazepril Hcl) .... 1/2 By Mouth Once Daily 5)  Furosemide 40 Mg Tabs (Furosemide) .Marland Kitchen.. 1 By Mouth Daily. 6)  Synthroid 88 Mcg Tabs (Levothyroxine Sodium) .... One By Mouth Daily 7)  Zocor 40 Mg Tabs (Simvastatin) .Marland Kitchen.. 1 By Mouth At Bedtime. 8)  Bayer Aspirin 325 Mg Tabs (Aspirin) .... Take 1 Tablet By Mouth Once A Day 9)  Carvedilol 25 Mg Tabs (Carvedilol) .... Take One Tablet By Mouth Twice A Day  Allergies (verified): No Known Drug Allergies  Past History:  Past Medical History: EF  40% range /   EF 20-25%...echo..06/14/2009.Marland Kitchen CAD- CABG 2003 Hyperlipidemia...low HDL Diabetes mellitus, type II Hypothyroidism ABUSE, COCAINE, ALCOHOL, ETC ...resolved for many years ATRIAL FIBRILLATION, HX OF  HODGKIN'S DISEASE--Dx aprox 2004, s/p XRT-Chemo IVCD Creatinine... up to 1.5.Marland KitchenMarland KitchenMarland Kitchen 2011  /  CKD 3.. multifactorial... Dr. Allena Katz.. consult September 18, 2009 Hyperkalemia   .... August,  2011... repeat potassium pending  Past Surgical History: Reviewed history from 09/08/2008 and no changes required. Tonsillectomy Coronary artery bypass graft (2003) cyst removed from upper back   Social History: Reviewed history from 09/08/2008 and no changes required. 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 CV:  Denies chest pain or discomfort and swelling of feet. Resp:  Denies cough and shortness of breath.  Physical Exam  General:  alert and well-developed.   Lungs:  normal respiratory effort, no intercostal retractions, no accessory muscle use, and normal breath sounds.   Heart:  normal rate, regular rhythm, and no murmur.   Extremities:  no lower extremity edema Psych:  not anxious appearing and not depressed appearing.     Impression & Recommendations:  Problem # 1:  RENAL INSUFFICIENCY (ICD-588.9) to have labs at [facility]  this week patient aware that at some point we may need to discontinue metformin due to renal insufficiency  Problem # 2:  DIABETES MELLITUS, TYPE II (ICD-250.00) he will have blood work this week as ordered by nephrology, I will add a hemoglobin A1c to those orders Continue present care, he is taking Amaryl in the morning Consider discontinue metformin depending on his renal function His updated medication list for this  problem includes:    Amaryl 2 Mg Tabs (Glimepiride) .Marland Kitchen... 1/2 a day    Glucophage 850 Mg Tabs (Metformin hcl) .Marland Kitchen... 1 by mouth tid    Lotensin 20 Mg Tabs (Benazepril hcl) .Marland Kitchen... 1/2 by mouth once daily    Bayer Aspirin 325 Mg Tabs (Aspirin) .Marland Kitchen... Take 1 tablet by mouth once a day  Labs Reviewed: Creat: 1.5 (09/08/2009)    Reviewed HgBA1c results: 6.7 (08/21/2009)  6.7 (05/17/2009)  Problem # 3:  CONGESTIVE HEART FAILURE (ICD-428.0) well controlled His  updated medication list for this problem includes:    Digitek 0.125 Mg Tabs (Digoxin) .Marland Kitchen... 1 by mouth qd    Lotensin 20 Mg Tabs (Benazepril hcl) .Marland Kitchen... 1/2 by mouth once daily    Furosemide 40 Mg Tabs (Furosemide) .Marland Kitchen... 1 by mouth daily.    Bayer Aspirin 325 Mg Tabs (Aspirin) .Marland Kitchen... Take 1 tablet by mouth once a day    Carvedilol 25 Mg Tabs (Carvedilol) .Marland Kitchen... Take one tablet by mouth twice a day  Problem # 4:  HYPOTHYROIDISM (ICD-244.9) due for a TSH, if not checked this week, will order one  His updated medication list for this problem includes:    Synthroid 88 Mcg Tabs (Levothyroxine sodium) ..... One by mouth daily  Labs Reviewed: TSH: 4.20 (02/10/2009)    HgBA1c: 6.7 (08/21/2009) Chol: 175 (08/21/2009)   HDL: 32.30 (08/21/2009)   LDL: 56 (09/08/2008)   TG: 351.0 (08/21/2009)  Complete Medication List: 1)  Amaryl 2 Mg Tabs (Glimepiride) .... 1/2 a day 2)  Glucophage 850 Mg Tabs (Metformin hcl) .Marland Kitchen.. 1 by mouth tid 3)  Digitek 0.125 Mg Tabs (Digoxin) .Marland Kitchen.. 1 by mouth qd 4)  Lotensin 20 Mg Tabs (Benazepril hcl) .... 1/2 by mouth once daily 5)  Furosemide 40 Mg Tabs (Furosemide) .Marland Kitchen.. 1 by mouth daily. 6)  Synthroid 88 Mcg Tabs (Levothyroxine sodium) .... One by mouth daily 7)  Zocor 40 Mg Tabs (Simvastatin) .Marland Kitchen.. 1 by mouth at bedtime. 8)  Bayer Aspirin 325 Mg Tabs (Aspirin) .... Take 1 tablet by mouth once a day 9)  Carvedilol 25 Mg Tabs (Carvedilol) .... Take one tablet by mouth twice a day 10)  Labs  .... Please checka a1c (dx dm) ast alt  (dx high cholesterol) send all results to [place]  Patient Instructions: 1)  Please schedule a follow-up appointment in  4 to 5  months .  Prescriptions: LABS PLEASE CHECKA A1C (DX DM) AST ALT  (DX HIGH CHOLESTEROL) SEND ALL RESULTS TO [place]  #0 x 0   Entered and Authorized by:   Nolon Rod. Audyn Dimercurio MD   Signed by:   Nolon Rod. Jakobi Thetford MD on 12/21/2009   Method used:   Print then Give to Patient   RxID:   [id number]    Orders Added: 1)  Est. Patient Level  III [44010]

## 2010-02-20 NOTE — Progress Notes (Signed)
Summary: pt does not want to start lantus  Phone Note Call from Patient Call back at Home Phone [phone]   Summary of Call: Patient left a message on my voice mail stating that he was not ready to start Lantus.  I called patient back left message on his voice mail to continue all meds -- check BS & call me in 2 weeks.  Instructed patient to let me know when he changes his mind about the lantus.  Advised patient to f/u with Dr. Drue Novel in 6 weeks Shary Decamp  February 12, 2008 1:28 PM   Follow-up for Phone Call        noted, besides taking his med as before, call in Januvia 100 1 by mouth once daily (i recognize cost may be a issue) Tiffanni Scarfo E. Kaushik Maul MD  February 12, 2008 3:37 PM   Left message on machine to return call.......Marland KitchenShary Decamp  February 15, 2008 5:07 PM  discussed with patient @ bp check Shary Decamp  February 17, 2008 10:27 AM      New/Updated Medications: AMARYL 4 MG TABS (GLIMEPIRIDE) 2 by mouth once daily

## 2010-02-20 NOTE — Miscellaneous (Signed)
Summary: labs   Clinical Lists Changes  Observations: Added new observation of SGPT (ALT): 19 units/L (12/21/2009 16:05) Added new observation of SGOT (AST): 21 units/L (12/21/2009 16:05) Added new observation of HGBA1C: 6.3 % (12/21/2009 16:05)

## 2010-02-20 NOTE — Assessment & Plan Note (Signed)
Summary: discuss echo results  Medications Added CARVEDILOL 12.5 MG TABS (CARVEDILOL) take 6.25mg  of carvedilol twice a day for 1 week then increase to caverdilol 12.5mg  twice daily for 3 weeks      Allergies Added: NKDA  Visit Type:  Follow-up Primary Provider:  Nolon Rod. Paz MD  CC:  CAD.  History of Present Illness: The patient is seen to followup CAD and left ventricular dysfunction.  I saw him last he 2011.  At that time he was doing well clinically.  His ejection fraction had not been checked for several years.  Two-dimensional echo was done.  Unfortunately his ejection fraction now is in the 20-25% range with diffuse hypokinesis.  There is akinesis of the inferior wall.  He is back today for further discussion.  I've explained the situation to him.  Clinically he has not changed.  Current Medications (verified): 1)  Amaryl 4 Mg Tabs (Glimepiride) .... 1/2  By Mouth Qam; 2)  Glucophage 850 Mg Tabs (Metformin Hcl) .Marland Kitchen.. 1 By Mouth Tid 3)  Digitek 0.125 Mg Tabs (Digoxin) .Marland Kitchen.. 1 By Mouth Qd 4)  Lotensin 20 Mg Tabs (Benazepril Hcl) .... 1/2 By Mouth Once Daily 5)  Metoprolol Tartrate 50 Mg Tabs (Metoprolol Tartrate) .... 1/2 By Mouth Bid 6)  Furosemide 40 Mg Tabs (Furosemide) .... Take One Tablet By Mouth Daily. 7)  Synthroid 88 Mcg Tabs (Levothyroxine Sodium) .... One By Mouth Daily 8)  Simvastatin 80 Mg Tabs (Simvastatin) .... 1/2 Tab At Night 9)  Bayer Aspirin 325 Mg Tabs (Aspirin) .... Take 1 Tablet By Mouth Once A Day  Allergies (verified): No Known Drug Allergies  Past History:  Past Medical History: Last updated: 06/22/2009 EF  40% range /   EF 20-25%...echo..06/14/2009.Marland Kitchen CAD- CABG 2003 Hyperlipidemia...low HDL Diabetes mellitus, type II Hypothyroidism ABUSE, COCAINE, ALCOHOL, ETC ...resolved for many years ATRIAL FIBRILLATION, HX OF  HODGKIN'S DISEASE--Dx aprox 2004, s/p XRT-Chemo IVCD Creatinine... up to 1.5.Marland KitchenMarland KitchenMarland Kitchen 2011  Review of Systems       Patient denies  fever, chills, headache, sweats, rash, change in vision, change in hearing, chest pain, cough, nausea vomiting, urinary symptoms.  All other systems are reviewed and are negative.  Vital Signs:  Patient profile:   55 year old male Height:      73 inches Weight:      211 pounds Pulse rate:   72 / minute BP sitting:   112 / 58  (left arm) Cuff size:   regular  Vitals Entered By: Burnett Kanaris, CNA (June 23, 2009 9:02 AM)  Physical Exam  General:  patient is stable. Eyes:  no xanthelasma. Neck:  no jugular distention. Lungs:  lungs are clear.  Respiratory effort is not labored. Heart:  cardiac exam reveals S1-S2.  No clicks or significant murmurs. Abdomen:  abdomen is soft. Extremities:  no peripheral edema. Psych:  patient is oriented to person time and place.  Affect is normal.  He is concerned about the news of his decreased left ventricular function.   Impression & Recommendations:  Problem # 1:  * EF 40% RANGE...Marland Kitchennow 25% The patient now has an ejection fraction of 20-25%.  It is possible that he has had a silent coronary event.  He could have diabetic cardiomyopathy also.  I have considered repeat catheterization but I feel this is not indicated yet.  ICD will have to be considered in the future but I would not consider this yet as we need to first push his medicines 2 inoperable level  and repeat an echo.  I will change his low-dose metoprolol to carvedilol and titrate the dose up.  After that I will try to add spironolactone.  His TSH was checked in January and it was normal.  Problem # 2:  INCREASED BLOOD PRESSURE (ICD-796.2) blood pressure is stable at this time.  Problem # 3:  CORONARY ARTERY DISEASE (ICD-414.00)  The following medications were removed from the medication list:    Metoprolol Tartrate 50 Mg Tabs (Metoprolol tartrate) .Marland Kitchen... 1/2 by mouth bid His updated medication list for this problem includes:    Lotensin 20 Mg Tabs (Benazepril hcl) .Marland Kitchen... 1/2 by mouth once  daily    Bayer Aspirin 325 Mg Tabs (Aspirin) .Marland Kitchen... Take 1 tablet by mouth once a day    Carvedilol 12.5 Mg Tabs (Carvedilol) .Marland Kitchen... Take 6.25mg  of carvedilol twice a day for 1 week then increase to caverdilol 12.5mg  twice daily for 3 weeks Coronary disease is stable.  No further workup at this visit.  Patient Instructions: 1)  Your physician recommends that you schedule a follow-up appointment in: follow up in 4 weeks 2)  Your physician has recommended you make the following change in your medication: please discontinue your metoprolol and start carvedilol 6.25mg  twice daily for 1 week--then carvedilol 12.5mg  twice daily for 3 weeks Prescriptions: CARVEDILOL 12.5 MG TABS (CARVEDILOL) take 6.25mg  of carvedilol twice a day for 1 week then increase to caverdilol 12.5mg  twice daily for 3 weeks  #60 x 1   Entered by:   Ledon Snare, RN   Authorized by:   Talitha Givens, MD, [facility]   Signed by:   Ledon Snare, RN on 06/23/2009   Method used:   Electronically to        [facility]       [address]       [place], [place]  [address]       Ph: [phone]       Fax: [phone]   RxID:   [id number]

## 2010-02-20 NOTE — Letter (Signed)
Summary: Primary Care Appointment Letter  [facility] [place]  [address] [place], [place] [address]   Phone: [phone]  Fax: [phone]    12/23/2006 MRN: [id number]  DESMUND ELMAN [address] [place], [place]  [address]  Dear Mr. Bascom Levels,   Your Primary Care Physician  has indicated that:    ____X__it is time to schedule an appointment.    _______you missed your appointment on______ and need to call and          reschedule.    _______you need to have lab work done.    _______you need to schedule an appointment discuss lab or test results.    _______you need to call to reschedule your appointment that is                       scheduled on _________.     Please call our office as soon as possible. Our phone number is [phone]. Our office is open 8a-12noon and 1p-5p, Monday through Friday.    Thank you,    [facility]

## 2010-02-20 NOTE — Miscellaneous (Signed)
  Clinical Lists Changes  Observations: Added new observation of PAST MED HX: EF  40% range CAD- CABG 2003 Hyperlipidemia Diabetes mellitus, type II Hypothyroidism ABUSE, COCAINE, ALCOHOL, ETC ...resolved for many years ATRIAL FIBRILLATION, HX OF  HODGKIN'S DISEASE--Dx aprox 2004, s/p XRT-Chemo IVCD carotid artery disease... Doppler... march, 2011.Marland Kitchen 0-39% RICA, 60-79% LICA   (05/24/2009 8:48)       Past History:  Past Medical History: EF  40% range CAD- CABG 2003 Hyperlipidemia Diabetes mellitus, type II Hypothyroidism ABUSE, COCAINE, ALCOHOL, ETC ...resolved for many years ATRIAL FIBRILLATION, HX OF  HODGKIN'S DISEASE--Dx aprox 2004, s/p XRT-Chemo IVCD carotid artery disease... Doppler... march, 2011.Marland Kitchen 0-39% RICA, 60-79% LICA

## 2010-02-22 NOTE — Miscellaneous (Signed)
  Clinical Lists Changes  Observations: Added new observation of CARDIO HPI: Since the patient's last visit, I have reviewed his records extensively including reviewing his 2-D echo.  His EF is still low but it is improving since the prior echo.  I'm not yet spoken with the patient about a potential ICD.  I would like to see if we can push his meds a little further.  I will talk to him about increasing his Lotensin to 20 mg daily.  His thyroid status has been followed.  We may give him another trial of Aldactone. I will call him with this information (02/13/2010 8:59) Added new observation of PAST MED HX: EF  40% range /   EF 20-25%...echo..06/14/2009.Marland Kitchen  /  EF 30-35%... echo... February 12, 2010 Radiation therapy?Marland Kitchen.Hodgkin's disease...Marland KitchenMarland Kitchen need to review dose and radiation area.... CAD- CABG 2003 Hyperlipidemia...low HDL Diabetes mellitus, type II Hypothyroidism ABUSE, COCAINE, ALCOHOL, ETC ...resolved for many years ATRIAL FIBRILLATION, HX OF  (PAF)...limited HODGKIN'S DISEASE--Dx aprox 2004, s/p XRT-Chemo IVCD Creatinine... up to 1.5.Marland KitchenMarland KitchenMarland Kitchen 2011  /  CKD 3.. multifactorial... Dr. Allena Rachyl Wuebker.. consult September 18, 2009 Hyperkalemia   .... August, 2011..(Aldactone).. Aldactone stopped   (02/13/2010 8:59) Added new observation of REFERRING MD: Zetta Bills, MD (02/13/2010 8:59) Added new observation of PRIMARY MD: Nolon Rod. Paz MD (02/13/2010 8:59)      Referring Provider:  Zetta Bills, MD Primary Provider:  Nolon Rod. Paz MD   History of Present Illness: Since the patient's last visit, I have reviewed his records extensively including reviewing his 2-D echo.  His EF is still low but it is improving since the prior echo.  I'm not yet spoken with the patient about a potential ICD.  I would like to see if we can push his meds a little further.  I will talk to him about increasing his Lotensin to 20 mg daily.  His thyroid status has been followed.  We may give him another trial of Aldactone. I will call him with  this information   Past History:  Past Medical History: EF  40% range /   EF 20-25%...echo..06/14/2009.Marland Kitchen  /  EF 30-35%... echo... February 12, 2010 Radiation therapy?Marland Kitchen.Hodgkin's disease...Marland KitchenMarland Kitchen need to review dose and radiation area.... CAD- CABG 2003 Hyperlipidemia...low HDL Diabetes mellitus, type II Hypothyroidism ABUSE, COCAINE, ALCOHOL, ETC ...resolved for many years ATRIAL FIBRILLATION, HX OF  (PAF)...limited HODGKIN'S DISEASE--Dx aprox 2004, s/p XRT-Chemo IVCD Creatinine... up to 1.5.Marland KitchenMarland KitchenMarland Kitchen 2011  /  CKD 3.. multifactorial... Dr. Allena Alayia Meggison.. consult September 18, 2009 Hyperkalemia   .... August, 2011..(Aldactone).. Aldactone stopped

## 2010-02-22 NOTE — Assessment & Plan Note (Signed)
Summary: per check out/sf   Visit Type:  Follow-up Referring Provider:  Zetta Bills, MD Primary Provider:  Nolon Rod. Paz MD  CC:  Cardiomyopathy.  History of Present Illness: The patient is seen for followup of cardiomyopathy.  I saw him last, October, 2011.  He is on digoxin and ACE inhibitor and carvedilol.  He cannot tolerate Aldactone as his potassium increased.  I may try this again in the future.  It is now time to reassess his LV function with 2-D echo.  Current Medications (verified): 1)  Amaryl 2 Mg Tabs (Glimepiride) .... 1/2 A Day 2)  Glucophage 850 Mg Tabs (Metformin Hcl) .Marland Kitchen.. 1 By Mouth Tid 3)  Digitek 0.125 Mg Tabs (Digoxin) .Marland Kitchen.. 1 By Mouth Qd 4)  Lotensin 20 Mg Tabs (Benazepril Hcl) .... 1/2 By Mouth Once Daily 5)  Furosemide 40 Mg Tabs (Furosemide) .Marland Kitchen.. 1 By Mouth Daily. 6)  Synthroid 88 Mcg Tabs (Levothyroxine Sodium) .... One By Mouth Daily 7)  Zocor 40 Mg Tabs (Simvastatin) .Marland Kitchen.. 1 By Mouth At Bedtime. 8)  Bayer Aspirin 325 Mg Tabs (Aspirin) .... Take 1 Tablet By Mouth Once A Day 9)  Carvedilol 25 Mg Tabs (Carvedilol) .... Take One Tablet By Mouth Twice A Day  Allergies (verified): No Known Drug Allergies  Past History:  Past Medical History: EF  40% range /   EF 20-25%...echo..06/14/2009.Marland Kitchen CAD- CABG 2003 Hyperlipidemia...low HDL Diabetes mellitus, type II Hypothyroidism ABUSE, COCAINE, ALCOHOL, ETC ...resolved for many years ATRIAL FIBRILLATION, HX OF  HODGKIN'S DISEASE--Dx aprox 2004, s/p XRT-Chemo IVCD Creatinine... up to 1.5.Marland KitchenMarland KitchenMarland Kitchen 2011  /  CKD 3.. multifactorial... Dr. Allena Diantha Paxson.. consult September 18, 2009 Hyperkalemia   .... August, 2011... repeat potassium pending..  Review of Systems       Patient denies fever, chills, headache, sweats, rash, change in vision, change in hearing, chest pain, cough, nausea vomiting, urinary symptoms.  All other systems are reviewed and are negative.  Vital Signs:  Patient profile:   55 year old male Height:      73  inches Weight:      211 pounds BMI:     27.94 Pulse rate:   65 / minute BP sitting:   106 / 58  (left arm) Cuff size:   regular  Vitals Entered By: Hardin Negus, RMA (February 06, 2010 8:55 AM)  Physical Exam  General:  patient is stable. Eyes:  no xanthelasma. Neck:  no  jugular venous extension. Lungs:  lungs are clear respiratory effort is nonlabored. Heart:  cardiac exam reveals an S1-S2.  No clicks or significant murmurs. Abdomen:  abdomen is soft. Extremities:  no peripheral edema. Psych:  patient is oriented to person time and place.  Affect is normal.   Impression & Recommendations:  Problem # 1:  * EJECTION FRACTION 25% Two-dimensional echo will be ordered.  I am hopeful that he has had increase in LV function.  Problem # 2:  CORONARY ARTERY DISEASE (ICD-414.00)  His updated medication list for this problem includes:    Lotensin 20 Mg Tabs (Benazepril hcl) .Marland Kitchen... 1/2 by mouth once daily    Bayer Aspirin 325 Mg Tabs (Aspirin) .Marland Kitchen... Take 1 tablet by mouth once a day    Carvedilol 25 Mg Tabs (Carvedilol) .Marland Kitchen... Take one tablet by mouth twice a day Coronary disease is stable.  No further workup.  Other Orders: Echocardiogram (Echo)  Patient Instructions: 1)  Your physician has requested that you have an echocardiogram.  Echocardiography is a painless test that uses  sound waves to create images of your heart. It provides your doctor with information about the size and shape of your heart and how well your heart's chambers and valves are working.  This procedure takes approximately one hour. There are no restrictions for this procedure. 2)  Follow up in 3 months.

## 2010-02-22 NOTE — Progress Notes (Signed)
Summary: pt calling with new number  Phone Note Call from Patient   Caller: Patient Reason for Call: Talk to Nurse Summary of Call: pt received call this am was told we would call back on work # later today-pls call on this [phone] Initial call taken by: Glynda Jaeger,  February 13, 2010 9:49 AM  Follow-up for Phone Call        Dr [name] aware and will call pt back Meredith Staggers, RN  February 13, 2010 1:25 PM

## 2010-02-28 NOTE — Progress Notes (Signed)
  LOV,Echo faxed to [facility] @ [phone]  February 20, 2010 10:27 AM

## 2010-03-03 ENCOUNTER — Encounter: Payer: Self-pay | Admitting: Internal Medicine

## 2010-03-04 ENCOUNTER — Telehealth: Payer: Self-pay | Admitting: Internal Medicine

## 2010-03-14 NOTE — Letter (Signed)
Summary: stable, check a BMP---[facility]  [facility]   Imported By: Maryln Gottron 03/02/2010 14:08:42  _____________________________________________________________________  External Attachment:    Type:   Image     Comment:   External Document

## 2010-03-14 NOTE — Progress Notes (Signed)
Summary: [facility]: Pt Note  [facility]: Pt Note   Imported By: Earl Many 03/07/2010 16:55:40  _____________________________________________________________________  External Attachment:    Type:   Image     Comment:   External Document

## 2010-03-14 NOTE — Progress Notes (Signed)
Summary: d/c metformin  Phone Note Outgoing Call   Summary of Call: advise patient: I reviewed labs from nephrology , his creatinine is 1.6,slt above what it used to be thus needs to d/c metformin, risk of toxicity. plan: d/c metformin increase amaryl 2mg  from 1/2 to 1 a day watch for low sugars call if CBGs > 180 f/u by April 2012 Anna Beaird E. Jazlyn Tippens MD  March 04, 2010 3:00 PM  Initial call taken by: Nolon Rod. Joliene Salvador MD,  March 04, 2010 2:59 PM  Follow-up for Phone Call        Left message for pt to call back. Army Fossa CMA  March 05, 2010 9:44 AM   Additional Follow-up for Phone Call Additional follow up Details #1::        Left message for pt to call back. Army Fossa CMA  March 06, 2010 11:13 AM  Patient notified. Lucious Groves CMA  March 06, 2010 2:38 PM

## 2010-04-09 ENCOUNTER — Ambulatory Visit: Payer: Self-pay | Admitting: Cardiology

## 2010-04-16 ENCOUNTER — Other Ambulatory Visit: Payer: Self-pay | Admitting: Internal Medicine

## 2010-04-18 ENCOUNTER — Encounter: Payer: Self-pay | Admitting: Cardiology

## 2010-04-23 ENCOUNTER — Encounter: Payer: Self-pay | Admitting: Internal Medicine

## 2010-04-23 ENCOUNTER — Ambulatory Visit (INDEPENDENT_AMBULATORY_CARE_PROVIDER_SITE_OTHER): Payer: Self-pay | Admitting: Internal Medicine

## 2010-04-23 DIAGNOSIS — E039 Hypothyroidism, unspecified: Secondary | ICD-10-CM

## 2010-04-23 DIAGNOSIS — E119 Type 2 diabetes mellitus without complications: Secondary | ICD-10-CM

## 2010-04-23 DIAGNOSIS — I509 Heart failure, unspecified: Secondary | ICD-10-CM

## 2010-04-23 LAB — BASIC METABOLIC PANEL
BUN: 50 mg/dL — ABNORMAL HIGH (ref 6–23)
CO2: 22 mEq/L (ref 19–32)
Calcium: 8.9 mg/dL (ref 8.4–10.5)
Chloride: 102 mEq/L (ref 96–112)
Creatinine, Ser: 1.7 mg/dL — ABNORMAL HIGH (ref 0.4–1.5)
GFR: 45.26 mL/min — ABNORMAL LOW (ref >60.00)
Glucose, Bld: 134 mg/dL — ABNORMAL HIGH (ref 70–99)
Potassium: 4.1 mEq/L (ref 3.5–5.1)
Sodium: 137 mEq/L (ref 135–145)

## 2010-04-23 LAB — TSH: TSH: 1.14 u[IU]/mL (ref 0.35–5.50)

## 2010-04-23 LAB — HEMOGLOBIN A1C: Hgb A1c MFr Bld: 6.9 % — ABNORMAL HIGH (ref 4.6–6.5)

## 2010-04-23 NOTE — Assessment & Plan Note (Addendum)
TSH ~ 4, we could increase his synthroid dose  however the patient feels very well.  labs  As long as it TSH is within normal range, will cont w/ the same medicine

## 2010-04-23 NOTE — Progress Notes (Signed)
  Subjective:    Patient ID: Caleb Bell, male    DOB: [birth year], 55 y.o.   MRN: [id number]  HPI ROV Chart review it, saw cardiology, echocardiogram 1-12 show with an ejection fraction of 30%, Lotensin dose was increased  He also saw nephrology, last creatinine 1.6  Diabetes, based on the results of nephrology, we discontinued metformin (renal failure); he is eating better. Exercise ~ the same  CBGs varies from 200 to 87 depending on diet  No  ambulatory BPs at home, but  good medication compliance  Past Medical History  Diagnosis Date  . Hodgkin's disease      Dx aprox 2004, s/p XRT-Chemo  . CAD (coronary artery disease)     CABG 2003  . Hyperlipidemia     low HDL  . DM type 2 (diabetes mellitus, type 2)   . Hypothyroidism   . Abuse     Alcohol, cocaine- resoloved for many years   . Atrial fibrillation     hx of    . IVCD (intraventricular conduction defect)   . CRI (chronic renal insufficiency)      multifactorial... Dr. Allena Katz.. consult September 18, 2009  . Hyperkalemia     h/o   . CHF (congestive heart failure)     EF 20-25% echo 5- 2011     Review of Systems  Respiratory: Negative for cough and wheezing.   Cardiovascular: Negative for chest pain, palpitations and leg swelling.       Objective:   Physical Exam  Constitutional: He appears well-developed and well-nourished.  Cardiovascular: Normal rate, regular rhythm and normal heart sounds.   No murmur heard. Pulmonary/Chest: Effort normal and breath sounds normal. No respiratory distress. He has no wheezes. He has no rales.  Musculoskeletal: He exhibits no edema.  Psychiatric: He has a normal mood and affect. His behavior is normal. Judgment and thought content normal.          Assessment & Plan:

## 2010-04-23 NOTE — Assessment & Plan Note (Signed)
Last ECHO showed a EF of 30% Card increased  ACEi dose , tolerates well  Labs

## 2010-04-23 NOTE — Assessment & Plan Note (Addendum)
Due to a Cr of 1.6, we d/c metformin; he has improved his diet Labs

## 2010-04-24 ENCOUNTER — Telehealth: Payer: Self-pay | Admitting: *Deleted

## 2010-04-24 NOTE — Telephone Encounter (Signed)
Message left for patient to return my call. Faxed to dr.[email or web address] 

## 2010-04-24 NOTE — Telephone Encounter (Signed)
Message copied by Army Fossa on Tue Apr 24, 2010  9:28 AM ------      Message from: Willow Ora      Created: Tue Apr 24, 2010  8:45 AM       Advise patient:      Diabetes continued to be under good control, continue with same medications and continue working on his diet.      Kidney function is essentially stable. Please fax results to Dr. Allena Katz, the patient's nephrologist.      Thyroid is within normal, continue with same dose of Synthroid

## 2010-04-24 NOTE — Telephone Encounter (Signed)
Pt aware of labs  

## 2010-04-24 NOTE — Telephone Encounter (Signed)
Message copied by Army Fossa on Tue Apr 24, 2010  9:29 AM ------      Message from: Caleb Bell      Created: Tue Apr 24, 2010  8:45 AM       Advise patient:      Diabetes continued to be under good control, continue with same medications and continue working on his diet.      Kidney function is essentially stable. Please fax results to Dr. Allena Katz, the patient's nephrologist.      Thyroid is within normal, continue with same dose of Synthroid

## 2010-04-24 NOTE — Telephone Encounter (Signed)
Message left for patient to return my call. Faxed to dr.[email or web address]

## 2010-05-01 ENCOUNTER — Encounter: Payer: Self-pay | Admitting: Cardiology

## 2010-05-01 DIAGNOSIS — Z923 Personal history of irradiation: Secondary | ICD-10-CM | POA: Insufficient documentation

## 2010-05-01 DIAGNOSIS — E875 Hyperkalemia: Secondary | ICD-10-CM | POA: Insufficient documentation

## 2010-05-02 ENCOUNTER — Ambulatory Visit (INDEPENDENT_AMBULATORY_CARE_PROVIDER_SITE_OTHER): Payer: Self-pay | Admitting: Cardiology

## 2010-05-02 ENCOUNTER — Encounter: Payer: Self-pay | Admitting: Cardiology

## 2010-05-02 DIAGNOSIS — I2510 Atherosclerotic heart disease of native coronary artery without angina pectoris: Secondary | ICD-10-CM

## 2010-05-02 DIAGNOSIS — I509 Heart failure, unspecified: Secondary | ICD-10-CM

## 2010-05-02 DIAGNOSIS — N259 Disorder resulting from impaired renal tubular function, unspecified: Secondary | ICD-10-CM

## 2010-05-02 NOTE — Patient Instructions (Signed)
Your physician wants you to follow-up in: 3 months. You will receive a reminder letter in the mail two months in advance. If you don't receive a letter, please call our office to schedule the follow-up appointment.   

## 2010-05-02 NOTE — Progress Notes (Signed)
HPI Patient is seen for followup of cardiomyopathy.  I saw him last in January.  His followup echo showed some improvement in his EF up to the range of 30-35%.  Unfortunately it has not normalized into better range.  I had adjusted his lisinopril dose.  He remained stable with this.  He has significant renal disease and is being followed.  Is not having any chest pain syncope or presyncope.   No Known Allergies  Current Outpatient Prescriptions  Medication Sig Dispense Refill  . aspirin 325 MG tablet Take 325 mg by mouth daily.        . benazepril (LOTENSIN) 20 MG tablet Take 20 mg by mouth daily.        . carvedilol (COREG) 25 MG tablet Take 25 mg by mouth 2 (two) times daily with meals.        . digoxin (LANOXIN) 0.125 MG tablet Take 125 mcg by mouth daily.        . furosemide (LASIX) 40 MG tablet Take 40 mg by mouth daily.        Marland Kitchen glimepiride (AMARYL) 4 MG tablet Take 4 mg by mouth daily before breakfast.        . levothyroxine (SYNTHROID, LEVOTHROID) 88 MCG tablet TAKE 1 TABLET BY MOUTH DAILY. NEEDS OFFICE VISIT.  30 tablet  0  . simvastatin (ZOCOR) 40 MG tablet Take 40 mg by mouth at bedtime.        . metFORMIN (GLUCOPHAGE) 850 MG tablet Take 850 mg by mouth 3 (three) times daily.          History   Social History  . Marital Status: Single    Spouse Name: N/A    Number of Children: 0  . Years of Education: N/A   Occupational History  .     Social History Main Topics  . Smoking status: Never Smoker   . Smokeless tobacco: Not on file  . Alcohol Use: No  . Drug Use: No     only in the past  . Sexually Active: Not on file   Other Topics Concern  . Not on file   Social History Narrative   Regular Exercise-- yes (walking daily)     Family History  Problem Relation Age of Onset  . Adopted: Yes    Past Medical History  Diagnosis Date  . Hodgkin's disease      Dx aprox 2004, s/p XRT-Chemo  . CAD (coronary artery disease)     CABG 2003  . Hyperlipidemia     low  HDL  . DM type 2 (diabetes mellitus, type 2)   . Hypothyroidism   . Abuse     Alcohol, cocaine- resoloved for many years   . Atrial fibrillation     Paroxysmal, limited  . IVCD (intraventricular conduction defect)   . CRI (chronic renal insufficiency)      multifactorial... Dr. Allena Brandilee Pies.. consult September 18, 2009  . Hyperkalemia     August, 2011, Aldactone, Aldactone stopped  . CHF (congestive heart failure)     EF 20-25% echo 5- 2011  . Ejection fraction < 50%     EF 20-25%, echo, May, 2011 / ejection fraction 30-35%, echo, January, 2012  . Hx of radiation therapy     For Hodgkin's disease  . Hx of CABG     2003    Past Surgical History  Procedure Date  . Tonsillectomy   . Coronary artery bypass graft     ROS  Patient  denies fever, chills, headache, sweats, rash, change in vision, change in hearing, chest pain, cough, nausea vomiting, urinary symptoms.  All other systems are reviewed and are negative.  PHYSICAL EXAM He looks good today.  He is oriented to person time and place.  Affect Is normal.  Head is atraumatic.  There is no carotid bruits.  Lungs are clear.  Respiratory effort is unlabored.  Cardiac exam reveals S1-S2.  No clicks or significant murmurs.  The abdomen is soft.  There is no peripheral edema.  Filed Vitals:   05/02/10 0929  BP: 88/54  Pulse: 51  Height: 6\' 1"  (1.854 m)  Weight: 203 lb (92.08 kg)    EKG  EKG reveals sinus bradycardia.  There is an interventricular conduction delay of a nonspecific type. This is unchanged from the past.  ASSESSMENT & PLAN

## 2010-05-02 NOTE — Assessment & Plan Note (Signed)
Coronary disease is stable.  He is not been recathed stents I noted his lower ejection fraction.  We considered over time.

## 2010-05-02 NOTE — Assessment & Plan Note (Signed)
The patient has a significant cardiomyopathy.  He is tolerating the higher dose of ACE inhibitor but his blood pressure is low.  He's not having significant symptoms.  His meds have been fully adjusted.  He developed increased potassium and Aldactone in the past.  I've chosen not to retry at this point.  I had a long careful discussion with the patient today to discuss the possibility of placing an ICD.  He and I agreed that we would follow his LV function a little further.  I will then have to decide if an ischemic workup needs to be done.  Also have to decide if the mother should be done to absolutely assess his ejection fraction.

## 2010-05-02 NOTE — Assessment & Plan Note (Signed)
This is being carefully followed by Dr. Allena Lasasha Brophy.

## 2010-05-03 ENCOUNTER — Encounter: Payer: Self-pay | Admitting: Cardiology

## 2010-05-03 NOTE — Progress Notes (Signed)
Addended by: Celestia Khat on: 05/03/2010 11:05 AM   Modules accepted: Orders

## 2010-05-04 NOTE — Progress Notes (Signed)
Addended by: Celestia Khat on: 05/04/2010 04:19 PM   Modules accepted: Orders

## 2010-05-18 ENCOUNTER — Other Ambulatory Visit: Payer: Self-pay | Admitting: Cardiology

## 2010-05-18 ENCOUNTER — Other Ambulatory Visit: Payer: Self-pay | Admitting: Internal Medicine

## 2010-06-05 NOTE — Assessment & Plan Note (Signed)
[facility]                            CARDIOLOGY OFFICE NOTE   NAME:Steinhaus, Gretchen Short                        MRN:          [id number]  DATE:04/09/2007                            DOB:          [birth year]    Mr. Krapf is doing well.  He is post CABG.  He is doing well.  He is  exercising regularly.  He is on all the appropriate medications.  His  blood pressure is under good control.  He is not smoking.   PAST MEDICAL HISTORY:   ALLERGIES:  No known drug allergies.   MEDICATIONS:  Glimepiride, thyroid, benazepril, metoprolol, Digitek,  Furosemide, metformin, aspirin and Pravachol.   OTHER MEDICAL PROBLEMS:  See the list below.   REVIEW OF SYSTEMS:  He feels well.  Review of systems otherwise is  negative.   PHYSICAL EXAM:  Blood pressure is 115/72 with a pulse of 66.  The patient is oriented to person, time and place.  Affect is normal.  HEENT:  Reveals no xanthelasma.  Normal extraocular motion.  There are no carotid bruits.  There is no jugular venous distention.  Lungs are clear.  Respiratory effort is not labored.  Cardiac exam reveals S1-S2.  There are no clicks or significant murmurs.  The abdomen is soft.  There is no peripheral edema.   PROBLEMS:  1. History of ischemic cardiomyopathy.  Ejection fraction previously      was in the 40% range.  He is on the appropriate medications.  He      does not need any further studies at this time.  2. 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  3. Diabetes.  4. History of Hodgkin's disease, treated.  5. History of thyroid disease, treated.  6. Hypercholesterolemia.  He is on medications.  The goal would be to      have his LDL at 70.   The EKG reveals normal sinus rhythm with an old interventricular  conduction delay.   Mr. Candela is stable.  I can see him in 2 years for followup.     Luis Abed, MD, [facility]  Electronically Signed    JDK/MedQ  DD: 04/09/2007  DT: 04/09/2007  Job #:  [id number]   cc:   Willow Ora, MD

## 2010-06-08 NOTE — Discharge Summary (Signed)
NAME:  Caleb Bell, Caleb Bell NO.:  [id number]   MEDICAL RECORD NO.:  [id number]                   PATIENT TYPE:  INP   LOCATION:  2039                                 FACILITY:  [facility]   PHYSICIAN:  [name], [name]              DATE OF BIRTH:  May 20, 1955   DATE OF ADMISSION:  09/29/2001  DATE OF DISCHARGE:  10/06/2001                                 DISCHARGE SUMMARY   ADMISSION DIAGNOSIS:  Coronary artery disease.   SECONDARY DIAGNOSES::  1. History of myocardial infarction.  2. Postoperative anemia secondary to blood loss.  3. Postoperative atrial fibrillation.   DISCHARGE DIAGNOSIS:  Coronary artery disease.   HOSPITAL COURSE:  The patient was admitted to [facility] on  September 29, 2001 secondary to known history of three-vessel coronary artery  disease. On date of admission Dr. Cornelius Moras performed a coronary bypass graft x5  with a left internal mammary artery anastomosis to the left anterior  descending artery with saphenous vein graft to the proximal posterior  descending artery and the distal posterior descending artery saphenous vein  graft to the second obtuse marginal artery and a saphenous vein graft to the  first diagonal. Dr. Cornelius Moras also performed a left anterior descending artery  endarterectomy at that time. No complications noted during procedure.  Postoperatively, the patient  was started on Plavix secondary to his  coronary endarterectomy. He had postoperative course complicated by atrial  fibrillation which was treated with amiodarone. The patient converted back  to normal sinus rhythm following administration of this medication. He has  history of diabetes mellitus which was well controlled with sliding scale  insulin and Actos. Remainder of his hospital course remained uneventful and  he subsequently deemed stable for discharge home on October 05, 2001.   MEDICATIONS AT TIME OF DISCHARGE::  1. Aspirin 325 mg one  daily.  2. Coreg 3.125 mg two tablets twice daily.  3. Amiodarone 200 mg two tablets daily.  4. Actos 15 mg one daily.  5. Zocor 20 mg one daily.  6. Plavix 75 mg one daily.  7. Lexapro 10 mg one daily.  8. Protonix 40 mg one daily.  9. Keflex 500 mg one tablet three times daily for 10 days.  10.      Ultram 50 mg one to two tablets every four six hours as needed for     pain.   ACTIVITY:  The patient was told no driving or strenuous activity or lifting  objects. Told to walk daily and continue breathing exercises.   DISCHARGE DIET:  Low fat, low cholesterol.   WOUND CARE::  The patient was told he could shower and clean his incisions  with soap and water.   DISPOSITION:  Home.   FOLLOW UP:  The patient  was told to call his cardiologist Dr. [name] for a  two week appointment. He is told to see  Dr. Cornelius Moras on Monday October 26, 2001  at 9:45 a.m. He was told to bring his chest x-ray with him.     Levin Erp. Steward, P.A.                      [name], [name]    BGS/MEDQ  D:  10/05/2001  T:  10/06/2001  Job:  [id number]   cc:   [facility]

## 2010-06-08 NOTE — Discharge Summary (Signed)
NAME:  KIMI, KROFT                        ACCOUNT NO.:  [id number]   MEDICAL RECORD NO.:  [id number]                   PATIENT TYPE:  INP   LOCATION:  5707                                 FACILITY:  [facility]   PHYSICIAN:  Kinnie Scales. Annalee Genta, [name]            DATE OF BIRTH:  03-15-1955   DATE OF ADMISSION:  11/12/2001  DATE OF DISCHARGE:  11/13/2001                                 DISCHARGE SUMMARY   ATTENDING PHYSICIAN:  Kinnie Scales. Annalee Genta, [name]   ADMISSION DIAGNOSES:  1. Right deep neck mass.  2. Coronary artery disease, status post recent coronary artery bypass     grafting (09/2001).  3. Non-insulin dependent diabetes.   DISCHARGE DIAGNOSES:  1. Right deep neck mass.  2. Coronary artery disease, status post recent coronary artery bypass     grafting (09/2001).  3. Non-insulin dependent diabetes.   SURGICAL PROCEDURE THIS ADMISSION:  Incisional biopsy, right deep neck mass  (11/12/2001).   The patient is discharged to home in stable condition in the company of his  family.   POSTOPERATIVE MEDICATIONS:  Include Augmentin 500 mg p.o. b.i.d. for 10 days  and Lortab 5/500 1-to-2 tablets every 4-to-6 hours as needed; dispense 30  without refills.  The patient is also discharged on his admission  medications, which were not changed, which include Coreg 3/25 two tablets  p.o. b.i.d., Actos 15 mg p.o. q. Day, Zocor 50 mg p.o. q. Day, Plavix 75 mg  p.o. q. Day, Lexapro 10 mg p.o. q. Day, Protonix 40 mg p.o. q. Day, Digoxin  0.125 mg p.o. q. Day, Altace 2.5 mg p.o. q. Day, and Lasix 40 mg p.o. q.  Day.   ACTIVITY:  Limited; no lifting or straining.   DIET:  No restrictions.   WOUND CARE:  Half strength hydrogen peroxide applied to the surgical  incision followed by Bacitracin ointment on a twice daily basis.   The patient is scheduled to followup in my office on 11/19/2001, for  postoperative care.   BRIEF HISTORY:  The patient is a 55 year old white male who is referred  by  Dr. Ashley Mariner of cardiovascular thoracic service (CVTS) for evaluation of a  right superior neck mass.  The patient had noted a gradually enlarging mass  in the right neck.  He has been worked up for coronary artery bypass  grafting and CT scan performed at that time showed a soft tissue mass  consistent with significantly enlarged lymph node in the superior jugular  digastric chain.  Given his cardiac status, we opted to undertake cardiac  surgery prior to any biopsy or intervention.  Approximately six weeks after  his bypass grafting, the patient was approved for general anesthesia, and  the above surgical procedure was scheduled.  Prior to surgery, cardiac  clearance was obtained from the patient's cardiologist, Dr. [name].  The  risks, benefits and possible complications of incisional biopsy of deep neck  mass were discussed in detail with the patient and his family.  They  understood and concurred with our plan for surgery which was scheduled on  11/12/2001.   HOSPITAL COURSE:  The patient was admitted to [facility] on  11/12/2001, on the ENT service under Dr. Thurmon Fair care.  He was taken  from day admissions to the main operating room at [facility], and  underwent incisional biopsy of a right deep neck mass under general  anesthesia.  Surgical procedure was uneventful, and the patient was  transferred from the operating room to recovery.  There were no cardiac  complications.  The patient had normal heart rate and blood pressure through  the surgical procedure.  He was observed in recovery, and given his cardiac  status, we opted to admit him to the hospital for over night observation for  chest pain or change in hemodynamic status.  The patient's hospital course  was uneventful.  On the first postoperative morning he is tolerating normal  oral diet without difficulty.  He denied chest pain, and his chest was clear  to auscultation.  Surgical incision was  dry and intact without erythema.  There was a moderate amount of swelling at the superior aspect of the right  neck, but no evidence of bleeding or other abnormality, and the patient felt  comfortable with minimal pain.   He was discharged to home with the above discharge instructions.  The  patient is comfortable with discharge planning.  He is tolerating a normal  diet, and has normal bowel and bladder function.  He will followup in my  office in one week for postoperative care, review of pathology and removal  of suture material, or sooner if warranted.                                               Kinnie Scales. Annalee Genta, [name]    DLS/MEDQ  D:  54/09/8117  T:  11/14/2001  Job:  [id number]   cc:   [name], MD [facility]   Angelena Sole, [name] [facility]   Dr. Ashley Mariner

## 2010-06-08 NOTE — Cardiovascular Report (Signed)
[place]. [facility]  Patient:    Caleb Bell, Caleb Bell Visit Number: [id number] MRN: [id number]          Service Type: MED Location: [id number] Attending Physician:  Talitha Givens Dictated by:   Rollene Rotunda, [name] [facility] Proc. Date: 07/06/01 Admit Date:  07/03/2001   CC:         Angelena Sole, [name] [facility]  Luis Abed, [name] [facility]   Cardiac Catheterization  PROCEDURE:  Left heart catheterization/coronary arteriography.  CARDIOLOGIST:  Rollene Rotunda, [name] [facility]  INDICATION:  Evaluate patient with progressive shortness of breath and newly diagnosed cardiomyopathy.  PROCEDURAL NOTE:  Left heart catheterization was performed via the right femoral artery.  The artery was cannulated using anterior wall puncture.  A #6 French arterial sheath was inserted via the modified Seldinger technique. Preformed Judkins and a pigtail catheter were utilized.  The patient tolerated the procedure well and left the lab in stable condition.  RESULTS:  HEMODYNAMICS:  LV: 95/11, AO: 95/59.  CORONARIES:  The left main was normal.  Left anterior descending artery:  The LAD had proximal long 60 to 70% stenosis and a distal 70% stenosis.  The first diagonal was small with mid subtotal occlusion.  The second diagonal was small with diffuse disease.  A third diagonal was medium size with proximal subtotal stenosis.  Circumflex artery: The circumflex was proximally occluded with LAD and circumflex collaterals.  There were two large marginals seen to fill via these collaterals.  Right coronary artery:  The right coronary artery was a dominant or codominant system.  There was proximal 60% stenosis.  There was a proximal shelf-like 40% stenosis followed by a long mid 40% stenosis.  The PDA had a 99% stenosis in the mid segment.  LEFT VENTRICULOGRAM:  A left ventriculogram was obtained in the RAO and LAO projections.  The EF was 20 to 25% with inferior akinesis,  anterolateral and lateral akinesis.  CONCLUSION: 1. Three-vessel coronary artery disease. 2. Ischemic cardiomyopathy with severe left ventricular dysfunction.  PLAN:  This is a difficult situation.  The patient is at high risk for revascularization with his reduced ejection fraction.  However, if he has stenosis or occlusion in the future of his LAD, he would not survive this. For now, pursue medical management, though we are limited by his hypotension. I will review the films with my colleagues to decide between proceeding with consideration of high-risk revascularization versus medical management. Dictated by:   Rollene Rotunda, [name] [facility] Attending Physician:  Talitha Givens DD:  07/06/01 TD:  07/07/01 Job: [id number]

## 2010-06-08 NOTE — Discharge Summary (Signed)
[place]. [facility]  Patient:    Caleb Bell, Caleb Bell Visit Number: [id number] MRN: [id number]          Service Type: MED Location: 3700 [id number] Attending Physician:  Talitha Givens Dictated by:   Cornell Barman, P.A. Admit Date:  07/03/2001 Discharge Date: 07/09/2001   CC:         Luis Abed, [name]  Angelena Sole, [name]   Discharge Summary  DISCHARGE DIAGNOSES: 1. Dyspnea on exertion. 2. Three vessel coronary artery disease. 3. Left ventricular dysfunction. 4. New onset diabetes mellitus. 5. Polysubstance abuse.  HISTORY OF PRESENT ILLNESS:  Mr. Alcorn is a 55 year old white male without any prior cardiac history.  He does have a history of substance abuse, including crack cocaine.  The patient denied any use of alcohol, cocaine, or marijuana since the Tuesday prior to this admission.  The patient was referred by Dr. Ruthine Dose for evaluation of new onset of dyspnea on exertion.  PAST MEDICAL HISTORY:  Unremarkable.  HOSPITAL COURSE:  #1 - CARDIOVASCULAR:  The patient presented with dyspnea on exertion.  He also had an abnormal EKG on admission with a question of an inferolateral myocardial infarction.  The patient was admitted by cardiology.  They recommended a 2-D echocardiogram.  The 2-D echocardiogram revealed an ejection fraction of 25 to 30% with posterior akinesis.  At that point, cardiac catheterization was scheduled.  Dr. Antoine Poche performed a cardiac catheterization on 07/06/01.  This revealed a left main to be normal, left anterior descending artery revealed proximal 60 to 70% stenosis and a distal 70% stenosis, circumflex was proximally occluded with left anterior descending artery and circumflex collaterals.  Right coronary artery revealed proximal 60% stenosis.  Posterior descending coronary artery had a 99% stenosis. Overall impression was three vessel coronary artery disease with ischemic cardiomyopathy with an ejection  fraction of 20 to 25%.  He did have inferior akinesis and anterolateral akinesis.  It was thought the patient might need revascularization surgery.  However, the patient was adamant that he not have surgery.  Dr. Eden Emms commented that percutaneous transluminal coronary angioplasty in a diabetic with a decreased ejection fraction was risky with poor long-term benefits.  They recommended checking a resting thallium.  This showed multiple scars indicating that even revascularization surgery might not improve his left ventricular function.  At this time, we plan on medically managing the patient.  Currently, the patients congestive heart failure has resolved, he has diuresed approximately 10 pounds.  We will continue the Lasix at discharge.  Because of his relative hypotension, we will not start an ACE inhibitor or a beta blocker at this time.  It is thought though that he would benefit from both of these medications if he proves to be compliant with his overall treatment plan.  Dr. [name] stated that Dr. Ruthine Dose could manage these medications if she saw that he was being compliant with his treatment.  She could therefore initiate his ACE and beta blocker.  #2 - HYPERCHOLESTEROLEMIA:  The patients total cholesterol was 207, triglycerides were 121, HDL 35, and LDL 148.  Again, he would benefit from a statin, but we did not initiate this drug during this hospitalization.  Again, this can be deferred to his primary physician if he indeed shows compliance with treatment.  #3 - ADULT ONSET DIABETES MELLITUS:  This is a new diagnosis.  Hemoglobin A1C was elevated at 8.5%.  We have achieved good glycemic control in the hospital on an ADA diet  and Actos.  The patient seems eager to make some lifestyle changes, and plans on following up with [facility] after discharge.  #4 - POLYSUBSTANCE ABUSE:  Again, the patient states that he is interested in making lifestyle changes.   Case management has talked with him.  He states that he really is not interested in a 12 step program at this time, but he states he will do whatever it takes to get clean and sober.  The patient has been given a teaching sheet on substance abuse, along with contact numbers for local substance abuse treatment programs.  Again, this is something the patient will have to arrange for himself.  DISCHARGE LABORATORY DATA:  BUN 12, creatinine 0.7.  CBC was normal.  Coags were normal.  D-dimer was elevated at 1.37.  Lipid profile revealed cholesterol of 207, triglycerides 121, HDL 35, LDL 148.  TSH was 2.65.  Urine drug screen on admission revealed cocaine.  DISCHARGE MEDICATIONS: 1. Aspirin 325 mg q.d. 2. Lasix 40 mg q.d. 3. Lexapro 10 mg q.d. 4. Digoxin 0.125 mg q.d. 5. Actos 15 mg q.d.  Again, at the discretion of Dr. Ruthine Dose, the patient should be started on an ACE inhibitor, a beta blocker, and a statin.  FOLLOWUP: 1. The patient should follow up with Dr. Ruthine Dose in one to two weeks. 2. Dr. [name] in two to three months.  Dr. Ruthine Dose can arrange this followup.  DISCHARGE INSTRUCTIONS:  The patient has been instructed to check his blood sugars at varying times daily before breakfast, lunch, and dinner.  He is to weigh himself two to three times a week and notify physician for a weight gain.  The patients weight at discharge was 185.8 pounds. Dictated by:   Cornell Barman, P.A. Attending Physician:  Talitha Givens DD:  07/09/01 TD:  07/10/01 Job: [id number]

## 2010-06-08 NOTE — Op Note (Signed)
NAME:  Caleb Bell, Caleb Bell                        ACCOUNT NO.:  [id number]   MEDICAL RECORD NO.:  [id number]                   PATIENT TYPE:  INP   LOCATION:  2310                                 FACILITY:  [facility]   PHYSICIAN:  [name], [name]              DATE OF BIRTH:  Jan 05, 1956   DATE OF PROCEDURE:  09/29/2001  DATE OF DISCHARGE:                                 OPERATIVE REPORT   PREOPERATIVE DIAGNOSIS:  Severe three-vessel coronary artery disease status  post acute myocardial infarction with ischemic cardiomyopathy.   POSTOPERATIVE DIAGNOSIS:  Severe three-vessel coronary artery disease status  post acute myocardial infarction with ischemic cardiomyopathy.   PROCEDURE:  Median sternotomy for coronary artery bypass grafting x five and  coronary artery endarterectomy (left internal mammary artery to distal left  anterior descending coronary artery with endarterectomy, saphenous vein  graft to first diagonal branch, saphenous vein graft to second circumflex  marginal branch, saphenous vein graft to proximal posterior descending  coronary artery and sequential saphenous vein graft to distal posterior  descending coronary artery).   SURGEON:  [name], [name]   ASSISTANT:  Balinda Quails, [name]   SECOND ASSISTANT:  Maple Mirza, P.A.   ANESTHESIA:  General.   BRIEF CLINICAL NOTE:  The patient is a 55 year old white male referred by  Dr. [name] and followed by Dr. [name] for management of coronary  artery disease.  The patient initially presented in June of this year with  an acute non-Q wave myocardial infarction.  Echocardiogram and cardiac  catheter performed at that time demonstrated severe left ventricular  dysfunction with ischemic cardiomyopathy and severe three-vessel coronary  artery disease.  At the time the patient had continued with a history of  substance abuse, and therefore he was treated medically and referred for  aggressive  rehabilitation.  He has done remarkably well since then, and  following further review by Dr. [name], he was referred for possible elective  surgical revascularization.  The patient was initially seen in consultation  on September 14, 2001, and a full consultation note has been dictated  previously.  The patient has been counseled at length regarding the  indications, risks, and potential benefits of coronary artery bypass  grafting.  All of his questions have been addressed.  He understands and  accepts all associated risks of surgery including but not limited to risk of  death, stroke, myocardial infarction, congestive heart failure, respiratory  failure, bleeding requiring blood transfusion, arrhythmia, infection, and  recurrent coronary artery disease.   OPERATIVE NOTE IN DETAIL:  The patient was brought to the operating room on  the above-mentioned date and central monitoring was established by the  anesthesia service under the care and direction of Dr. [name].  Specifically, a Swan-Ganz catheter was placed through the right subclavian  approach.  A left radial arterial line is placed.  Intravenous antibiotics  are administered.  Following induction with general endotracheal anesthesia,  a Foley catheter is placed.   Baseline transesophageal echocardiogram is performed by Dr. [name].  This  confirms the presence of dilated ischemic cardiomyopathy with severe global  left ventricular dysfunction.  Overall ejection fraction is estimated to be  between 20 and 25%.  There is akinesis of the entire inferior wall and  posterolateral wall.  There is hypokinesis of the remainder of the heart.  There is trace to mild mitral regurgitation.  No other significant  abnormalities are identified.   The patient's chest, abdomen, both groins, and both lower extremities are  prepared and draped in a sterile manner.  A median sternotomy incision is  performed and the left internal mammary artery  dissected from the chest wall  and prepared for bypass grafting.  The left internal mammary artery is a  notably good quality conduit.  Simultaneously the saphenous vein was  obtained from the patient's right lower extremity through a series of  longitudinal incisions.  The saphenous vein is a notably good quality  conduit.  The patient is heparinized systemically.   The pericardium is opened.  There are some adhesions between the visceral  and parietal surfaces of the pericardium particularly overlying the distal  anterior wall and the apex of the heart, consistent with remote history of  Dressler's syndrome.  These are divided with sharp dissection.  There is  obvious diffuse ischemic cardiomyopathy with severe scarring in the distal  anterior wall, apex, and the entire inferior and posterolateral walls.  The  ascending aorta is cannulated for cardiopulmonary bypass.  A venous cannula  was placed through the tip of the right atrial appendage with the tip of the  cannula extending down the inferior vena cava.  A retrograde cardioplegia  catheter is placed through the right atrium into the coronary sinus.  Adequate heparinization is verified.   Cardiopulmonary bypass is begun and the surface of the heart is inspected.  The remainder of the adhesions between the surfaces of the pericardium are  divided sharply to facilitate complete mobilization.  The epicardial  coronary arteries are not visible and for the most part are intramyocardial.  There is evidence of old Dressler's with scarring throughout the epicardial  surface.  There is diffuse coronary artery disease with very poor target for  grafting.  The heart is dilated and severely dysfunction.  A cardioplegia  catheter is placed in the ascending aorta.  A temperature probe is placed in  the left ventricular septum.  Portions of the saphenous vein and the left  internal mammary artery are trimmed to appropriate length.  The patient  is cooled to 28-degrees systemic temperature.  The aortic  crossclamp is applied and cardioplegia is delivered initially in an  antegrade fashion through the aortic root.  Iced saline slush is applied for  topical hypothermia.  Supplemental cardioplegia is administered retrograde  through the coronary sinus catheter to facilitate complete diastolic arrest  and excellent myocardial cooling.  The initial cardioplegic arrest was felt  to be excellent.  Repeat doses of cardioplegia are administered  intermittently throughout the crossclamp portion of the operation, antegrade  through the aortic root, antegrade down subsequently placed vein grafts, and  retrograde through the coronary sinus catheter to maintain septal  temperature below 15-degrees centigrade.  The following distal coronary  anastomoses are performed:   1. The proximal posterior descending coronary artery is grafted with the     saphenous vein graft in a side-to-side fashion.  This coronary artery     measures 1.2 mm in diameter and is diffusely disease and of poor quality.     There is high-grade stenosis in the mid portion of the posterior     descending coronary artery as well beyond this level.   1. The distal posterior descending coronary artery, beyond the level of the     high-grade obstruction, is grafted using the sequential saphenous vein     graft off of the vein, placed in the proximal portion of the posterior     descending coronary artery.  This coronary measures 1.0 mm in diameter at     the site of distal bypass and is of poor quality.   1. The second circumflex marginal branch is grafted with the saphenous vein     graft in an end-to-side fashion.  This coronary artery is chronically     occluded and appears to have a bifurcated system.  The most lateral     branch is the sub branch which is grafted.  This coronary artery is     diffusely diseased and measures 1.5 mm in diameter.  It is of poor     quality.   The anastomosis is initially completed in an end-to-side     fashion.  However, plaque within the wall of the vessel at this location     ruptures during construction of the distal anastomosis.  Because of the     split plaque in the wall of the vessel, this is felt not to be     salvageable without complete reconstruction of the distal anastomosis.     Therefore, the vein graft is amputated several millimeters above the     level of the distal anastomosis and the remaining stump of the saphenous     vein is oversewn to create a large vein patch in the vessel.  A new site     more distal is the circumflex marginal branch is then chosen for the     distal anastomosis.  This is constructed in an end-to-side fashion in the     usual manner.  This coronary artery remains of poor quality.  A 1.0 probe     was passed in both directions at the completion of the anastomosis.   1. The first diagonal branch off the left anterior descending coronary    artery is grafted with the saphenous vein graft in an end-to-side     fashion.  This coronary measures 1.0 mm in diameter and is of very poor     quality.  It is essentially not a graftable target, although a 1.0 probe     will barely pass in both directions after completion of the anastomosis.     This is the only graftable target in the anterolateral wall.   1. The distal left anterior descending coronary artery is grafted with the     left internal mammary artery in an end-to-side fashion.  This coronary     artery is also diffusely diseased and no satisfactory site for distal     anastomosis can be identified.  Ultimately, the artery is opened in two     places.  The proximal site of opening of the vessel appears to be the     most satisfactory, although there is diffuse plaque and an endarterectomy     is required.  This is performed using endarterectomy instruments with an     evaginating endarterectomy technique.  The distal  end of the      endarterectomy specimen appears to feather appropriately and is beyond     the level of the second arteriotomy.  The distal anastomosis is completed     uneventfully.  The second arteriotomy is closed using a small elliptical     saphenous vein patch.  All three proximal saphenous vein anastomoses are     performed directly to the ascending aorta prior to removal of the aortic     crossclamp.  The septal temperature is noted to rise rapidly and     appropriately upon reperfusion of the left internal mammary artery.  One     final dose of warm retrograde hotshot cardioplegia was administered.  The     aortic crossclamp is removed after a total crossclamp time of 181     minutes.   The heart begins to beat spontaneously without need for cardioversion.  All  proximal and distal anastomoses are inspected for hemostasis and appropriate  graft orientation.  Epicardial pacing wires are affixed to the right  ventricular outflow tract into the right atrial appendage.  The patient was  rewarmed greater than 37-degrees centigrade temperature.  During rewarming,  the patient is loaded with intravenous Milrinone, and low-dose dopamine.  The patient is weaned from cardiopulmonary bypass without difficulty.  The  patient's rhythm at separation from bypass is normal sinus rhythm.  Total  cardiopulmonary bypass time for the operation is 277 minutes.   Followup transesophageal echocardiogram performed by Dr. [name] after  separation from bypass demonstrates essentially preserved left ventricular  function with somewhat more hyperdynamic appearing anterior wall.  Overall  left ventricular function is not significantly changed but it remains  preserved.  There is no mitral regurgitation.  No other abnormalities are  noted.  The patient remained hemodynamically stable throughout the remainder  of the operation, and cardiac output averages between 6.0 and 7.5 liters per  minute throughout the remainder of the  procedure.  The venous and arterial cannulae are removed uneventfully, as is the  retrograde cardioplegia catheter.  The mediastinum and the left chest are  irrigated with saline solution containing vancomycin.  Protamine is  administered to reverse the anticoagulation.  The patient is transfused one  ten pack of adult platelets and two units fresh frozen plasma due to  coagulopathy.  The mediastinum and the left chest are drained with three  chest tubes placed through separate stab incisions inferiorly.  The patient  did develop two brief episodes of sustained ventricular tachycardia during  the early postbypass portion of the operation requiring synchronized DC  cardioversion.  Following this the patient was begun on continuous infusion  of amiodarone.  The patient has no further episodes of dysrhythmia during  the remainder of the procedure.  The median sternotomy is closed in a  routine fashion.  The right lower extremity incision is closed in multiple  layers in a routine fashion.  All skin incisions are closed with  subcuticular skin closures.   The patient tolerated the procedure well and is transported to surgical  intensive care unit in stable condition.  There are no intraoperative  complications.  All sponge, instrument and needle counts are verified  correct at completion of the operation.   Of particular note is that this patient would likely not be an appropriate  candidate for redo coronary artery bypass grafting due to the diffuse nature  of his coronary artery disease.  [name], [name]    CHO/MEDQ  D:  09/29/2001  T:  09/30/2001  Job:  [id number]   cc:   Luis Abed, [name] [facility]   Angelena Sole, [name] [facility]

## 2010-06-08 NOTE — Op Note (Signed)
   NAME:  Caleb Bell, Caleb Bell                        ACCOUNT NO.:  [id number]   MEDICAL RECORD NO.:  [id number]                   PATIENT TYPE:  INP   LOCATION:  2310                                 FACILITY:  [facility]   PHYSICIAN:  [name], [name]                     DATE OF BIRTH:  [birth year]   DATE OF PROCEDURE:  09/29/2001  DATE OF DISCHARGE:                                 OPERATIVE REPORT   PROCEDURE:  Transesophageal echocardiography.   ANESTHESIOLOGIST:  [name], [name]   DESCRIPTION OF PROCEDURE:  The patient was induced under general anesthesia.  Oral endotracheal tube was placed to secure the airway.  A transesophageal  probe was heavily lubricated and passed down the oropharynx to 40 cm mark.  It remained there throughout the case for various axial views.  During the  bypass period, the probe was left in the neutral unflexed position.  Examination revealed the left ventricle to be moderately dilated.  There  were segmental defects of an akinetic posterior inferior wall with some  extension into the lateral wall.  The left atrium was slightly enlarged.  The septum was intact.  Mitral valve had a normal appearance.  The valve  appeared to be coapting in the valvular plane.  Color Doppler revealed 1+  central regurgitant pattern.  The aortic valve had three leaflets.  It was  normal in appearance and function.  No calcium was noted.  Color Doppler  revealed no insufficiency.  Tricuspid valve was also normal.  An  insignificant amount of regurgitant flow was noted with the pulmonary artery  catheter across the valve.  The right side of the heart was normal in  appearance.  The patient was placed on cardiopulmonary bypass and underwent  coronary artery bypass grafting.                                               [name], [name]    LK/MEDQ  D:  09/29/2001  T:  09/30/2001  Job:  [id number]

## 2010-06-08 NOTE — Op Note (Signed)
NAME:  Caleb Bell, Caleb Bell                        ACCOUNT NO.:  [id number]   MEDICAL RECORD NO.:  [id number]                   PATIENT TYPE:  INP   LOCATION:  2858                                 FACILITY:  [facility]   PHYSICIAN:  Caleb Bell, [name]            DATE OF BIRTH:  04-16-1955   DATE OF PROCEDURE:  11/12/2001  DATE OF DISCHARGE:                                 OPERATIVE REPORT   PREOPERATIVE DIAGNOSES:  1. Right deep neck mass.  2. History of severe coronary artery disease, status post coronary artery     bypass grafting, September 2003.   POSTOPERATIVE DIAGNOSES:  1. Right deep neck mass.  2. History of severe coronary artery disease, status post coronary artery     bypass grafting, September 2003.   OPERATION PERFORMED:  Incisional biopsy of right deep neck mass.   SURGEON:  Caleb Bell, [name]   ANESTHESIA:  General endotracheal.   ESTIMATED BLOOD LOSS:  Minimal.   COMPLICATIONS:  None.   The patient was stable from a cardiovascular standpoint throughout the  procedure and was transferred from the operating room to the recovery room  in stable condition.   INDICATIONS FOR PROCEDURE:  The patient is a 55 year old white male who was  referred by his cardiovascular surgeon by Caleb Bell, [name] for  evaluation of right superior neck mass.  The patient had drug induced  cardiomyopathy and severe coronary artery disease and underwent coronary  artery bypass grafting by Dr. Cornelius Bell in September 2003.  In the preoperative  work-up, the patient was noted to have a large right deep neck mass.  CT  scan of the neck and chest showed a soft tissue mass consistent with an  enlarged lymph node within the deep aspect of the right neck deep to the  sternocleidomastoid muscle.  Evaluation in my office preoperatively revealed  no evidence of mucosal lesion, ulceration or mass and findings on CT scan  were consistent with a possible lymph node tumor.  The patient  underwent  uneventful coronary artery bypass grafting and began his rehabilitation.  He  was seen in follow-up and now approximately six weeks postbypass he was  cleared by his cardiologist, Dr. Jerral Bell for general anesthesia and  incisional biopsy of the right deep neck mass.  The risks, benefits and  possible complications of the surgical procedure were discussed in detail  with the patient, who understood and concurred with our plan for surgery  which was scheduled for November 12, 2001 under general anesthesia at [facility] main operating room.   DESCRIPTION OF PROCEDURE:  The patient was brought to the operating room on  November 12, 2001 and placed in supine position on the operating table.  General endotracheal anesthesia was established without difficulty and with  the patient adequately anesthetized, he was prepped and draped in sterile  fashion and positioned on the operating table.  A  3 cm horizontal skin  incision was created with a #15 scalpel in the midaspect of the right neck.  This was carried through the skin and underlying subcutaneous tissues.  Platysma muscle was identified and divided and small superior, inferior  subplatysmal flaps were developed.  The anterior border of the  sternocleidomastoid muscle was identified.  It was separated from the  surrounding fibrofatty tissues and the sternocleidomastoid muscle was  reflected posteriorly.  This allowed access to a palpable mass within the  deep aspect of the neck measuring approximately 4 x 3 cm and extending along  the jugular vein and vascular bundle deep to the sternocleidomastoid muscle.  The inferior aspect of the mass was dissected from the surrounding fascial  tissue and an approximately 1 cm portion of the mass was dissected with  bipolar scissors.  The mass was sent to pathology for gross and microscopic  evaluation.  Several areas of hemorrhage along the mass itself were  cauterized and a small  suture ligature was placed at the midaspect of the  biopsied lymph node.  Because of the extensive size of the mass and  surrounding structures, a complete total excisional biopsy was not  performed.  The wounds were then irrigated with saline solution.  There was  no active bleeding.  The wound was closed in multiple layers consisting of 4-  0 Vicryl to reapproximate the platysma muscle sutured in an interrupted  fashion.  The deep subcutaneous tissues were closed with 4-0 Vicryl and  final skin closure ws achieved with 5-0 Ethilon in a running locked fashion.  Bacitracin ointment was applied.  The patient was then awakened from his  anesthetic.  He was extubated and was transferred from the operating room to  the recovery room in stable condition.  There were no complications and the  patient was stable from a cardiovascular standpoint throughout the surgical  procedure.                                                 Caleb Bell, [name]    DLS/MEDQ  D:  04/54/0981  T:  11/12/2001  Job:  [id number]   cc:   Caleb Rough, MD [facility]   Caleb Bell, [name]  [address]  [place]  [place] [address]  Fax: [phone]

## 2010-06-12 ENCOUNTER — Other Ambulatory Visit: Payer: Self-pay | Admitting: Internal Medicine

## 2010-07-20 ENCOUNTER — Ambulatory Visit (INDEPENDENT_AMBULATORY_CARE_PROVIDER_SITE_OTHER): Payer: Self-pay | Admitting: Cardiology

## 2010-07-20 ENCOUNTER — Encounter: Payer: Self-pay | Admitting: Cardiology

## 2010-07-20 DIAGNOSIS — I509 Heart failure, unspecified: Secondary | ICD-10-CM

## 2010-07-20 DIAGNOSIS — I2510 Atherosclerotic heart disease of native coronary artery without angina pectoris: Secondary | ICD-10-CM

## 2010-07-20 NOTE — Assessment & Plan Note (Signed)
Coronary disease is stable. No change in therapy. 

## 2010-07-20 NOTE — Assessment & Plan Note (Signed)
The patient has no clinical symptoms.  It is time to proceed with another two-dimensional echo.  This will help to reassess his LV function again.  We will then make decisions going forward about the possible placement of an ICD if needed.

## 2010-07-20 NOTE — Progress Notes (Signed)
HPI Patient is seen for follow coronary disease and cardiomyopathy.  He is actually feeling very well.  We do want to proceed at some point with an ICD if his left ventricular function remains decreased.  He is on appropriate medications.  His potassium became elevated with Aldactone.  I've chosen not to give him a retrial yet.  He goes about full activities.  He is a Technical sales engineer and active. No Known Allergies  Current Outpatient Prescriptions  Medication Sig Dispense Refill  . aspirin 325 MG tablet Take 325 mg by mouth daily.        . benazepril (LOTENSIN) 20 MG tablet TAKE HALF TABLET BY MOUTH DAILY  30 tablet  2  . carvedilol (COREG) 25 MG tablet TAKE ONE TABLET BY MOUTH TWICE DAILY  60 tablet  5  . furosemide (LASIX) 40 MG tablet TAKE ONE TABLET BY MOUTH ONE TIME DAILY  30 tablet  2  . glimepiride (AMARYL) 4 MG tablet Take 4 mg by mouth daily before breakfast.        . LANOXIN 0.125 MG tablet TAKE ONE TABLET BY MOUTH ONE TIME DAILY  30 each  2  . levothyroxine (SYNTHROID, LEVOTHROID) 88 MCG tablet TAKE 1 TABLET BY MOUTH DAILY. NEEDS OFFICE VISIT.  30 tablet  2  . simvastatin (ZOCOR) 40 MG tablet TAKE  ONE TABLET BY MOUTH NIGHTLY AT BEDTIME  30 tablet  2  . DISCONTD: metFORMIN (GLUCOPHAGE) 850 MG tablet Take 850 mg by mouth 3 (three) times daily.          History   Social History  . Marital Status: Single    Spouse Name: N/A    Number of Children: 0  . Years of Education: N/A   Occupational History  .     Social History Main Topics  . Smoking status: Never Smoker   . Smokeless tobacco: Never Used  . Alcohol Use: No  . Drug Use: No     only in the past  . Sexually Active: Not on file   Other Topics Concern  . Not on file   Social History Narrative   Regular Exercise-- yes (walking daily)     Family History  Problem Relation Age of Onset  . Adopted: Yes    Past Medical History  Diagnosis Date  . Hodgkin's disease      Dx aprox 2004, s/p XRT-Chemo  . CAD (coronary  artery disease)     CABG 2003  . Hyperlipidemia     low HDL  . DM type 2 (diabetes mellitus, type 2)   . Hypothyroidism   . Abuse     Alcohol, cocaine- resoloved for many years   . Atrial fibrillation     Paroxysmal, limited  . IVCD (intraventricular conduction defect)   . CRI (chronic renal insufficiency)      multifactorial... Dr. Allena Katz.. consult September 18, 2009  . Hyperkalemia     August, 2011, Aldactone, Aldactone stopped  . CHF (congestive heart failure)     EF 20-25% echo 5- 2011  . Ejection fraction < 50%     EF 20-25%, echo, May, 2011 / ejection fraction 30-35%, echo, January, 2012  . Hx of radiation therapy     For Hodgkin's disease  . Hx of CABG     2003    Past Surgical History  Procedure Date  . Tonsillectomy   . Coronary artery bypass graft     ROS  Patient denies fever, chills, headache, sweats, rash, change  in vision, change in hearing, chest pain, cough, nausea vomiting, urinary symptoms.  All other systems are reviewed and are negative.  PHYSICAL EXAM Patient is stable.  He is oriented to person time and place.  Head is atraumatic.  Lungs are clear.  Respiratory effort is not labored.  Cardiac exam reveals S1-S2.  No clicks or significant murmurs.  The abdomen is soft.  There is no peripheral edema. Filed Vitals:   07/20/10 0932  BP: 102/52  Pulse: 58  Height: 6\' 1"  (1.854 m)  Weight: 198 lb (89.812 kg)    EKG Is not done today.  ASSESSMENT & PLAN

## 2010-07-20 NOTE — Patient Instructions (Signed)
Your physician has requested that you have an echocardiogram. Echocardiography is a painless test that uses sound waves to create images of your heart. It provides your doctor with information about the size and shape of your heart and how well your heart's chambers and valves are working. This procedure takes approximately one hour. There are no restrictions for this procedure.  Your physician recommends that you schedule a follow-up appointment in: 5 months

## 2010-07-25 ENCOUNTER — Other Ambulatory Visit: Payer: Self-pay | Admitting: Internal Medicine

## 2010-07-26 ENCOUNTER — Other Ambulatory Visit: Payer: Self-pay | Admitting: *Deleted

## 2010-07-26 MED ORDER — GLIMEPIRIDE 2 MG PO TABS: 1.0000 mg | ORAL_TABLET | Freq: Every day | ORAL | Status: DC

## 2010-08-01 ENCOUNTER — Other Ambulatory Visit (HOSPITAL_COMMUNITY): Payer: Self-pay | Admitting: Cardiology

## 2010-08-01 ENCOUNTER — Ambulatory Visit (HOSPITAL_COMMUNITY): Payer: PRIVATE HEALTH INSURANCE | Attending: Cardiology | Admitting: Radiology

## 2010-08-01 DIAGNOSIS — I2510 Atherosclerotic heart disease of native coronary artery without angina pectoris: Secondary | ICD-10-CM | POA: Insufficient documentation

## 2010-08-01 DIAGNOSIS — I059 Rheumatic mitral valve disease, unspecified: Secondary | ICD-10-CM | POA: Insufficient documentation

## 2010-08-01 DIAGNOSIS — I509 Heart failure, unspecified: Secondary | ICD-10-CM | POA: Insufficient documentation

## 2010-08-01 DIAGNOSIS — E785 Hyperlipidemia, unspecified: Secondary | ICD-10-CM | POA: Insufficient documentation

## 2010-08-01 DIAGNOSIS — I079 Rheumatic tricuspid valve disease, unspecified: Secondary | ICD-10-CM | POA: Insufficient documentation

## 2010-08-01 DIAGNOSIS — E119 Type 2 diabetes mellitus without complications: Secondary | ICD-10-CM | POA: Insufficient documentation

## 2010-08-01 MED ORDER — PERFLUTREN PROTEIN A MICROSPH IV SUSP
0.5000 mL | Freq: Once | INTRAVENOUS | Status: AC
  Administered 2010-08-01: 0.5 mL via INTRAVENOUS

## 2010-08-02 ENCOUNTER — Encounter (HOSPITAL_COMMUNITY): Payer: Self-pay | Admitting: Cardiology

## 2010-08-14 ENCOUNTER — Telehealth: Payer: Self-pay | Admitting: *Deleted

## 2010-08-14 ENCOUNTER — Other Ambulatory Visit: Payer: Self-pay | Admitting: Internal Medicine

## 2010-08-14 DIAGNOSIS — I509 Heart failure, unspecified: Secondary | ICD-10-CM

## 2010-08-14 NOTE — Telephone Encounter (Signed)
Called pt to discuss echo results, EF has not improved per Dr [name] needs to see Dr Graciela Husbands in next several weeks to discuss ICD, Left message to call back

## 2010-08-14 NOTE — Telephone Encounter (Signed)
Ok RF a 6 month supply, please be sure meds are correct

## 2010-08-15 NOTE — Telephone Encounter (Signed)
I talked with pt. Pt states Dr [name] had called and discussed echo results with him. He was aware Dr [name] wanted to refer him to Dr Graciela Husbands.

## 2010-08-15 NOTE — Telephone Encounter (Signed)
Returning call back to [facility].

## 2010-08-23 ENCOUNTER — Encounter: Payer: Self-pay | Admitting: Internal Medicine

## 2010-08-28 ENCOUNTER — Ambulatory Visit (INDEPENDENT_AMBULATORY_CARE_PROVIDER_SITE_OTHER): Payer: PRIVATE HEALTH INSURANCE | Admitting: Internal Medicine

## 2010-08-28 ENCOUNTER — Encounter: Payer: Self-pay | Admitting: Internal Medicine

## 2010-08-28 ENCOUNTER — Ambulatory Visit
Admission: RE | Admit: 2010-08-28 | Discharge: 2010-08-28 | Disposition: A | Discharge status: Home / Self Care | Payer: PRIVATE HEALTH INSURANCE | Source: Ambulatory Visit | Attending: Internal Medicine | Admitting: Internal Medicine

## 2010-08-28 ENCOUNTER — Telehealth: Payer: Self-pay

## 2010-08-28 DIAGNOSIS — L0390 Cellulitis, unspecified: Secondary | ICD-10-CM

## 2010-08-28 DIAGNOSIS — Z0000 Encounter for general adult medical examination without abnormal findings: Secondary | ICD-10-CM

## 2010-08-28 DIAGNOSIS — L02419 Cutaneous abscess of limb, unspecified: Secondary | ICD-10-CM

## 2010-08-28 DIAGNOSIS — E119 Type 2 diabetes mellitus without complications: Secondary | ICD-10-CM

## 2010-08-28 DIAGNOSIS — Z23 Encounter for immunization: Secondary | ICD-10-CM

## 2010-08-28 DIAGNOSIS — Z13220 Encounter for screening for lipoid disorders: Secondary | ICD-10-CM

## 2010-08-28 LAB — COMPREHENSIVE METABOLIC PANEL
ALT: 16 U/L (ref 0–53)
AST: 15 U/L (ref 0–37)
Albumin: 4.2 g/dL (ref 3.5–5.2)
Alkaline Phosphatase: 83 U/L (ref 39–117)
BUN: 41 mg/dL — ABNORMAL HIGH (ref 6–23)
CO2: 25 mEq/L (ref 19–32)
Calcium: 9.3 mg/dL (ref 8.4–10.5)
Chloride: 103 mEq/L (ref 96–112)
Creatinine, Ser: 1.8 mg/dL — ABNORMAL HIGH (ref 0.4–1.5)
GFR: 42.57 mL/min — ABNORMAL LOW (ref >60.00)
Glucose, Bld: 123 mg/dL — ABNORMAL HIGH (ref 70–99)
Potassium: 5.2 mEq/L — ABNORMAL HIGH (ref 3.5–5.1)
Sodium: 139 mEq/L (ref 135–145)
Total Bilirubin: 0.6 mg/dL (ref 0.3–1.2)
Total Protein: 7.3 g/dL (ref 6.0–8.3)

## 2010-08-28 LAB — CBC WITH DIFFERENTIAL/PLATELET
Basophils Absolute: 0 10*3/uL (ref 0.0–0.1)
Basophils Relative: 0.4 % (ref 0.0–3.0)
Eosinophils Absolute: 0.5 10*3/uL (ref 0.0–0.7)
Eosinophils Relative: 5.5 % — ABNORMAL HIGH (ref 0.0–5.0)
HCT: 37.1 % — ABNORMAL LOW (ref 39.0–52.0)
Hemoglobin: 12.5 g/dL — ABNORMAL LOW (ref 13.0–17.0)
Lymphocytes Relative: 16.6 % (ref 12.0–46.0)
Lymphs Abs: 1.6 10*3/uL (ref 0.7–4.0)
MCHC: 33.6 g/dL (ref 30.0–36.0)
MCV: 82.6 fl (ref 78.0–100.0)
Monocytes Absolute: 0.6 10*3/uL (ref 0.1–1.0)
Monocytes Relative: 6.8 % (ref 3.0–12.0)
Neutro Abs: 6.7 10*3/uL (ref 1.4–7.7)
Neutrophils Relative %: 70.7 % (ref 43.0–77.0)
Platelets: 190 10*3/uL (ref 150.0–400.0)
RBC: 4.5 Mil/uL (ref 4.22–5.81)
RDW: 14.7 % — ABNORMAL HIGH (ref 11.5–14.6)
WBC: 9.5 10*3/uL (ref 4.5–10.5)

## 2010-08-28 LAB — LIPID PANEL
Cholesterol: 179 mg/dL (ref 0–200)
HDL: 37.6 mg/dL — ABNORMAL LOW (ref >39.00)
Total CHOL/HDL Ratio: 5
Triglycerides: 256 mg/dL — ABNORMAL HIGH (ref 0.0–149.0)
VLDL: 51.2 mg/dL — ABNORMAL HIGH (ref 0.0–40.0)

## 2010-08-28 LAB — TSH: TSH: 1.53 u[IU]/mL (ref 0.35–5.50)

## 2010-08-28 LAB — PSA: PSA: 0.84 ng/mL (ref 0.10–4.00)

## 2010-08-28 LAB — HEMOGLOBIN A1C: Hgb A1c MFr Bld: 7.1 % — ABNORMAL HIGH (ref 4.6–6.5)

## 2010-08-28 LAB — LDL CHOLESTEROL, DIRECT: Direct LDL: 99.7 mg/dL

## 2010-08-28 MED ORDER — DOXYCYCLINE HYCLATE 100 MG PO TABS: 100.0000 mg | ORAL_TABLET | Freq: Two times a day (BID) | ORAL | Status: AC

## 2010-08-28 NOTE — Assessment & Plan Note (Addendum)
I'm concerned about the foot cellulitis, pt was unaware of the shallow ulcer in the foot. Plan: Td, XR, Doxycycline, U/s r/o DVT, wound care center referal ER if worse , reasses in 2 days, if not improving: admission . See instructions

## 2010-08-28 NOTE — Telephone Encounter (Signed)
XR and u/s ok, plan is the same

## 2010-08-28 NOTE — Assessment & Plan Note (Addendum)
Td 03 and today Pneumonia shot: needs one, will provide on next CPX Labs  Diet exercise discussed Cscope Vs IFOB discussed, currently w/ cellulitis and planning to get  a defibrilator ----> iFOB for now

## 2010-08-28 NOTE — Progress Notes (Signed)
Subjective:    Patient ID: Caleb Bell, male    DOB: May 26, 1955, 55 y.o.   MRN: [id number]  HPI Complete physical exam Doing well, went to the beach this weekend, walked barefoot at the sand. Since then he is having some pain at the right lower extremity, mostly on the pretibial area. See physical exam  Past Medical History  Diagnosis Date  . Hodgkin's disease      Dx aprox 2004, s/p XRT-Chemo  . CAD (coronary artery disease)     CABG 2003  . Hyperlipidemia     low HDL  . DM type 2 (diabetes mellitus, type 2)   . Hypothyroidism   . Abuse     Alcohol, cocaine- resoloved for many years   . Atrial fibrillation     Paroxysmal, limited  . IVCD (intraventricular conduction defect)   . CRI (chronic renal insufficiency)      multifactorial... Dr. Allena Katz.. consult September 18, 2009  . Hyperkalemia     August, 2011, Aldactone, Aldactone stopped  . CHF (congestive heart failure)     EF 20-25% echo 5- 2011  . Ejection fraction < 50%     EF 20-25%, echo, May, 2011 / ejection fraction 30-35%, echo, January, 2012  . Hx of radiation therapy     For Hodgkin's disease  . Hx of CABG     2003   Past Surgical History  Procedure Date  . Tonsillectomy   . Coronary artery bypass graft    History   Social History  . Marital Status: Single    Spouse Name: N/A    Number of Children: 0  . Years of Education: N/A   Occupational History  . musician    Social History Main Topics  . Smoking status: Never Smoker   . Smokeless tobacco: Never Used  . Alcohol Use: No     former smoker   . Drug Use: No     only in the past  . Sexually Active: Not on file   Other Topics Concern  . Not on file   Social History Narrative   Regular Exercise-- yes (walking daily) --- diet: needs improvement     Family History  Problem Relation Age of Onset  . Adopted: Yes     Review of Systems  Constitutional: Positive for unexpected weight change ( wt loss d/t better diet). Negative for fever and  fatigue.  Respiratory: Negative for cough, shortness of breath and wheezing.   Cardiovascular: Negative for chest pain and leg swelling.  Gastrointestinal: Negative for nausea, abdominal pain, diarrhea and blood in stool.  Genitourinary: Negative for hematuria and difficulty urinating.  Psychiatric/Behavioral:       No anxiety and depression       Objective:   Physical Exam  Constitutional: He is oriented to person, place, and time. He appears well-developed and well-nourished. No distress.  HENT:  Head: Normocephalic.  Cardiovascular: Normal rate and regular rhythm.   No murmur heard.      Good pedal pulses bilaterally  Pulmonary/Chest: Effort normal and breath sounds normal.  Abdominal: Soft. Bowel sounds are normal. He exhibits no distension. There is no tenderness. There is no guarding.  Genitourinary:       deferred DRE  Musculoskeletal: He exhibits no edema.       Legs:      Feet:       Right calf around 1 cm larger than left, not tender to palpation. He is a slightly tender at both  sides of the pretibial area . Pinprick examination of the feet showed decreased sensitivity  Neurological: He is alert and oriented to person, place, and time.  Skin: He is not diaphoretic.          Assessment & Plan:

## 2010-08-28 NOTE — Patient Instructions (Addendum)
Antibiotics for 10 days, avoid excessive sun exposure Foot elevation Soak foot on salted water  Wet to dry gauze to foot XR and ultrasound  ER if Fever, increase redness, swelling Came back in two day (overbook ok)

## 2010-08-28 NOTE — Telephone Encounter (Signed)
Pt aware of results 

## 2010-08-28 NOTE — Telephone Encounter (Signed)
[facility] called to inform Dr.Paz reports are available (foot xray and Venous) patient had already left BUT really anxious for results   Dr.Paz please further advise and forward to [facility]

## 2010-08-29 ENCOUNTER — Telehealth: Payer: Self-pay | Admitting: Cardiology

## 2010-08-29 DIAGNOSIS — I509 Heart failure, unspecified: Secondary | ICD-10-CM

## 2010-08-29 NOTE — Telephone Encounter (Signed)
Per pt call, pt thought he had appt on August 28th. I informed pt we did not have an appt scheduled for him for that date. Pt said he got the impression that Dr. [name] wanted to talk to pt about Dr. Graciela Husbands putting in an implant. Please return pt call to advise/discuss.

## 2010-08-29 NOTE — Telephone Encounter (Signed)
I talked with pt. Dr [name] had referred pt to Dr Graciela Husbands 08/14/10 for evaluation for ICD placement, EF 25% on echo done 08/01/10. Pt is aware that I will forward this message to schedulers to schedule appt for him with Dr Graciela Husbands. Pt prefers morning appt with Dr Graciela Husbands.

## 2010-08-30 ENCOUNTER — Encounter: Payer: Self-pay | Admitting: Internal Medicine

## 2010-08-30 ENCOUNTER — Telehealth: Payer: Self-pay | Admitting: Internal Medicine

## 2010-08-30 ENCOUNTER — Ambulatory Visit (INDEPENDENT_AMBULATORY_CARE_PROVIDER_SITE_OTHER): Payer: PRIVATE HEALTH INSURANCE | Admitting: Internal Medicine

## 2010-08-30 DIAGNOSIS — E875 Hyperkalemia: Secondary | ICD-10-CM

## 2010-08-30 DIAGNOSIS — L02619 Cutaneous abscess of unspecified foot: Secondary | ICD-10-CM

## 2010-08-30 DIAGNOSIS — E119 Type 2 diabetes mellitus without complications: Secondary | ICD-10-CM

## 2010-08-30 DIAGNOSIS — L0291 Cutaneous abscess, unspecified: Secondary | ICD-10-CM

## 2010-08-30 DIAGNOSIS — N259 Disorder resulting from impaired renal tubular function, unspecified: Secondary | ICD-10-CM

## 2010-08-30 DIAGNOSIS — L0390 Cellulitis, unspecified: Secondary | ICD-10-CM

## 2010-08-30 DIAGNOSIS — E785 Hyperlipidemia, unspecified: Secondary | ICD-10-CM

## 2010-08-30 MED ORDER — HYDROCODONE-ACETAMINOPHEN 5-500 MG PO TABS: 1.0000 | ORAL_TABLET | ORAL | Status: DC | PRN

## 2010-08-30 MED ORDER — GLIMEPIRIDE 4 MG PO TABS: ORAL_TABLET | ORAL | Status: DC

## 2010-08-30 NOTE — Assessment & Plan Note (Signed)
Increase zocor to 1.5 tablets a day

## 2010-08-30 NOTE — Assessment & Plan Note (Signed)
Not as well-controlled as before, he is actually taking Amaryl 4 mg daily, will increase to 6 mg daily.

## 2010-08-30 NOTE — Assessment & Plan Note (Signed)
Low K diet emphazise

## 2010-08-30 NOTE — Telephone Encounter (Signed)
Please arrange for a wound care center referral either in [place] or [place]. Send my last 2 office visit notes

## 2010-08-30 NOTE — Patient Instructions (Signed)
Finish the antibiotics Call if swelling or redness increase

## 2010-08-30 NOTE — Assessment & Plan Note (Signed)
Kidney function is stable, potassium is slightly elevated, recommend a low potassium diet

## 2010-08-30 NOTE — Assessment & Plan Note (Signed)
Improving, wound care center referal pending

## 2010-08-30 NOTE — Telephone Encounter (Signed)
done

## 2010-08-30 NOTE — Progress Notes (Signed)
  Subjective:    Patient ID: Caleb Bell, male    DOB: 11-28-1955, 55 y.o.   MRN: [id number]  HPI Followup from the most recent visit, he was found to have cellulitis in the  right foot  Past Medical History  Diagnosis Date  . Hodgkin's disease      Dx aprox 2004, s/p XRT-Chemo  . CAD (coronary artery disease)     CABG 2003  . Hyperlipidemia     low HDL  . DM type 2 (diabetes mellitus, type 2)   . Hypothyroidism   . Abuse     Alcohol, cocaine- resoloved for many years   . Atrial fibrillation     Paroxysmal, limited  . IVCD (intraventricular conduction defect)   . CRI (chronic renal insufficiency)      multifactorial... Dr. Allena Katz.. consult September 18, 2009  . Hyperkalemia     August, 2011, Aldactone, Aldactone stopped  . CHF (congestive heart failure)     EF 20-25% echo 5- 2011  . Ejection fraction < 50%     EF 20-25%, echo, May, 2011 / ejection fraction 30-35%, echo, January, 2012  . Hx of radiation therapy     For Hodgkin's disease  . Hx of CABG     2003   Past Surgical History  Procedure Date  . Tonsillectomy   . Coronary artery bypass graft     Review of Systems  good compliance with antibiotics. Denies any fever, swelling has decreased a little. Blood sugars have noted to be slightly worse than before despite good compliance with medication.    Objective:   Physical Exam  Alert oriented, in no apparent distress. Right lower extremity: Plantar ulcer looks much cleaner, no discharge, no ador,  It is deeper than  what I thought. Right foot swelling slightly decreased, redness has also decreased      Assessment & Plan:

## 2010-09-06 ENCOUNTER — Encounter (HOSPITAL_BASED_OUTPATIENT_CLINIC_OR_DEPARTMENT_OTHER): Payer: PRIVATE HEALTH INSURANCE | Attending: Internal Medicine

## 2010-09-06 DIAGNOSIS — I4891 Unspecified atrial fibrillation: Secondary | ICD-10-CM | POA: Insufficient documentation

## 2010-09-06 DIAGNOSIS — E1169 Type 2 diabetes mellitus with other specified complication: Secondary | ICD-10-CM | POA: Insufficient documentation

## 2010-09-06 DIAGNOSIS — L97509 Non-pressure chronic ulcer of other part of unspecified foot with unspecified severity: Secondary | ICD-10-CM | POA: Insufficient documentation

## 2010-09-06 DIAGNOSIS — Z8571 Personal history of Hodgkin lymphoma: Secondary | ICD-10-CM | POA: Insufficient documentation

## 2010-09-06 DIAGNOSIS — L84 Corns and callosities: Secondary | ICD-10-CM | POA: Insufficient documentation

## 2010-09-06 DIAGNOSIS — Z951 Presence of aortocoronary bypass graft: Secondary | ICD-10-CM | POA: Insufficient documentation

## 2010-09-06 DIAGNOSIS — I2510 Atherosclerotic heart disease of native coronary artery without angina pectoris: Secondary | ICD-10-CM | POA: Insufficient documentation

## 2010-09-10 ENCOUNTER — Encounter: Payer: Self-pay | Admitting: Internal Medicine

## 2010-09-10 ENCOUNTER — Ambulatory Visit (INDEPENDENT_AMBULATORY_CARE_PROVIDER_SITE_OTHER): Payer: PRIVATE HEALTH INSURANCE | Admitting: Internal Medicine

## 2010-09-10 VITALS — BP 88/62 | HR 64 | Resp 16 | Ht 73.0 in | Wt 200.0 lb

## 2010-09-10 DIAGNOSIS — I2510 Atherosclerotic heart disease of native coronary artery without angina pectoris: Secondary | ICD-10-CM

## 2010-09-10 DIAGNOSIS — R0989 Other specified symptoms and signs involving the circulatory and respiratory systems: Secondary | ICD-10-CM

## 2010-09-12 ENCOUNTER — Telehealth: Payer: Self-pay | Admitting: *Deleted

## 2010-09-12 ENCOUNTER — Ambulatory Visit: Payer: PRIVATE HEALTH INSURANCE | Admitting: Internal Medicine

## 2010-09-12 ENCOUNTER — Encounter: Payer: Self-pay | Admitting: *Deleted

## 2010-09-12 NOTE — Telephone Encounter (Signed)
Same Day Abstraction. COMPLETED [Cardiology 'abstracted 08/02/10]

## 2010-09-14 ENCOUNTER — Other Ambulatory Visit: Payer: Self-pay | Admitting: Internal Medicine

## 2010-09-14 NOTE — Telephone Encounter (Signed)
Rx Done . 

## 2010-09-16 NOTE — Progress Notes (Signed)
   Patient ID: Caleb Bell, male    DOB: [birth year], 55 y.o.   MRN: [id number]  HPI    Review of Systems    Physical Exam

## 2010-09-18 ENCOUNTER — Institutional Professional Consult (permissible substitution): Payer: PRIVATE HEALTH INSURANCE | Admitting: Internal Medicine

## 2010-09-25 ENCOUNTER — Encounter (HOSPITAL_BASED_OUTPATIENT_CLINIC_OR_DEPARTMENT_OTHER): Payer: PRIVATE HEALTH INSURANCE | Attending: Internal Medicine

## 2010-09-25 DIAGNOSIS — L97509 Non-pressure chronic ulcer of other part of unspecified foot with unspecified severity: Secondary | ICD-10-CM | POA: Insufficient documentation

## 2010-09-25 DIAGNOSIS — Z951 Presence of aortocoronary bypass graft: Secondary | ICD-10-CM | POA: Insufficient documentation

## 2010-09-25 DIAGNOSIS — I2510 Atherosclerotic heart disease of native coronary artery without angina pectoris: Secondary | ICD-10-CM | POA: Insufficient documentation

## 2010-09-25 DIAGNOSIS — Z8571 Personal history of Hodgkin lymphoma: Secondary | ICD-10-CM | POA: Insufficient documentation

## 2010-09-25 DIAGNOSIS — L84 Corns and callosities: Secondary | ICD-10-CM | POA: Insufficient documentation

## 2010-09-25 DIAGNOSIS — E1169 Type 2 diabetes mellitus with other specified complication: Secondary | ICD-10-CM | POA: Insufficient documentation

## 2010-09-25 DIAGNOSIS — I4891 Unspecified atrial fibrillation: Secondary | ICD-10-CM | POA: Insufficient documentation

## 2010-10-08 ENCOUNTER — Ambulatory Visit (HOSPITAL_COMMUNITY)
Admission: RE | Admit: 2010-10-08 | Discharge: 2010-10-08 | Disposition: A | Discharge status: Home / Self Care | Payer: PRIVATE HEALTH INSURANCE | Source: Ambulatory Visit | Attending: Internal Medicine | Admitting: Internal Medicine

## 2010-10-08 ENCOUNTER — Other Ambulatory Visit (HOSPITAL_BASED_OUTPATIENT_CLINIC_OR_DEPARTMENT_OTHER): Payer: Self-pay | Admitting: Internal Medicine

## 2010-10-08 DIAGNOSIS — M7989 Other specified soft tissue disorders: Secondary | ICD-10-CM | POA: Insufficient documentation

## 2010-10-08 DIAGNOSIS — M869 Osteomyelitis, unspecified: Secondary | ICD-10-CM

## 2010-10-08 DIAGNOSIS — E119 Type 2 diabetes mellitus without complications: Secondary | ICD-10-CM | POA: Insufficient documentation

## 2010-10-08 DIAGNOSIS — M79609 Pain in unspecified limb: Secondary | ICD-10-CM | POA: Insufficient documentation

## 2010-10-16 NOTE — Progress Notes (Signed)
Left without being seen.

## 2010-10-22 ENCOUNTER — Encounter (HOSPITAL_BASED_OUTPATIENT_CLINIC_OR_DEPARTMENT_OTHER): Payer: PRIVATE HEALTH INSURANCE | Attending: Internal Medicine

## 2010-10-22 DIAGNOSIS — E1169 Type 2 diabetes mellitus with other specified complication: Secondary | ICD-10-CM | POA: Insufficient documentation

## 2010-10-22 DIAGNOSIS — C8190 Hodgkin lymphoma, unspecified, unspecified site: Secondary | ICD-10-CM | POA: Insufficient documentation

## 2010-10-22 DIAGNOSIS — E039 Hypothyroidism, unspecified: Secondary | ICD-10-CM | POA: Insufficient documentation

## 2010-10-22 DIAGNOSIS — I4891 Unspecified atrial fibrillation: Secondary | ICD-10-CM | POA: Insufficient documentation

## 2010-10-22 DIAGNOSIS — Z951 Presence of aortocoronary bypass graft: Secondary | ICD-10-CM | POA: Insufficient documentation

## 2010-10-22 DIAGNOSIS — I2510 Atherosclerotic heart disease of native coronary artery without angina pectoris: Secondary | ICD-10-CM | POA: Insufficient documentation

## 2010-10-22 DIAGNOSIS — Z79899 Other long term (current) drug therapy: Secondary | ICD-10-CM | POA: Insufficient documentation

## 2010-10-22 DIAGNOSIS — L97509 Non-pressure chronic ulcer of other part of unspecified foot with unspecified severity: Secondary | ICD-10-CM | POA: Insufficient documentation

## 2010-10-23 ENCOUNTER — Ambulatory Visit (INDEPENDENT_AMBULATORY_CARE_PROVIDER_SITE_OTHER): Payer: PRIVATE HEALTH INSURANCE | Admitting: Internal Medicine

## 2010-10-23 ENCOUNTER — Encounter: Payer: Self-pay | Admitting: Internal Medicine

## 2010-10-23 DIAGNOSIS — I509 Heart failure, unspecified: Secondary | ICD-10-CM

## 2010-10-23 DIAGNOSIS — I2589 Other forms of chronic ischemic heart disease: Secondary | ICD-10-CM

## 2010-10-23 DIAGNOSIS — I4891 Unspecified atrial fibrillation: Secondary | ICD-10-CM

## 2010-10-23 NOTE — Progress Notes (Signed)
HPI: Caleb Bell is a 55 y.o. male Is seen at the request of Dr. Melvenia Needles for consideration of ICD implantation. He is status post bypass surgery in 2003 and had a long-standing ejection fraction of 40% range. Over recent years and is fallen into the 25% range and has persisted despite medical therapy. He's had no chest pain. He had no significant shortness of breath. I should note that he presented with his need for CABG with severe shortness of breath and no chest pain  He has not had reevaluation of his coronary artery disease since bypass He had no palpitations and no syncope.  His exercise tolerance is moderately good.  He has diabetes. He is currently undergoing ongoing  Wound therapy for his foot. Current Outpatient Prescriptions  Medication Sig Dispense Refill  . aspirin 325 MG tablet Take 325 mg by mouth daily.        . benazepril (LOTENSIN) 20 MG tablet Take 1/2 tablet by mouth once daily.      . carvedilol (COREG) 25 MG tablet TAKE ONE TABLET BY MOUTH TWICE DAILY  60 tablet  5  . furosemide (LASIX) 40 MG tablet TAKE ONE TABLET BY MOUTH ONE TIME DAILY  30 tablet  5  . glimepiride (AMARYL) 4 MG tablet Take 4 mg by mouth daily before breakfast.        . HYDROcodone-acetaminophen (VICODIN) 5-500 MG per tablet Take 1 tablet by mouth every 4 (four) hours as needed for pain.  40 tablet  0  . LANOXIN 0.125 MG tablet TAKE ONE TABLET BY MOUTH ONE TIME DAILY  30 each  2  . levothyroxine (SYNTHROID, LEVOTHROID) 88 MCG tablet TAKE 1 TABLET BY MOUTH DAILY. NEEDS OFFICE VISIT.  30 tablet  5  . simvastatin (ZOCOR) 40 MG tablet Take 40 mg by mouth at bedtime.       . metFORMIN (GLUCOPHAGE) 850 MG tablet Take 850 mg by mouth 3 (three) times daily with meals.          No Known Allergies  Past Medical History  Diagnosis Date  . Hodgkin's disease      Dx aprox 2004, s/p XRT-Chemo  . CAD (coronary artery disease)     CABG 2003  . Hyperlipidemia     low HDL  . DM type 2 (diabetes  mellitus, type 2)   . Hypothyroidism   . Abuse     Alcohol, cocaine- resoloved for many years   . Atrial fibrillation     Paroxysmal, limited  . IVCD (intraventricular conduction defect)   . CRI (chronic renal insufficiency)      multifactorial... Dr. Allena Katz.. consult September 18, 2009  . Hyperkalemia     August, 2011, Aldactone, Aldactone stopped  . CHF (congestive heart failure)     EF 20-25% echo 5- 2011  . Ejection fraction < 50%     EF 20-25%, echo, May, 2011 / ejection fraction 30-35%, echo, January, 2012  . Hx of radiation therapy     For Hodgkin's disease  . Hx of CABG     2003  . EFE (endocardial fibroelastosis)   . ETOH abuse   . Cocaine abuse   . Elevated serum creatinine     Past Surgical History  Procedure Date  . Tonsillectomy   . Coronary artery bypass graft   . Cystectomy     Upper Back    Family History  Problem Relation Age of Onset  . Adopted: Yes    History  Social History  . Marital Status: Single    Spouse Name: N/A    Number of Children: 0  . Years of Education: N/A   Occupational History  . musician    Social History Main Topics  . Smoking status: Never Smoker   . Smokeless tobacco: Never Used  . Alcohol Use: No     Hx of abuse/clean since 2003   . Drug Use: No     Hx of abuse/clean for many years  . Sexually Active: Not on file   Other Topics Concern  . Not on file   Social History Narrative   Regular Exercise-- yes (walking daily) --- diet: needs improvement MusicianSingle, no childrenPatient Adopted--no family Hx    Fourteen point review of systems was negative except as noted in HPI and PMH   PHYSICAL EXAMINATION  Blood pressure 122/66, pulse 60, height 6\' 1"  (1.854 m), weight 203 lb (92.08 kg).   Well developed and nourished in no acute distress HENT normal Neck supple with JVP-flat Carotids brisk and full without bruits Back without scoliosis or kyphosis Clear Regular rate and rhythm, a 2/6 murmur is present at  the apex patomegaly or midline pulsation Femoral pulses 2+ distal pulses intact No Clubbing cyanosis edema Skin-warm and dry LN-neg submandibular and supraclavicular A & Oriented CN 3-12 normal  Grossly normal sensory and motor function He is walking around in a boot on his right foot is wrapped. Affect engaging .   Electrocardiogram demonstrates sinus rhythm at 64 Intervals 0.18/0.15/0.41 Axis is -41

## 2010-10-23 NOTE — Assessment & Plan Note (Signed)
He has minimal congestive symptoms. He is a nonspecific IVCD and as such I would not be inclined to insert a left ventricular lead device implantation

## 2010-10-23 NOTE — Assessment & Plan Note (Addendum)
The patient has ischemic cardiopathy with persistent depression of left ventricular systolic function. Ejection fractions over the last year have been in the 25% range despite optimal pharmacological therapy. This is notwithstanding the fact that it was about 40% or so couple years ago. He is now 9 years post CABG. I wonder whether there has been progression of obstructive disease which is contributing to deteriorating left ventricular function. He had no pain at the time of initial presentation. I would think in terms of catheterization to clarify the status of his vascular bed prior to ICD implantation.  In addition prior to implantation, we would need to make sure that his wound in his foot is recovered without ongoing infection

## 2010-10-23 NOTE — Assessment & Plan Note (Signed)
The patient has a history of atrial fibrillation. In the event that this is true, his CHADS VASC score is 3 and it would be appropriate that he be treated not with aspirin both oral anticoagulation with either Coumadin or one of its cousins

## 2010-10-24 ENCOUNTER — Encounter: Payer: Self-pay | Admitting: Internal Medicine

## 2010-11-20 ENCOUNTER — Telehealth: Payer: Self-pay | Admitting: Internal Medicine

## 2010-11-20 NOTE — Telephone Encounter (Signed)
Pt was to be scheduled for  cath or defib, according to dr Graciela Husbands during ov 10-1, pt hasn't heard anything and would like a call back

## 2010-11-20 NOTE — Telephone Encounter (Signed)
I will forward to Dr. Graciela Husbands. He was going to talk to Dr. [name] about a possible cath and why he is on digoxin.

## 2010-11-21 ENCOUNTER — Other Ambulatory Visit: Payer: Self-pay | Admitting: Cardiology

## 2010-11-21 ENCOUNTER — Other Ambulatory Visit: Payer: Self-pay | Admitting: Internal Medicine

## 2010-11-26 ENCOUNTER — Encounter (HOSPITAL_BASED_OUTPATIENT_CLINIC_OR_DEPARTMENT_OTHER): Payer: PRIVATE HEALTH INSURANCE

## 2010-11-26 ENCOUNTER — Encounter: Payer: Self-pay | Admitting: Cardiology

## 2010-11-26 DIAGNOSIS — I2510 Atherosclerotic heart disease of native coronary artery without angina pectoris: Secondary | ICD-10-CM | POA: Insufficient documentation

## 2010-11-26 NOTE — Telephone Encounter (Signed)
Per Dr. Graciela Husbands, Dr. [name] was going to handle setting the patient up for cath. I will forward to Hubert Azure, nurse for Dr. [name].

## 2010-11-26 NOTE — Progress Notes (Signed)
Addended by: [name] D on: 11/26/2010 05:34 PM   Modules accepted: Orders

## 2010-11-26 NOTE — Progress Notes (Signed)
The patient was seen by Dr.Klein in October, 2012.  At that time it was decided that it would be best to proceed with cardiac catheterization to reassess his anatomy or proceeding with ICD placement.  If he were to have significant hibernating myocardium such that revascularization would lead to improved LV function this would be the preferred approach.  If not he will definitely be a candidate for ICD placement.  I spoke with the patient about this and we will arrange for outpatient cardiac catheterization on Friday, November 16.  The patient is a Technical sales engineer.  He will discuss with the number of our team who does the catheterization whether it should be done from the femoral floor radial approach.

## 2010-11-27 ENCOUNTER — Other Ambulatory Visit: Payer: Self-pay

## 2010-11-27 DIAGNOSIS — I509 Heart failure, unspecified: Secondary | ICD-10-CM

## 2010-11-27 DIAGNOSIS — I2510 Atherosclerotic heart disease of native coronary artery without angina pectoris: Secondary | ICD-10-CM

## 2010-11-27 DIAGNOSIS — I255 Ischemic cardiomyopathy: Secondary | ICD-10-CM

## 2010-11-27 DIAGNOSIS — I4891 Unspecified atrial fibrillation: Secondary | ICD-10-CM

## 2010-11-30 ENCOUNTER — Other Ambulatory Visit: Payer: Self-pay | Admitting: Internal Medicine

## 2010-11-30 NOTE — Telephone Encounter (Signed)
Last ov 08/30/10 last refill 08/14/10 Refill done

## 2010-12-03 ENCOUNTER — Other Ambulatory Visit (INDEPENDENT_AMBULATORY_CARE_PROVIDER_SITE_OTHER): Payer: PRIVATE HEALTH INSURANCE | Admitting: *Deleted

## 2010-12-03 DIAGNOSIS — I2589 Other forms of chronic ischemic heart disease: Secondary | ICD-10-CM

## 2010-12-03 DIAGNOSIS — I2510 Atherosclerotic heart disease of native coronary artery without angina pectoris: Secondary | ICD-10-CM

## 2010-12-03 DIAGNOSIS — I4891 Unspecified atrial fibrillation: Secondary | ICD-10-CM

## 2010-12-03 DIAGNOSIS — I255 Ischemic cardiomyopathy: Secondary | ICD-10-CM

## 2010-12-03 DIAGNOSIS — I509 Heart failure, unspecified: Secondary | ICD-10-CM

## 2010-12-04 LAB — CBC WITH DIFFERENTIAL/PLATELET
Basophils Absolute: 0 10*3/uL (ref 0.0–0.1)
Basophils Relative: 0.4 % (ref 0.0–3.0)
Eosinophils Absolute: 0.3 10*3/uL (ref 0.0–0.7)
Eosinophils Relative: 4.2 % (ref 0.0–5.0)
HCT: 33.6 % — ABNORMAL LOW (ref 39.0–52.0)
Hemoglobin: 11.3 g/dL — ABNORMAL LOW (ref 13.0–17.0)
Lymphocytes Relative: 25.2 % (ref 12.0–46.0)
Lymphs Abs: 1.8 10*3/uL (ref 0.7–4.0)
MCHC: 33.6 g/dL (ref 30.0–36.0)
MCV: 80.6 fl (ref 78.0–100.0)
Monocytes Absolute: 0.4 10*3/uL (ref 0.1–1.0)
Monocytes Relative: 5 % (ref 3.0–12.0)
Neutro Abs: 4.8 10*3/uL (ref 1.4–7.7)
Neutrophils Relative %: 65.2 % (ref 43.0–77.0)
Platelets: 253 10*3/uL (ref 150.0–400.0)
RBC: 4.17 Mil/uL — ABNORMAL LOW (ref 4.22–5.81)
RDW: 15.9 % — ABNORMAL HIGH (ref 11.5–14.6)
WBC: 7.3 10*3/uL (ref 4.5–10.5)

## 2010-12-04 LAB — BASIC METABOLIC PANEL
BUN: 34 mg/dL — ABNORMAL HIGH (ref 6–23)
CO2: 22 mEq/L (ref 19–32)
Calcium: 9 mg/dL (ref 8.4–10.5)
Chloride: 106 mEq/L (ref 96–112)
Creatinine, Ser: 1.6 mg/dL — ABNORMAL HIGH (ref 0.4–1.5)
GFR: 49.16 mL/min — ABNORMAL LOW (ref >60.00)
Glucose, Bld: 195 mg/dL — ABNORMAL HIGH (ref 70–99)
Potassium: 4.7 mEq/L (ref 3.5–5.1)
Sodium: 137 mEq/L (ref 135–145)

## 2010-12-04 LAB — PROTIME-INR
INR: 1 ratio (ref 0.8–1.0)
Prothrombin Time: 11.1 s (ref 10.2–12.4)

## 2010-12-04 LAB — APTT: aPTT: 28 s (ref 21.7–28.8)

## 2010-12-06 ENCOUNTER — Telehealth: Payer: Self-pay | Admitting: Cardiology

## 2010-12-06 ENCOUNTER — Encounter: Payer: Self-pay | Admitting: Cardiology

## 2010-12-06 NOTE — Telephone Encounter (Signed)
Confirmed pt is to go to [facility].

## 2010-12-06 NOTE — Progress Notes (Signed)
Patient is scheduled for cardiac catheterization tomorrow.  His most recent creatinine was 1.6.  This is actually within a range that we have seen from 1.5 up to 1.8.  I have instructed him to be sure he does not take his Lasix tomorrow, the morning of the cath.  In addition he will drink extra 8 ounce glasses of liquid this evening.  I will also be talking to our doctor doing the catheterization.  There will be no reason to do an LV gram.  He can have coronaries only.

## 2010-12-06 NOTE — Telephone Encounter (Signed)
Pt calling re Cath tomorrow , pt didn;t write info down and has questions

## 2010-12-07 ENCOUNTER — Encounter (HOSPITAL_BASED_OUTPATIENT_CLINIC_OR_DEPARTMENT_OTHER)
Admission: RE | Disposition: A | Discharge status: Home / Self Care | Payer: Self-pay | Source: Ambulatory Visit | Attending: Cardiology

## 2010-12-07 ENCOUNTER — Inpatient Hospital Stay (HOSPITAL_BASED_OUTPATIENT_CLINIC_OR_DEPARTMENT_OTHER)
Admission: RE | Admit: 2010-12-07 | Discharge: 2010-12-07 | Disposition: A | Discharge status: Home / Self Care | Payer: PRIVATE HEALTH INSURANCE | Source: Ambulatory Visit | Attending: Cardiology | Admitting: Cardiology

## 2010-12-07 ENCOUNTER — Encounter (HOSPITAL_BASED_OUTPATIENT_CLINIC_OR_DEPARTMENT_OTHER): Payer: Self-pay | Admitting: Cardiology

## 2010-12-07 DIAGNOSIS — I2510 Atherosclerotic heart disease of native coronary artery without angina pectoris: Secondary | ICD-10-CM | POA: Insufficient documentation

## 2010-12-07 DIAGNOSIS — I2589 Other forms of chronic ischemic heart disease: Secondary | ICD-10-CM | POA: Insufficient documentation

## 2010-12-07 LAB — POCT I-STAT GLUCOSE
Glucose, Bld: 181 mg/dL — ABNORMAL HIGH (ref 70–99)
Operator id: 141321

## 2010-12-07 SURGERY — JV LEFT HEART CATHETERIZATION WITH CORONARY/GRAFT ANGIOGRAM
Anesthesia: Moderate Sedation

## 2010-12-07 MED ORDER — DIAZEPAM 5 MG PO TABS
5.0000 mg | ORAL_TABLET | ORAL | Status: AC
  Administered 2010-12-07: 5 mg via ORAL

## 2010-12-07 MED ORDER — SODIUM CHLORIDE 0.9 % IV SOLN: INTRAVENOUS | Status: DC

## 2010-12-07 MED ORDER — ONDANSETRON HCL 4 MG/2ML IJ SOLN: 4.0000 mg | Freq: Four times a day (QID) | INTRAMUSCULAR | Status: DC | PRN

## 2010-12-07 MED ORDER — SODIUM CHLORIDE 0.9 % IV SOLN
250.0000 mL | INTRAVENOUS | Status: DC
  Administered 2010-12-07: 250 mL via INTRAVENOUS

## 2010-12-07 MED ORDER — ACETAMINOPHEN 325 MG PO TABS: 650.0000 mg | ORAL_TABLET | ORAL | Status: DC | PRN

## 2010-12-07 NOTE — H&P (View-Only) (Signed)
Patient is scheduled for cardiac catheterization tomorrow.  His most recent creatinine was 1.6.  This is actually within a range that we have seen from 1.5 up to 1.8.  I have instructed him to be sure he does not take his Lasix tomorrow, the morning of the cath.  In addition he will drink extra 8 ounce glasses of liquid this evening.  I will also be talking to our doctor doing the catheterization.  There will be no reason to do an LV gram.  He can have coronaries only. 

## 2010-12-07 NOTE — Interval H&P Note (Signed)
History and Physical Interval Note:   12/07/2010   10:19 AM   Caleb Bell  has presented today for surgery, with the diagnosis of Chest pain   The various methods of treatment have been discussed with the patient and family. After consideration of risks, benefits and other options for treatment, the patient has consented to  Procedure(s): JV LEFT HEART CATHETERIZATION WITH CORONARY/GRAFT ANGIOGRAM as a surgical intervention .  The patients' history has been reviewed, patient examined, no change in status, stable for surgery.  I have reviewed the patients' chart and labs.  Questions were answered to the patient's satisfaction.     [name]  MD

## 2010-12-07 NOTE — Op Note (Signed)
Cardiac Catheterization Procedure Note  Name: Caleb Bell MRN: [id number] DOB: 09-19-1955  Procedure: Left Heart Cath, Selective Coronary Angiography, SVG-angiography, LIMA angiography.   Indication: Ischemic cardiomyopathy, pre-ICD to assess graft patency.    Procedural details: The right groin was prepped, draped, and anesthetized with 1% lidocaine. Using modified Seldinger technique, a 5 French sheath was introduced into the right femoral artery. Vein grafts were engaged with the MP catheter, LV was entered with the MP catheter, RCA was engaged with the MP catheter.  LCA was engaged with JL-4.  LIMA was engaged with LIMA catheter. Catheter exchanges were performed over a guidewire. There were no immediate procedural complications. The patient was transferred to the post catheterization recovery area for further monitoring.  Procedural Findings: Hemodynamics:  AO 105/20 LV 106/55   Coronary angiography: Coronary dominance: right  Left mainstem: Short vessel, no significant disease.   Left anterior descending (LAD): 90% LAD stenosis before D1.  70% ostial D1 stenosis.  The SVG-D1 was occluded at the ostium.  The LIMA actually touched down on the 2nd diagonal.  The 2nd diagonal was occluded just beyond the LIMA touchdown.  The second diagonal then back-filled the mid to distal LAD with competitive flow from upstream.   Left circumflex (LCx): Total occlusion in the proximal vessel.  The SVG-OM was diffusely diseased throughout the vein graft, reaching up to 90% proximally.   Right coronary artery (RCA): 70% proximal stenosis.  90% mid-vessel stenosis.  Patent PDA.  The PLV was totally occluded about mid-way down the vessel. The SVG-RCA territory was totally occluded proximally.   Left ventriculography: No LV-gram (CKD).   Final Conclusions:  Patent LIMA-D2 (backfilling the LAD).  The SVG-D1 and SVG-RCA territory were totally occluded.  The SVG-OM was severely diseased, up to 90%  stenosis.  Discussed with Dr. [name], potential re-do CABG candidate.  Could re-graft D1, OM, and the PDA/PLV.  He will followup with Dr. [name] for functional study versus cardiac MRI.   60 cc contrast  [name] 12/07/2010, 11:00 AM

## 2010-12-07 NOTE — OR Nursing (Signed)
IV fluids discontinued.  Discharged to home via stretcher.

## 2010-12-07 NOTE — OR Nursing (Signed)
Ambulated to bathroom without bleeding.  Discharge instructions completed.  Iv fluids to run until 3p.

## 2010-12-10 NOTE — H&P (Signed)
  NAME:  Caleb Bell, Caleb Bell NO.:  [id number]  MEDICAL RECORD NO.:  [id number]  LOCATION:  FOOT                         FACILITY:  [facility]  PHYSICIAN:  Ardath Sax, [name]     DATE OF BIRTH:  [birth year]  DATE OF ADMISSION:  09/06/2010 DATE OF DISCHARGE:                             HISTORY & PHYSICAL   This is a 55 year old type 2 diabetic, who also has a history of coronary artery disease, atrial fibrillation, he also has a history of having had a coronary artery bypass, he has a history of Hodgkin disease and he had radiation therapy for this.  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  I debrided callus off of it today and got it nice and clean and I felt like there was a healthy- looking dermis under the callus.  We then put on silver alginate and a total contact cast, he will come back in 2 days and have the casts checked.  I think the cast is going to be perfect to heal this Wagner 2 ulcer.  He has normal had some workup before he came his A1c was 7.1 and he had an x-ray done of the foot which shows no osteo and he is on several medications for hypertension and for his AFib he is on digitalis.  He will return here in 2 days.     Ardath Sax, [name]     PP/MEDQ  D:  09/06/2010  T:  09/06/2010  Job:  [id number]  Electronically Signed by Ardath Sax  on 12/10/2010 02:44:04 PM

## 2010-12-11 ENCOUNTER — Encounter (HOSPITAL_BASED_OUTPATIENT_CLINIC_OR_DEPARTMENT_OTHER): Payer: Self-pay

## 2011-01-01 ENCOUNTER — Ambulatory Visit (INDEPENDENT_AMBULATORY_CARE_PROVIDER_SITE_OTHER): Payer: PRIVATE HEALTH INSURANCE | Admitting: Internal Medicine

## 2011-01-01 VITALS — BP 100/62 | HR 61 | Temp 97.9°F | Resp 18 | Ht 73.5 in | Wt 209.0 lb

## 2011-01-01 DIAGNOSIS — I2510 Atherosclerotic heart disease of native coronary artery without angina pectoris: Secondary | ICD-10-CM

## 2011-01-01 DIAGNOSIS — E785 Hyperlipidemia, unspecified: Secondary | ICD-10-CM

## 2011-01-01 DIAGNOSIS — N259 Disorder resulting from impaired renal tubular function, unspecified: Secondary | ICD-10-CM

## 2011-01-01 DIAGNOSIS — E119 Type 2 diabetes mellitus without complications: Secondary | ICD-10-CM

## 2011-01-01 LAB — BASIC METABOLIC PANEL
BUN: 38 mg/dL — ABNORMAL HIGH (ref 6–23)
CO2: 26 mEq/L (ref 19–32)
Calcium: 9.2 mg/dL (ref 8.4–10.5)
Chloride: 103 mEq/L (ref 96–112)
Creatinine, Ser: 1.5 mg/dL (ref 0.4–1.5)
GFR: 51.41 mL/min — ABNORMAL LOW (ref >60.00)
Glucose, Bld: 287 mg/dL — ABNORMAL HIGH (ref 70–99)
Potassium: 5.3 mEq/L — ABNORMAL HIGH (ref 3.5–5.1)
Sodium: 138 mEq/L (ref 135–145)

## 2011-01-01 LAB — HEMOGLOBIN A1C: Hgb A1c MFr Bld: 9.3 % — ABNORMAL HIGH (ref 4.6–6.5)

## 2011-01-01 NOTE — Assessment & Plan Note (Addendum)
Cath 12-07-10 see report, needs to consider a redo CABG F/u by cards

## 2011-01-01 NOTE — Assessment & Plan Note (Signed)
To see D r Patel soon

## 2011-01-01 NOTE — Progress Notes (Signed)
  Subjective:    Patient ID: Caleb Bell, male    DOB: 1955-03-25, 55 y.o.   MRN: [id number]  HPI ROV Diabetes-not taking Glucophage for a while due to chronic renal insufficiency. Room for improvement on his diet and exercise. Was recommended to increase Amaryl to 6 mg but still taking 4 mg Cellulitis--improved High cholesterol--was recommended to increase simvastatin to 60 mg, he still taking 40 mg daily. CAD--status post cath 2 weeks ago, they are considering a redo CABG  Past medical history DM Neuropathy: foot ulcer 8-12 Hyperlipidemia, low HDL Cardiovascular: --EF  78% range /   EF 20-25%...echo..06/14/2009, EF 30-35%, echo, January, 2012  --CAD----> CABG 2003 --Atrial fibrillation , paroxismal  --ICVD Hypothyroidism ABUSE, COCAINE, ALCOHOL, ETC ...resolved for many years CRI: --Creatinine... up to 1.5.Marland KitchenMarland KitchenMarland Kitchen 2011  /  CKD 3.. multifactorial... Dr. Allena Katz.. consult August 22 2009 --Hyperkalemia  8- 2011, resolved after aldactone was d/c  HODGKIN'S DISEASE--Dx aprox 2004, s/p XRT-Chemo   Review of Systems No chest or shortness of breath Note nausea, vomiting, diarrhea    Objective:   Physical Exam  Constitutional: He is oriented to person, place, and time. He appears well-developed and well-nourished. No distress.  Pulmonary/Chest: Effort normal. No respiratory distress. He has no wheezes. He has no rales.  Musculoskeletal: He exhibits no edema.  Neurological: He is alert and oriented to person, place, and time.  Skin: He is not diaphoretic.       Previously seen right foot ulcer essentially resolved. He has a small callus in the R fifth toe without discharge  Psychiatric: He has a normal mood and affect. His behavior is normal. Judgment and thought content normal.      Assessment & Plan:  Long discussion about benefits of flu shot: declined

## 2011-01-01 NOTE — Assessment & Plan Note (Addendum)
LDL goal 70, not well controlled Change to lipitor 40? Interacts w/ digoxin Other statins? Cost is an issue Plan: Will work on diet, continue simva

## 2011-01-01 NOTE — Assessment & Plan Note (Addendum)
Neuropathy ---> S/p foot ulcer (R) 08-2010, doing great Has a residual callous at 5th R toe, local care discussed Feet care discussed Also off metformin x a while d/t CRI ----> labs  Discussed possible insulin, pt not inclined to do that

## 2011-01-04 ENCOUNTER — Encounter: Payer: Self-pay | Admitting: *Deleted

## 2011-01-04 ENCOUNTER — Ambulatory Visit: Payer: PRIVATE HEALTH INSURANCE | Admitting: Cardiology

## 2011-01-07 ENCOUNTER — Telehealth: Payer: Self-pay | Admitting: Cardiology

## 2011-01-07 NOTE — Telephone Encounter (Signed)
Pt is not sure if he needs the appt or not.  He was under the impression that he needs a test only (?MRI), not an appt.

## 2011-01-07 NOTE — Telephone Encounter (Signed)
New msg Pt wants to talk to you about appt he is to have tomorrow please call

## 2011-01-08 ENCOUNTER — Ambulatory Visit: Payer: PRIVATE HEALTH INSURANCE | Admitting: Cardiology

## 2011-01-08 ENCOUNTER — Other Ambulatory Visit: Payer: Self-pay

## 2011-01-08 DIAGNOSIS — I255 Ischemic cardiomyopathy: Secondary | ICD-10-CM

## 2011-01-08 DIAGNOSIS — Z0189 Encounter for other specified special examinations: Secondary | ICD-10-CM

## 2011-01-09 NOTE — Telephone Encounter (Signed)
I called Patient

## 2011-01-10 ENCOUNTER — Ambulatory Visit (HOSPITAL_COMMUNITY)
Admission: RE | Admit: 2011-01-10 | Discharge: 2011-01-10 | Disposition: A | Discharge status: Home / Self Care | Payer: PRIVATE HEALTH INSURANCE | Source: Ambulatory Visit | Attending: Cardiology | Admitting: Cardiology

## 2011-01-10 DIAGNOSIS — I255 Ischemic cardiomyopathy: Secondary | ICD-10-CM

## 2011-01-10 DIAGNOSIS — Z0189 Encounter for other specified special examinations: Secondary | ICD-10-CM

## 2011-01-10 DIAGNOSIS — I517 Cardiomegaly: Secondary | ICD-10-CM | POA: Insufficient documentation

## 2011-01-10 DIAGNOSIS — I2589 Other forms of chronic ischemic heart disease: Secondary | ICD-10-CM | POA: Insufficient documentation

## 2011-01-10 DIAGNOSIS — I2510 Atherosclerotic heart disease of native coronary artery without angina pectoris: Secondary | ICD-10-CM

## 2011-01-10 MED ORDER — GADOBENATE DIMEGLUMINE 529 MG/ML IV SOLN
20.0000 mL | Freq: Once | INTRAVENOUS | Status: AC
  Administered 2011-01-10: 20 mL via INTRAVENOUS

## 2011-01-20 ENCOUNTER — Other Ambulatory Visit: Payer: Self-pay | Admitting: Internal Medicine

## 2011-01-29 ENCOUNTER — Encounter: Payer: Self-pay | Admitting: Cardiology

## 2011-01-29 NOTE — Progress Notes (Signed)
When the patient saw Dr. Graciela Husbands in October, 2012 it was felt  That we needed more information about the patient's coronary status before finalizing plans for a possible ICD. The patient underwent cardiac catheterization. With very careful review it was felt that cardiac MRI data was needed before any decisions could be made about possible intervention. The patient had a cardiac MRI. I have reviewed the results carefully with Dr.McLean. There is decreased viability in several areas. It is felt that revascularization would not be appropriate at this time. He has good flow into a large diagonal that fills his LAD very well. These areas are not in jeopardy. The patient is not having any significant symptoms.  Since coronary revascularization is not indicated at this time,  I will be contacting Dr. Graciela Husbands to ask him to see the patient again in followup and decide if an ICD can be placed. I have discussed this issue with the patient and he is in agreement.  The patient is a Technical sales engineer. He will discuss carefully options with Dr. Graciela Husbands concerning the position of the ICD to be sure that it does not interfere with his ability to use all of the instruments that he plays.

## 2011-02-13 ENCOUNTER — Other Ambulatory Visit: Payer: Self-pay | Admitting: Internal Medicine

## 2011-02-13 NOTE — Telephone Encounter (Signed)
Done

## 2011-02-17 ENCOUNTER — Other Ambulatory Visit: Payer: Self-pay | Admitting: Internal Medicine

## 2011-02-18 NOTE — Telephone Encounter (Signed)
Refill done.  

## 2011-02-20 ENCOUNTER — Other Ambulatory Visit: Payer: Self-pay | Admitting: Internal Medicine

## 2011-02-20 NOTE — Telephone Encounter (Signed)
I called [facility] and informed per MD ok to fill generic

## 2011-02-20 NOTE — Telephone Encounter (Signed)
Yes

## 2011-02-21 ENCOUNTER — Institutional Professional Consult (permissible substitution): Payer: PRIVATE HEALTH INSURANCE | Admitting: Internal Medicine

## 2011-02-21 ENCOUNTER — Encounter: Payer: Self-pay | Admitting: Internal Medicine

## 2011-02-21 ENCOUNTER — Ambulatory Visit (INDEPENDENT_AMBULATORY_CARE_PROVIDER_SITE_OTHER): Payer: PRIVATE HEALTH INSURANCE | Admitting: Internal Medicine

## 2011-02-21 DIAGNOSIS — I4891 Unspecified atrial fibrillation: Secondary | ICD-10-CM

## 2011-02-21 DIAGNOSIS — I2589 Other forms of chronic ischemic heart disease: Secondary | ICD-10-CM

## 2011-02-21 DIAGNOSIS — I509 Heart failure, unspecified: Secondary | ICD-10-CM

## 2011-02-21 NOTE — Assessment & Plan Note (Signed)
I will review with Dr. [name] if it is appropriate to add Aldactone

## 2011-02-21 NOTE — Assessment & Plan Note (Signed)
The patient has ischemic cardiac myopathy with persistent depression of LV systolic function without option for revascularization. It is appropriate at this point to pursue ICD implantation for primary prevention of sudden cardiac death. We reviewed the potential benefits and risks including but not limited to infection device malfunction inappropriate shocks and lead dislodgment he understands these risks and is willing to proceed.  He has a broad QRS duration with an IVCD pattern. It is greater than 150 ms and so I think it is reasonable to attempt left ventricular lead placement realizing that we may see no benefit, may see harm in that case the lead could be programmed off. I have reviewed with him the controversies related to CRT implantation in this population subset.

## 2011-02-21 NOTE — Assessment & Plan Note (Signed)
I don't know about the atrial fibrillation.  We will not need to try to clarify that and our dual-chamber device will help [place]. In atrial fibrillation seen anticoagulation would be appropriate

## 2011-02-21 NOTE — Progress Notes (Signed)
HPI  Caleb Bell is a 56 y.o. male Seen in followup for consideration of an ICD  He is status post bypass surgery in 2003  He underwent cath since we first discussed demonstrating significant native and graft disease and then underwent MRI showing no significant viability to justify revascularization EF about 25%  He had no palpitations and no syncope.  His exercise tolerance is moderately good.  He has diabetes.    Past Medical History  Diagnosis Date  . Hodgkin's disease      Dx aprox 2004, s/p XRT-Chemo  . CAD (coronary artery disease)     CABG 2003  . Hyperlipidemia     low HDL  . DM type 2 (diabetes mellitus, type 2)   . Hypothyroidism   . Abuse     Alcohol, cocaine- resoloved for many years   . Atrial fibrillation     Paroxysmal, limited  . IVCD (intraventricular conduction defect)   . CRI (chronic renal insufficiency)      multifactorial... Dr. Allena Katz.. consult September 18, 2009  . Hyperkalemia     August, 2011, Aldactone, Aldactone stopped  . CHF (congestive heart failure)     EF 20-25% echo 5- 2011  . Ejection fraction < 50%     EF 20-25%, echo, May, 2011 / ejection fraction 30-35%, echo, January, 2012  . Hx of radiation therapy     For Hodgkin's disease  . Hx of CABG     2003  . EFE (endocardial fibroelastosis)   . ETOH abuse   . Elevated serum creatinine     Past Surgical History  Procedure Date  . Tonsillectomy   . Coronary artery bypass graft   . Cystectomy     Upper Back    Current Outpatient Prescriptions  Medication Sig Dispense Refill  . aspirin 325 MG tablet Take 325 mg by mouth daily.        . benazepril (LOTENSIN) 20 MG tablet TAKE 1/2 TABLET BY MOUTH EVERY DAY  15 tablet  4  . carvedilol (COREG) 25 MG tablet TAKE ONE TABLET BY MOUTH TWICE DAILY  60 tablet  9  . furosemide (LASIX) 40 MG tablet TAKE ONE TABLET BY MOUTH ONE TIME DAILY  30 tablet  4  . glimepiride (AMARYL) 4 MG tablet Take 4 mg by mouth daily before breakfast.        .  HYDROcodone-acetaminophen (VICODIN) 5-500 MG per tablet Take 1 tablet by mouth every 4 (four) hours as needed for pain.  40 tablet  0  . LANOXIN 0.125 MG tablet TAKE ONE TABLET BY MOUTH ONE TIME DAILY  30 each  2  . levothyroxine (SYNTHROID, LEVOTHROID) 88 MCG tablet TAKE 1 TABLET BY MOUTH DAILY. NEEDS OFFICE VISIT.  30 tablet  4  . simvastatin (ZOCOR) 40 MG tablet Take 40 mg by mouth at bedtime.       . simvastatin (ZOCOR) 40 MG tablet TAKE  ONE TABLET BY MOUTH NIGHTLY AT BEDTIME  30 tablet  5    No Known Allergies  Review of Systems negative except from HPI and PMH  Physical Exam There were no vitals taken for this visit. Well developed and well nourished in no acute distress HENT normal E scleral and icterus clear Neck Supple JVP flat; carotids brisk and full Clear to ausculation Regular rate and rhythm, no murmurs gallops or rub Soft with active bowel sounds No clubbing cyanosis none Edema Alert and oriented, grossly normal motor and sensory function Skin Warm and  Dry  ECG demonstrates sinus rhythm at 53 Intervals 0.19/0.16/0.44 Axis is -31 IVCD with Q waves in lead 1 and aVL  Assessment and  Plan

## 2011-02-21 NOTE — Patient Instructions (Signed)
Your physician has recommended that you have a defibrillator inserted. An implantable cardioverter defibrillator (ICD) is a small device that is placed in your chest or, in rare cases, your abdomen. This device uses electrical pulses or shocks to help control life-threatening, irregular heartbeats that could lead the heart to suddenly stop beating (sudden cardiac arrest). Leads are attached to the ICD that goes into your heart. This is done in the hospital and usually requires an overnight stay. Please see the instruction sheet given to you today for more information.  Please call Dr. Odessa Fleming nurse, Herbert Seta, when you are ready to set this up. [phone].

## 2011-02-22 ENCOUNTER — Telehealth: Payer: Self-pay | Admitting: Internal Medicine

## 2011-02-22 ENCOUNTER — Encounter: Payer: Self-pay | Admitting: *Deleted

## 2011-02-22 DIAGNOSIS — I255 Ischemic cardiomyopathy: Secondary | ICD-10-CM

## 2011-02-22 DIAGNOSIS — Z01810 Encounter for preprocedural cardiovascular examination: Secondary | ICD-10-CM

## 2011-02-22 NOTE — Telephone Encounter (Signed)
PT CALLING SET UP SURGERY

## 2011-02-22 NOTE — Telephone Encounter (Signed)
I spoke with the patient. He has been scheduled for his Bi-V ICD implant on 04/01/11 at 7:30am. He will come for labwork on 03/25/11. Letter of instructions has been mailed to the patient.

## 2011-02-22 NOTE — Telephone Encounter (Signed)
Addended by: Sherri Rad C on: 02/22/2011 10:43 AM   Modules accepted: Orders

## 2011-02-24 ENCOUNTER — Other Ambulatory Visit: Payer: Self-pay | Admitting: Internal Medicine

## 2011-02-24 DIAGNOSIS — I5022 Chronic systolic (congestive) heart failure: Secondary | ICD-10-CM

## 2011-02-27 ENCOUNTER — Telehealth: Payer: Self-pay | Admitting: Cardiology

## 2011-02-27 NOTE — Telephone Encounter (Signed)
Caleb Bell states that he saw Dr Graciela Husbands on 02/21/11 and wants to know if he needs to keep his appt with Dr [name] on 03/01/11.  He states he feels that Dr Graciela Husbands dealt with his current problem that day but will come in if Dr [name] recommends it.

## 2011-02-27 NOTE — Telephone Encounter (Signed)
New Problem   Patient was seen in the office 02/21/11, scheduled for 03/01/11 appointment but questions the necessity.  Please return to patient at hm# [phone]

## 2011-02-28 NOTE — Telephone Encounter (Signed)
I have spoken with the patient.

## 2011-03-01 ENCOUNTER — Ambulatory Visit: Payer: PRIVATE HEALTH INSURANCE | Admitting: Cardiology

## 2011-03-13 ENCOUNTER — Encounter: Payer: Self-pay | Admitting: Internal Medicine

## 2011-03-15 ENCOUNTER — Encounter (HOSPITAL_COMMUNITY): Payer: Self-pay | Admitting: Pharmacy Technician

## 2011-03-22 HISTORY — PX: CARDIAC DEFIBRILLATOR PLACEMENT: SHX171

## 2011-03-25 ENCOUNTER — Other Ambulatory Visit (INDEPENDENT_AMBULATORY_CARE_PROVIDER_SITE_OTHER): Payer: PRIVATE HEALTH INSURANCE

## 2011-03-25 DIAGNOSIS — I2589 Other forms of chronic ischemic heart disease: Secondary | ICD-10-CM

## 2011-03-25 DIAGNOSIS — I255 Ischemic cardiomyopathy: Secondary | ICD-10-CM

## 2011-03-25 DIAGNOSIS — Z01810 Encounter for preprocedural cardiovascular examination: Secondary | ICD-10-CM

## 2011-03-25 LAB — CBC WITH DIFFERENTIAL/PLATELET
Basophils Absolute: 0.1 10*3/uL (ref 0.0–0.1)
Basophils Relative: 0.8 % (ref 0.0–3.0)
Eosinophils Absolute: 0.3 10*3/uL (ref 0.0–0.7)
Eosinophils Relative: 4.1 % (ref 0.0–5.0)
HCT: 36.8 % — ABNORMAL LOW (ref 39.0–52.0)
Hemoglobin: 12.4 g/dL — ABNORMAL LOW (ref 13.0–17.0)
Lymphocytes Relative: 31.9 % (ref 12.0–46.0)
Lymphs Abs: 2.1 10*3/uL (ref 0.7–4.0)
MCHC: 33.8 g/dL (ref 30.0–36.0)
MCV: 81.7 fl (ref 78.0–100.0)
Monocytes Absolute: 0.5 10*3/uL (ref 0.1–1.0)
Monocytes Relative: 6.8 % (ref 3.0–12.0)
Neutro Abs: 3.7 10*3/uL (ref 1.4–7.7)
Neutrophils Relative %: 56.4 % (ref 43.0–77.0)
Platelets: 182 10*3/uL (ref 150.0–400.0)
RBC: 4.5 Mil/uL (ref 4.22–5.81)
RDW: 14 % (ref 11.5–14.6)
WBC: 6.6 10*3/uL (ref 4.5–10.5)

## 2011-03-25 LAB — BASIC METABOLIC PANEL
BUN: 35 mg/dL — ABNORMAL HIGH (ref 6–23)
CO2: 22 mEq/L (ref 19–32)
Calcium: 9.1 mg/dL (ref 8.4–10.5)
Chloride: 109 mEq/L (ref 96–112)
Creatinine, Ser: 1.3 mg/dL (ref 0.4–1.5)
GFR: 58.95 mL/min — ABNORMAL LOW (ref >60.00)
Glucose, Bld: 154 mg/dL — ABNORMAL HIGH (ref 70–99)
Potassium: 4.1 mEq/L (ref 3.5–5.1)
Sodium: 138 mEq/L (ref 135–145)

## 2011-03-25 LAB — PROTIME-INR
INR: 1 ratio (ref 0.8–1.0)
Prothrombin Time: 10.6 s (ref 10.2–12.4)

## 2011-03-31 MED ORDER — CEFAZOLIN SODIUM 1-5 GM-% IV SOLN: 1.0000 g | INTRAVENOUS | Status: DC

## 2011-03-31 MED ORDER — GENTAMICIN SULFATE 40 MG/ML IJ SOLN
80.0000 mg | INTRAMUSCULAR | Status: DC
  Filled 2011-03-31 (×2): qty 2

## 2011-04-01 ENCOUNTER — Ambulatory Visit: Payer: PRIVATE HEALTH INSURANCE | Admitting: Internal Medicine

## 2011-04-01 ENCOUNTER — Ambulatory Visit (HOSPITAL_COMMUNITY)
Admission: RE | Admit: 2011-04-01 | Discharge: 2011-04-02 | Disposition: A | Discharge status: Home / Self Care | Payer: PRIVATE HEALTH INSURANCE | Source: Ambulatory Visit | Attending: Internal Medicine | Admitting: Internal Medicine

## 2011-04-01 ENCOUNTER — Encounter (HOSPITAL_COMMUNITY)
Admission: RE | Disposition: A | Discharge status: Home / Self Care | Payer: Self-pay | Source: Ambulatory Visit | Attending: Internal Medicine

## 2011-04-01 DIAGNOSIS — I509 Heart failure, unspecified: Secondary | ICD-10-CM | POA: Insufficient documentation

## 2011-04-01 DIAGNOSIS — I2510 Atherosclerotic heart disease of native coronary artery without angina pectoris: Secondary | ICD-10-CM | POA: Insufficient documentation

## 2011-04-01 DIAGNOSIS — I2589 Other forms of chronic ischemic heart disease: Secondary | ICD-10-CM

## 2011-04-01 DIAGNOSIS — E119 Type 2 diabetes mellitus without complications: Secondary | ICD-10-CM | POA: Insufficient documentation

## 2011-04-01 DIAGNOSIS — E785 Hyperlipidemia, unspecified: Secondary | ICD-10-CM | POA: Insufficient documentation

## 2011-04-01 DIAGNOSIS — I5022 Chronic systolic (congestive) heart failure: Secondary | ICD-10-CM | POA: Insufficient documentation

## 2011-04-01 HISTORY — PX: BI-VENTRICULAR IMPLANTABLE CARDIOVERTER DEFIBRILLATOR: SHX5459

## 2011-04-01 LAB — SURGICAL PCR SCREEN
MRSA, PCR: NEGATIVE
Staphylococcus aureus: NEGATIVE

## 2011-04-01 LAB — GLUCOSE, CAPILLARY
Glucose-Capillary: 161 mg/dL — ABNORMAL HIGH (ref 70–99)
Glucose-Capillary: 145 mg/dL — ABNORMAL HIGH (ref 70–99)
Glucose-Capillary: 180 mg/dL — ABNORMAL HIGH (ref 70–99)
Glucose-Capillary: 218 mg/dL — ABNORMAL HIGH (ref 70–99)

## 2011-04-01 SURGERY — BI-VENTRICULAR IMPLANTABLE CARDIOVERTER DEFIBRILLATOR  (CRT-D)
Anesthesia: LOCAL

## 2011-04-01 MED ORDER — CEFAZOLIN SODIUM 1-5 GM-% IV SOLN
INTRAVENOUS | Status: AC
  Filled 2011-04-01: qty 50

## 2011-04-01 MED ORDER — SODIUM CHLORIDE 0.9 % IV SOLN
INTRAVENOUS | Status: DC
  Administered 2011-04-01: 06:00:00 via INTRAVENOUS

## 2011-04-01 MED ORDER — LEVOTHYROXINE SODIUM 88 MCG PO TABS
88.0000 ug | ORAL_TABLET | Freq: Every day | ORAL | Status: DC
  Administered 2011-04-01 – 2011-04-02 (×2): 88 ug via ORAL
  Filled 2011-04-01 (×2): qty 1

## 2011-04-01 MED ORDER — SODIUM CHLORIDE 0.45 % IV SOLN: INTRAVENOUS | Status: DC

## 2011-04-01 MED ORDER — ACETAMINOPHEN 325 MG PO TABS
325.0000 mg | ORAL_TABLET | ORAL | Status: DC | PRN
  Administered 2011-04-01 – 2011-04-02 (×3): 650 mg via ORAL
  Filled 2011-04-01 (×3): qty 2

## 2011-04-01 MED ORDER — FUROSEMIDE 40 MG PO TABS
40.0000 mg | ORAL_TABLET | Freq: Every day | ORAL | Status: DC
  Administered 2011-04-01 – 2011-04-02 (×2): 40 mg via ORAL
  Filled 2011-04-01 (×2): qty 1

## 2011-04-01 MED ORDER — FENTANYL CITRATE 0.05 MG/ML IJ SOLN
INTRAMUSCULAR | Status: AC
  Filled 2011-04-01: qty 2

## 2011-04-01 MED ORDER — ASPIRIN 325 MG PO TABS
325.0000 mg | ORAL_TABLET | Freq: Every day | ORAL | Status: DC
Start: 2011-04-01 — End: 2011-04-02
  Administered 2011-04-01 – 2011-04-02 (×2): 325 mg via ORAL
  Filled 2011-04-01 (×2): qty 1

## 2011-04-01 MED ORDER — MIDAZOLAM HCL 5 MG/5ML IJ SOLN
INTRAMUSCULAR | Status: AC
  Filled 2011-04-01: qty 5

## 2011-04-01 MED ORDER — GLIMEPIRIDE 4 MG PO TABS
4.0000 mg | ORAL_TABLET | Freq: Every day | ORAL | Status: DC
  Administered 2011-04-02: 4 mg via ORAL
  Filled 2011-04-01 (×2): qty 1

## 2011-04-01 MED ORDER — CEFAZOLIN SODIUM 1-5 GM-% IV SOLN
1.0000 g | Freq: Four times a day (QID) | INTRAVENOUS | Status: AC
Start: 2011-04-01 — End: 2011-04-02
  Administered 2011-04-01 – 2011-04-02 (×3): 1 g via INTRAVENOUS
  Filled 2011-04-01 (×3): qty 50

## 2011-04-01 MED ORDER — DIGOXIN 125 MCG PO TABS
125.0000 ug | ORAL_TABLET | Freq: Every day | ORAL | Status: DC
  Administered 2011-04-01 – 2011-04-02 (×2): 125 ug via ORAL
  Filled 2011-04-01 (×2): qty 1

## 2011-04-01 MED ORDER — HEPARIN (PORCINE) IN NACL 2-0.9 UNIT/ML-% IJ SOLN
INTRAMUSCULAR | Status: AC
  Filled 2011-04-01: qty 1000

## 2011-04-01 MED ORDER — LIDOCAINE HCL (PF) 1 % IJ SOLN
INTRAMUSCULAR | Status: AC
  Filled 2011-04-01: qty 60

## 2011-04-01 MED ORDER — SIMVASTATIN 40 MG PO TABS
40.0000 mg | ORAL_TABLET | Freq: Every evening | ORAL | Status: DC
  Administered 2011-04-01: 40 mg via ORAL
  Filled 2011-04-01 (×2): qty 1

## 2011-04-01 MED ORDER — CHLORHEXIDINE GLUCONATE 4 % EX LIQD: 60.0000 mL | Freq: Once | CUTANEOUS | Status: DC

## 2011-04-01 MED ORDER — ONDANSETRON HCL 4 MG/2ML IJ SOLN: 4.0000 mg | Freq: Four times a day (QID) | INTRAMUSCULAR | Status: DC | PRN

## 2011-04-01 MED ORDER — CARVEDILOL 25 MG PO TABS
25.0000 mg | ORAL_TABLET | Freq: Two times a day (BID) | ORAL | Status: DC
  Administered 2011-04-01 – 2011-04-02 (×2): 25 mg via ORAL
  Filled 2011-04-01 (×4): qty 1

## 2011-04-01 MED ORDER — MUPIROCIN 2 % EX OINT
TOPICAL_OINTMENT | CUTANEOUS | Status: AC
  Filled 2011-04-01: qty 22

## 2011-04-01 MED ORDER — BENAZEPRIL HCL 10 MG PO TABS
10.0000 mg | ORAL_TABLET | Freq: Every day | ORAL | Status: DC
  Administered 2011-04-01 – 2011-04-02 (×2): 10 mg via ORAL
  Filled 2011-04-01 (×2): qty 1

## 2011-04-01 NOTE — Brief Op Note (Signed)
04/01/2011  10:09 AM  PATIENT:  Caleb Bell  56 y.o. male  PRE-OPERATIVE DIAGNOSIS:  Ischemic cardiomyopathy  POST-OPERATIVE DIAGNOSIS:  Ischemic cardiomyopathy  PROCEDURE:  Procedure(s) (LRB): BI-VENTRICULAR IMPLANTABLE CARDIOVERTER DEFIBRILLATOR  (CRT-D) (N/A)  SURGEON:  Surgeon(s) and Role:    * Duke Salvia, MD - Primary  PHYSICIAN ASSISTANT:   ASSISTANTS: none   ANESTHESIA:   local and general  E   DICTATION: .Other Dictation: Dictation Number [id number]    PATIENT DISPOSITION:  PACU - hemodynamically stable.   Delay start of Pharmacological VTE agent (>24hrs) due to surgical blood loss or risk of bleeding: no

## 2011-04-01 NOTE — Progress Notes (Signed)
Orthopedic Tech Progress Note Patient Details:  Caleb Bell 04-10-1955 [id number]      [name] 04/01/2011, 4:01 PM Pt has arm sling

## 2011-04-01 NOTE — H&P (Signed)
History and Physical  Patient ID: Caleb Bell MRN: [id number], SOB: Mar 13, 1955 56 y.o. Date of Encounter: 04/01/2011, 7:21 AM   Primary Physician: Willow Ora, MD, MD Primary Cardiologist: jk Primary Electrophysiologist:  sk  Chief Complaint: here for icd  History of Present Illness: Caleb Bell is a 56 y.o. male Seen for implantation of an ICD; he has IVCD so will anticipate CRT  He is status post bypass surgery in 2003 He underwent cath since we first discussed demonstrating significant native and graft disease and then underwent MRI showing no significant viability to justify revascularization  EF about 25%  He had no palpitations and no syncope.  His exercise tolerance is moderately good.        Past Medical History  Diagnosis Date  . Hodgkin's disease      Dx aprox 2004, s/p XRT-Chemo  . CAD (coronary artery disease)     CABG 2003  . Hyperlipidemia     low HDL  . DM type 2 (diabetes mellitus, type 2)   . Hypothyroidism   . Polysubstance     Alcohol, cocaine- resoloved for many years   . Atrial fibrillation     Paroxysmal, limited  . IVCD (intraventricular conduction defect)   . CRI (chronic renal insufficiency)      multifactorial... Dr. Allena Katz.. consult September 18, 2009  . Hyperkalemia     August, 2011, Aldactone, Aldactone stopped  . Chronic systolic heart failure     EF 40-98% echo 5- 2011  . Ischemic cardiomyopathy     MRI12/12 no real viability in hypo/akinetic segment  CAth native and graft disease  . EFE (endocardial fibroelastosis)   . Elevated serum creatinine      Past Surgical History  Procedure Date  . Tonsillectomy   . Coronary artery bypass graft   . Cystectomy     Upper Back      Current Facility-Administered Medications  Medication Dose Route Frequency Provider Last Rate Last Dose  . 0.45 % sodium chloride infusion   Intravenous Continuous Duke Salvia, MD      . 0.9 %  sodium chloride infusion   Intravenous Continuous  Duke Salvia, MD 50 mL/hr at 04/01/11 0606    . ceFAZolin (ANCEF) 1-5 GM-% IVPB           . ceFAZolin (ANCEF) IVPB 1 g/50 mL premix  1 g Intravenous On Call Duke Salvia, MD      . chlorhexidine (HIBICLENS) 4 % liquid 4 application  60 mL Topical Once Duke Salvia, MD      . gentamicin (GARAMYCIN) 80 mg in sodium chloride irrigation 0.9 % 500 mL irrigation  80 mg Irrigation On Call Duke Salvia, MD      . heparin 2-0.9 UNIT/ML-% infusion           . lidocaine (XYLOCAINE) 1 % injection           . mupirocin ointment (BACTROBAN) 2 %              Allergies: No Known Allergies   History  Substance Use Topics  . Smoking status: Never Smoker   . Smokeless tobacco: Never Used  . Alcohol Use: No     Hx of abuse/clean since 2003       Family History  Problem Relation Age of Onset  . Adopted: Yes        Vital Signs: Blood pressure 116/60, pulse 55, temperature 97 F (36.1 C), temperature  source Oral, resp. rate 18, height 6\' 1"  (1.854 m), weight 200 lb (90.719 kg).  PHYSICAL EXAM: General:  Well nourished, well developed male in no acute distress  HEENT: normal Lymph: no adenopathy Neck: no JVD Endocrine:  No thryomegaly Vascular: No carotid bruits; FA pulses 2+ bilaterally without bruits Cardiac:  normal S1, S2; RRR; no murmur Back: without kyphosis/scoliosis, no CVA tenderness Lungs:  clear to auscultation bilaterally, no wheezing, rhonchi or rales Abd: soft, nontender, no hepatomegaly Ext: no edema Musculoskeletal:  No deformities, BUE and BLE strength normal and equal Skin: warm and dry Neuro:  CNs 2-12 intact, no focal abnormalities noted Psych:  Normal affect   EKG:     Labs:   Lab Results  Component Value Date   WBC 6.6 03/25/2011   HGB 12.4* 03/25/2011   HCT 36.8* 03/25/2011   MCV 81.7 03/25/2011   PLT 182.0 03/25/2011    Lab 03/25/11 0924  NA 138  K 4.1  CL 109  CO2 22  BUN 35*  CREATININE 1.3  CALCIUM 9.1  PROT --  BILITOT --  ALKPHOS --  ALT  --  AST --  GLUCOSE 154*   No results found for this basename: CKTOTAL:4,CKMB:4,TROPONINI:4 in the last 72 hours Lab Results  Component Value Date   CHOL 179 08/28/2010   HDL 37.60* 08/28/2010   LDLCALC 56 09/08/2008   TRIG 256.0* 08/28/2010   No results found for this basename: DDIMER   BNP No results found for this basename: probnp       ASSESSMENT AND PLAN:   for ICD implantation with CRT  Have reviewed the potential benefits and risks of ICD implantation including but not limited to death, perforation of heart or lung, lead dislodgement, infection,  device malfunction and inappropriate shocks.  The patient express understanding  and are willing to proceed.

## 2011-04-02 ENCOUNTER — Other Ambulatory Visit: Payer: Self-pay

## 2011-04-02 ENCOUNTER — Ambulatory Visit (HOSPITAL_COMMUNITY): Payer: PRIVATE HEALTH INSURANCE

## 2011-04-02 DIAGNOSIS — I5022 Chronic systolic (congestive) heart failure: Secondary | ICD-10-CM

## 2011-04-02 LAB — GLUCOSE, CAPILLARY: Glucose-Capillary: 135 mg/dL — ABNORMAL HIGH (ref 70–99)

## 2011-04-02 MED ORDER — HEPARIN SODIUM (PORCINE) 1000 UNIT/ML IJ SOLN
INTRAMUSCULAR | Status: AC
  Filled 2011-04-02: qty 1

## 2011-04-02 NOTE — Op Note (Signed)
NAME:  Caleb Bell, Caleb Bell NO.:  [id number]  MEDICAL RECORD NO.:  [id number]  LOCATION:  2020                         FACILITY:  [facility]  PHYSICIAN:  Duke Salvia, MD, FACCDATE OF BIRTH:  December 29, 1955  DATE OF PROCEDURE:  04/01/2011 DATE OF DISCHARGE:                              OPERATIVE REPORT   SURGEON:  Duke Salvia, MD, [facility]  PREOPERATIVE DIAGNOSIS:  Ischemic cardiomyopathy with an intraventricular conduction delay of greater than 150 milliseconds.  POSTOPERATIVE DIAGNOSIS:  Ischemic cardiomyopathy with an intraventricular conduction delay of greater than 150 milliseconds.  PROCEDURE:  Dual-chamber defibrillator implantation with left ventricular lead placement.  PROCEDURE:  Following obtaining informed consent, the patient was brought to the Electrophysiology Laboratory and placed on the fluoroscopic table in supine position.  After routine prep and drape of the left upper chest, lidocaine was injected in the prepectoral subclavicular region and incision was made and carried down to the layer of the prepectoral fascia using electrocautery and sharp dissection.  A pocket was formed similarly.  Hemostasis was obtained.  Thereafter, attention was turned to gain access to the extrathoracic left subclavian vein, which was accomplished with mild difficulty, but without the aspiration of air or puncture of the artery.  Three separate venipunctures were accomplished.  Guidewires were placed and retained, and sequentially, an 8-French, 9.5-French and 7-French sheaths were placed, through which were passed sequentially a St. Jude [id number] single coil active fixation defibrillator lead, serial number [id number], a left ventricular cannulation catheter - Medtronic MB1 and a St. Jude [id number] 52-cm active fixation atrial lead, serial number [id number].  Under fluoroscopic guidance, the defibrillator lead was manipulated the right ventricular distal septum where after  mapping multiple sites in the apex, the lead was deployed.  The R-wave was 18.9 with a pace impedance of 814, the threshold of 0.5 at 0.5.  Current of threshold was 0.7 mA. There was no diaphragmatic pacing at 10 volts and the current of injury was brisk.  The lead was secured to the prepectoral fascia.  Through the MB2, a Wholey wire was placed, which allowed for cannulation of the coronary artery sinus.  However, it turned out that there was a valve in the body of the coronary artery sinus.  Venography did not demonstrate a low line lateral branch.  We then spent about 30 or 45 minutes trying to get past the valve.  I was able to get a single Whisper wire past, this failed to give sufficient traction for a lead or the Attain II system.  I then tried to put a Wholey wire, passed down successful in doing this, but this went into a branch of the coronary artery sinus as opposed to the main conduit and it had to be withdrawn. We then took a series of other 0.014 wire as a second Whisper wire and then a Mailman wire, and deployed them all past the valve into the coronary artery sinus system and this allowed for passing of the Attain II 90-degree catheter, deployed over these 3 wires with the dilator in place.  The dilator was then removed.  The MB2 was deployed across the valve and the Attain II was removed.  We  then took a venogram, did not see much and then blindly mapped with a wire and found an anterolateral branch that could take the lead.  A more lateral branch failed to allow for passage of the lead.  We placed a St. Jude [id number] lead, serial number [id number] at the junction between the proximal and mid third on the high anterolateral wall.  The deployment system was removed.  The lead position was stable and the leads were then all secured to the prepectoral fascia and then attached to a St. Jude [id number] triple chamber defibrillator, serial number [id number].  Through the device, bipolar  P- wave was 1.4-2.2 millivolts with a pacing impedance of 390, threshold of 0.7 at 0.5.  The R-wave was greater than 12 with a pace impedance of 650, a threshold of 0.5 at 0.5, and the LV lead was 530, threshold of 0.5 at 0.5.  I should note that the RA lead was deployed following the deployment of the LV lead, but prior to the removal of the delivery system.  The amplitude was 2.3 with a pace impedance of 490 and threshold of 0.6 at 0.5.  Current of threshold was 1.2 mA.  There was no diaphragmatic pacing at 10 volts and the current of injury was brisk.  Following the leads had been attached to the device was placed in the pocket and the measured impedance was 71 ohms.  Defibrillation threshold was undertaken.  Ventricular fibrillation was induced via the T-wave shock.  After total duration of 4.5 seconds, a 16-joule shock was delivered through a measured resistance of 71 ohms terminating ventricular fibrillation restoring an AV paced rhythm.  The device was implanted.  The pocket was copiously irrigated with antibiotic- containing saline solution.  Hemostasis was assured.  Leads and the pulse generator were placed in the pocket secured to the prepectoral fascia.  The wound was closed in 3 layers in normal fashion.  The wound was washed, dried, and a benzoin, Steri-Strip dressing was applied. Needle counts, sponge counts, and instrument counts were correct at the end of the procedure according to the staff.     Duke Salvia, MD, [facility]     SCK/MEDQ  D:  04/01/2011  T:  04/02/2011  Job:  [id number]

## 2011-04-02 NOTE — Discharge Instructions (Signed)
   Supplemental Discharge Instructions for  Pacemaker/Defibrillator Patients  Activity No heavy lifting or vigorous activity with your left/right arm for 6 to 8 weeks.  Do not raise your left/right arm above your head for one week.  Gradually raise your affected arm as drawn below.                     3/16                 /        3/17              /          3/18            /        3/19            NO DRIVING for      ; you may begin driving on   1/61   . WOUND CARE   Keep the wound area clean and dry.  Do not get this area wet for one week. No showers for one week; you may shower on     3/19      .   The tape/steri-strips on your wound will fall off; do not pull them off.  No bandage is needed on the site.  DO  NOT apply any creams, oils, or ointments to the wound area.   If you notice any drainage or discharge from the wound, any swelling or bruising at the site, or you develop a fever > 101? F after you are discharged home, call the office at once.  Special Instructions   You are still able to use cellular telephones; use the ear opposite the side where you have your pacemaker/defibrillator.  Avoid carrying your cellular phone near your device.   When traveling through airports, show security personnel your identification card to avoid being screened in the metal detectors.  Ask the security personnel to use the hand wand.   Avoid arc welding equipment, MRI testing (magnetic resonance imaging), TENS units (transcutaneous nerve stimulators).  Call the office for questions about other devices.   Avoid electrical appliances that are in poor condition or are not properly grounded.   Microwave ovens are safe to be near or to operate.  Additional information for defibrillator patients should your device go off:   If your device goes off ONCE and you feel fine afterward, notify the device clinic nurses.   If your device goes off ONCE and you do not feel well afterward, call 911.   If your  device goes off TWICE, call 911.   If your device goes off THREE times in one day, call 911.  DO NOT DRIVE YOURSELF OR A FAMILY MEMBER WITH A DEFIBRILLATOR TO THE HOSPITAL--CALL 911.

## 2011-04-02 NOTE — Discharge Summary (Signed)
ELECTROPHYSIOLOGY PROCEDURE DISCHARGE SUMMARY    Patient ID: Caleb Bell,  MRN: [id number], DOB/AGE: 1955/06/21 56 y.o.  Admit date: 04/01/2011 Discharge date: 04/02/2011  Primary Care Physician: Caleb Ora, MD  Primary Cardiologist: Caleb Bonito, MD Electrophysiologist: Caleb Manges, MD  Primary Discharge Diagnosis:  Ischemic cardiomyopathy, IVCD, CHF status post cardiac resynchronization therapy defibrillator this admission  Secondary Discharge Diagnosis:   Hodgkin's disease  Dx aprox 2004, s/p XRT-Chemo  CAD (coronary artery disease)  CABG 2003  Hyperlipidemia  low HDL  DM type 2 (diabetes mellitus, type 2)  Hypothyroidism  Abuse  Alcohol, cocaine- resoloved for many years  Atrial fibrillation  Paroxysmal, limited  IVCD (intraventricular conduction defect)  CRI (chronic renal insufficiency) multifactorial... Dr. Allena Bell.. consult September 18, 2009  Hyperkalemia  August, 2011, Aldactone, Aldactone stopped  EFE (endocardial fibroelastosis)    Procedures This Admission:  1.  Implantation of a cardiac resynchronization therapy defibrillator on 04-01-2011 by Dr Caleb Bell.  The patient received a [employer] house [employer] CRTD model number [phone] with model number 1258T left ventricular lead, model number  7122 right ventricular lead, and model number 2088TC right atrial lead.  DFT's at time of implant were successful at 16J.  There were no immediate post procedure complications. 2.  Chest X-ray on 04-02-2011 demonstrated no pneumothorax status post device implantation with a possible small right pleural effusion.   Brief HPI: Mr. Caleb Bell is a 56 year old male with ischemic cardiomyopathy status post CABG, IVCD, and CHF with persistent decreased EF despite optimal medical therapy.  He was referred to Dr Caleb Bell in the outpatient setting for treatment options.  ICD implantation was recommended for primary prevention of sudden cardiac death.  Because of the patient's broad IVCD, left ventricular  lead placement was also recommended.  Risks, benefits, and alternatives were discussed with the patient and he wished to proceed.  Hospital Course:  The patient was admitted 04-01-2011 for planned implantation of a CRTD.  This was carried out by Dr Caleb Bell with details as outlined above.  He was monitored on telemetry overnight which demonstrated AV pacing. His left chest was without hematoma or ecchymosis.  CXR was obtained which demonstrated no pneumothorax status post device implantation.  Dr Caleb Bell examined the patient on 04-02-2011 and considered him stable for discharge with plans to follow up with Dr Myrtis Bell in 4-6 weeks for heart failure and a BMET.  He will see the device clinic in 10 days for a wound check and Dr Caleb Bell in 3 months for device follow up.   Discharge Vitals: Blood pressure 106/68, pulse 65, temperature 98.5 F (36.9 C), temperature source Oral, resp. rate 18, height 6\' 1"  (1.854 m), weight 200 lb (90.719 kg), SpO2 99.00%.     Discharge Medications:  Medication List  As of 04/02/2011 10:05 AM   TAKE these medications         aspirin 325 MG tablet   Take 325 mg by mouth daily.      benazepril 20 MG tablet   Commonly known as: LOTENSIN   Take 10 mg by mouth daily.      carvedilol 25 MG tablet   Commonly known as: COREG   Take 25 mg by mouth 2 (two) times daily with a meal.      digoxin 0.125 MG tablet   Commonly known as: LANOXIN   Take 125 mcg by mouth daily.      furosemide 40 MG tablet   Commonly known as: LASIX   Take 40  mg by mouth daily.      glimepiride 4 MG tablet   Commonly known as: AMARYL   Take 4 mg by mouth daily before breakfast.      levothyroxine 88 MCG tablet   Commonly known as: SYNTHROID, LEVOTHROID   Take 88 mcg by mouth daily.      simvastatin 40 MG tablet   Commonly known as: ZOCOR   Take 40 mg by mouth every evening.            Disposition:  Discharge Orders    Future Appointments: Provider: Department: Dept Phone: Center:    04/08/2011 8:00 AM Caleb Plump, MD [facility] [phone] [facility]   04/11/2011 11:30 AM [facility] [place] [phone] [facility]   05/15/2011 4:00 PM Caleb Abed, MD [facility] [phone] [facility]   07/16/2011 9:30 AM Caleb Salvia, MD [facility] [phone] [facility]     Follow-up Information    Follow up with Caleb Ora, MD on 04/08/2011. (8:00 AM)    Contact information:   [address] [facility] [address] [place] [address] [phone]       Follow up with [facility] on 04/11/2011. (11:30)    Contact information:   [address] [place] [phone]      Follow up with Caleb Rough, MD on 05/15/2011. (4:00 PM)    Contact information:   [address] [place] [address] [phone]       Follow up with Caleb Manges, MD on 07/16/2011. (9:30 AM)    Contact information:   [address], [address] [place] [address] [phone]          Duration of Discharge Encounter: Greater than 30 minutes including physician time.  Signed, Caleb Balsam, RN, BSN 04/02/2011, 10:05 AM

## 2011-04-02 NOTE — Progress Notes (Signed)
   ELECTROPHYSIOLOGY ROUNDING NOTE    Patient Name: Caleb Bell Date of Encounter: 04-02-2011    SUBJECTIVE:Feels well status post CRTD implant.  No chest pain or shortness of breath.  Some incisional soreness.   TELEMETRY: Reviewed telemetry pt AV paced Filed Vitals:   04/01/11 0554 04/01/11 1401 04/01/11 2119 04/02/11 0548  BP: 116/60 111/73 114/61 106/68  Pulse: 55 64 61 63  Temp: 97 F (36.1 C) 97.5 F (36.4 C) 98.1 F (36.7 C) 98.5 F (36.9 C)  TempSrc: Oral Oral Oral Oral  Resp: 18 20 18 18   Height: 6\' 1"  (1.854 m)     Weight: 200 lb (90.719 kg)     SpO2:  96% 96% 99%    Radiology/Studies:  Final result pending, leads in stable position.  PHYSICAL EXAM Left chest without hematoma or ecchymosis.   Well developed and nourished in no acute distress HENT normal Neck supple with JVP-flat Clear Regular rate and rhythm, no murmurs or gallops Abd-soft with active BS No Clubbing cyanosis edema Skin-warm and dry A & Oriented  Grossly normal sensory and motor function   EKG:  Bi-V pacing.  QRS   DEVICE INTERROGATION: Device interrogation pending.  Wound care, restrictions reviewed with patient.   F/U JK 4-6 weeks with bmet F/U sk 12 weeks

## 2011-04-08 ENCOUNTER — Ambulatory Visit (INDEPENDENT_AMBULATORY_CARE_PROVIDER_SITE_OTHER): Payer: PRIVATE HEALTH INSURANCE | Admitting: Internal Medicine

## 2011-04-08 VITALS — BP 120/70 | HR 61 | Temp 97.9°F | Wt 197.0 lb

## 2011-04-08 DIAGNOSIS — E785 Hyperlipidemia, unspecified: Secondary | ICD-10-CM

## 2011-04-08 DIAGNOSIS — E119 Type 2 diabetes mellitus without complications: Secondary | ICD-10-CM

## 2011-04-08 DIAGNOSIS — E039 Hypothyroidism, unspecified: Secondary | ICD-10-CM

## 2011-04-08 LAB — HEMOGLOBIN A1C: Hgb A1c MFr Bld: 7.8 % — ABNORMAL HIGH (ref 4.6–6.5)

## 2011-04-08 LAB — LIPID PANEL
Cholesterol: 177 mg/dL (ref 0–200)
HDL: 32.9 mg/dL — ABNORMAL LOW (ref >39.00)
Total CHOL/HDL Ratio: 5
Triglycerides: 386 mg/dL — ABNORMAL HIGH (ref 0.0–149.0)
VLDL: 77.2 mg/dL — ABNORMAL HIGH (ref 0.0–40.0)

## 2011-04-08 LAB — LDL CHOLESTEROL, DIRECT: Direct LDL: 101.2 mg/dL

## 2011-04-08 LAB — TSH: TSH: 2.76 u[IU]/mL (ref 0.35–5.50)

## 2011-04-08 NOTE — Assessment & Plan Note (Signed)
Diet has improved lately, on zocor 40 mg a day Labs

## 2011-04-08 NOTE — Assessment & Plan Note (Addendum)
Diabetes is poorly controlled, increased risk of cardiovascular event, blindness and worsening kidney function discussed. Has been reluctant to start insulin in the past 0r see endocrinology, states that he will do that if needed.  Plan, labs. Start insulin if needed. Endocrinology referral if needed.

## 2011-04-08 NOTE — Progress Notes (Signed)
  Subjective:    Patient ID: Caleb Bell, male    DOB: 04/10/1955, 56 y.o.   MRN: [id number]  HPI  Routine office visit Diabetes, was rec insulin based on the last A1c, he decided to stay on amaryl and  work on his diet.  Past medical history  DM  Neuropathy: foot ulcer 8-12  Hyperlipidemia, low HDL  Cardiovascular:  --EF 40% range / EF 20-25%...echo..06/14/2009, EF 30-35%, echo, January, 2012  --CAD----> CABG 2003  --Atrial fibrillation , paroxismal  --ICVD  -- Defibrillator implant 03-2011 Hypothyroidism  ABUSE, COCAINE, ALCOHOL, ETC ...resolved for many years  CRI:  --Creatinine... up to 1.5.Marland KitchenMarland KitchenMarland Kitchen 2011 / CKD 3.. multifactorial... Dr. Allena Katz.. consult August 22 2009  --Hyperkalemia 8- 2011, resolved after aldactone was d/c  HODGKIN'S DISEASE--Dx aprox 2004, s/p XRT-Chemo   [facility]  - defibrillator implant 03-2011  Review of Systems In general doing well, good compliance with cholesterol medication, diet improved. Renal insufficiency, sawDr. Allena Katz, plans is to see him from time to time. Ambulatory blood sugars 120-116 the mornings, after meals they go up to 220. Had a defibrillator implant a few days ago, besides soreness at the site of the implantation he is doing well    Objective:   Physical Exam  Alert oriented, no apparent distress. Lungs clear to auscultation bilaterally Cardiovascular, systolic murmur noted.      Assessment & Plan:

## 2011-04-08 NOTE — Assessment & Plan Note (Signed)
Due for labs

## 2011-04-09 ENCOUNTER — Encounter: Payer: Self-pay | Admitting: Internal Medicine

## 2011-04-11 ENCOUNTER — Encounter: Payer: Self-pay | Admitting: Internal Medicine

## 2011-04-11 ENCOUNTER — Ambulatory Visit (INDEPENDENT_AMBULATORY_CARE_PROVIDER_SITE_OTHER): Payer: PRIVATE HEALTH INSURANCE | Admitting: *Deleted

## 2011-04-11 DIAGNOSIS — I4891 Unspecified atrial fibrillation: Secondary | ICD-10-CM

## 2011-04-11 DIAGNOSIS — I428 Other cardiomyopathies: Secondary | ICD-10-CM

## 2011-04-11 LAB — ICD DEVICE OBSERVATION
AL AMPLITUDE: 4.7 mv
AL IMPEDENCE ICD: 362.5 Ohm
AL THRESHOLD: 1 V
ATRIAL PACING ICD: 85 pct
BAMS-0001: 180 {beats}/min
BAMS-0003: 70 {beats}/min
CHARGE TIME: 8.6 s
DEV-0020ICD: NEGATIVE
DEVICE MODEL ICD: 1042049
FVT: 0
HV IMPEDENCE: 50 Ohm
LV LEAD IMPEDENCE ICD: 525 Ohm
LV LEAD THRESHOLD: 0.5 V
MODE SWITCH EPISODES: 0
PACEART VT: 0
RV LEAD AMPLITUDE: 12 mv
RV LEAD IMPEDENCE ICD: 387.5 Ohm
RV LEAD THRESHOLD: 1.75 V
TOT-0006: 20130311000000
TOT-0007: 1
TOT-0008: 0
TOT-0009: 1
TOT-0010: 1
TZAT-0001SLOWVT: 1
TZAT-0004SLOWVT: 8
TZAT-0012SLOWVT: 200 ms
TZAT-0013SLOWVT: 2
TZAT-0018SLOWVT: NEGATIVE
TZAT-0019SLOWVT: 7.5 V
TZAT-0020SLOWVT: 1 ms
TZON-0003SLOWVT: 300 ms
TZON-0004SLOWVT: 30
TZON-0005SLOWVT: 6
TZON-0010SLOWVT: 40 ms
TZST-0001SLOWVT: 2
TZST-0001SLOWVT: 3
TZST-0001SLOWVT: 4
TZST-0001SLOWVT: 5
TZST-0003SLOWVT: 800 V
TZST-0003SLOWVT: 890 V
TZST-0003SLOWVT: 890 V
VENTRICULAR PACING ICD: 98 pct
VF: 0

## 2011-04-11 NOTE — Progress Notes (Signed)
Wound check defib  

## 2011-04-16 ENCOUNTER — Ambulatory Visit (INDEPENDENT_AMBULATORY_CARE_PROVIDER_SITE_OTHER): Payer: PRIVATE HEALTH INSURANCE | Admitting: Internal Medicine

## 2011-04-16 VITALS — BP 122/76 | HR 60 | Temp 97.4°F | Wt 198.0 lb

## 2011-04-16 DIAGNOSIS — N058 Unspecified nephritic syndrome with other morphologic changes: Secondary | ICD-10-CM

## 2011-04-16 DIAGNOSIS — E1122 Type 2 diabetes mellitus with diabetic chronic kidney disease: Secondary | ICD-10-CM

## 2011-04-16 DIAGNOSIS — E1129 Type 2 diabetes mellitus with other diabetic kidney complication: Secondary | ICD-10-CM

## 2011-04-16 DIAGNOSIS — E785 Hyperlipidemia, unspecified: Secondary | ICD-10-CM

## 2011-04-16 DIAGNOSIS — N189 Chronic kidney disease, unspecified: Secondary | ICD-10-CM

## 2011-04-16 MED ORDER — INSULIN GLARGINE 100 UNIT/ML ~~LOC~~ SOLN: SUBCUTANEOUS | Status: DC

## 2011-04-16 MED ORDER — INSULIN PEN NEEDLE 29G X 12MM MISC: Status: DC

## 2011-04-16 NOTE — Progress Notes (Signed)
  Subjective:    Patient ID: Caleb Bell, male    DOB: Jul 27, 1955, 56 y.o.   MRN: [id number]  HPI Here for management of diabetes and high cholesterol. Past medical history  DM  Neuropathy: foot ulcer 8-12  Hyperlipidemia, low HDL  Cardiovascular:  --EF 40% range / EF 20-25%...echo..06/14/2009, EF 30-35%, echo, January, 2012  --CAD----> CABG 2003  --Atrial fibrillation , paroxismal  --ICVD  -- Defibrillator implant 03-2011  Hypothyroidism  ABUSE, COCAINE, ALCOHOL, ETC ...resolved for many years  CRI:  --Creatinine... up to 1.5.Marland KitchenMarland KitchenMarland Kitchen 2011 / CKD 3.. multifactorial... Dr. Allena Katz.. consult August 22 2009  --Hyperkalemia 8- 2011, resolved after aldactone was d/c  HODGKIN'S DISEASE--Dx aprox 2004, s/p XRT-Chemo   [facility]  - defibrillator implant 03-2011   Review of Systems     Objective:   Physical Exam  Alert oriented x3, no apparent distress.      Assessment & Plan:  Today , I spent more than 20 min with the patient, >50% of the time counseling

## 2011-04-16 NOTE — Assessment & Plan Note (Signed)
Diabetic patient with renal insufficiency and heart disease, latest A1c 7.8. Goal A1c less than 7. Due to renal disease, we are avoiding several oral medications. At this point, I think his best option is Lantus. Patient in agreement, samples provided, reviewed with patient hypoglycemic symptoms. See instructions.

## 2011-04-16 NOTE — Assessment & Plan Note (Signed)
LDL is 101. LDL goal 70, not well controlled ; currently takes simvastatin 40 mg. Change to lipitor 40? Interacts w/ digoxin  Other statins? Cost is an issue Plan: Increase simvastatin to 60 mg

## 2011-04-16 NOTE — Patient Instructions (Signed)
Start Lantus 7 units every night, every 2 days, go up by 2 units until your morning sugars are around 110. Be aware of possible low sugar  (blood sugar less than 70) always carry some glucose with you. Call with readings in 10 days. ------ Increase simvastatin to 1.5  tablet

## 2011-04-17 ENCOUNTER — Encounter: Payer: Self-pay | Admitting: Internal Medicine

## 2011-05-15 ENCOUNTER — Encounter: Payer: PRIVATE HEALTH INSURANCE | Admitting: Cardiology

## 2011-05-17 ENCOUNTER — Encounter: Payer: Self-pay | Admitting: Cardiology

## 2011-05-17 DIAGNOSIS — Z95810 Presence of automatic (implantable) cardiac defibrillator: Secondary | ICD-10-CM | POA: Insufficient documentation

## 2011-05-19 ENCOUNTER — Other Ambulatory Visit: Payer: Self-pay | Admitting: Internal Medicine

## 2011-05-20 NOTE — Telephone Encounter (Signed)
Refill done.  

## 2011-05-21 ENCOUNTER — Ambulatory Visit (INDEPENDENT_AMBULATORY_CARE_PROVIDER_SITE_OTHER): Payer: PRIVATE HEALTH INSURANCE | Admitting: Cardiology

## 2011-05-21 ENCOUNTER — Encounter: Payer: Self-pay | Admitting: Cardiology

## 2011-05-21 VITALS — BP 118/64 | HR 65 | Ht 73.0 in | Wt 195.0 lb

## 2011-05-21 DIAGNOSIS — Z95810 Presence of automatic (implantable) cardiac defibrillator: Secondary | ICD-10-CM

## 2011-05-21 DIAGNOSIS — I509 Heart failure, unspecified: Secondary | ICD-10-CM

## 2011-05-21 DIAGNOSIS — I2510 Atherosclerotic heart disease of native coronary artery without angina pectoris: Secondary | ICD-10-CM

## 2011-05-21 DIAGNOSIS — E875 Hyperkalemia: Secondary | ICD-10-CM

## 2011-05-21 NOTE — Assessment & Plan Note (Signed)
Coronary disease is stable. No change in therapy. 

## 2011-05-21 NOTE — Patient Instructions (Signed)
Your physician wants you to follow-up in:  6 months. You will receive a reminder letter in the mail two months in advance. If you don't receive a letter, please call our office to schedule the follow-up appointment.   

## 2011-05-21 NOTE — Assessment & Plan Note (Signed)
He will followup with electrophysiology to see about his lead function. I certainly am hopeful that he will get full benefit of CRT therapy. At a later date I would like to proceed with a followup echo to see if he sat in the LV function improvement from his CRT but it is too early to consider this.

## 2011-05-21 NOTE — Assessment & Plan Note (Signed)
His potassium is stable. We cannot use Aldactone.

## 2011-05-21 NOTE — Progress Notes (Signed)
HPI Patient is seen back today to followup his cardiomyopathy. Since seeing him last he has had a CRTD device placed. He's doing well. He tells me today that at the time of an early interrogation that there may be a problem with some of the leads. He will be finding out more with a visit to electrophysiology next week. It was mentioned that he may have moved too much after the procedure. I encouraged him not to be concerned about this going forward as there is nothing that can be done about it at this point.  Patient's on appropriate medications for his LV. I had given him a trial of Aldactone. He had increased potassium in the Aldactone was stopped. Over time I will continue to review whether we should rechallenge him again in a very small dose.    No Known Allergies  Current Outpatient Prescriptions  Medication Sig Dispense Refill  . aspirin 325 MG tablet Take 325 mg by mouth daily.        . benazepril (LOTENSIN) 20 MG tablet Take 10 mg by mouth daily.      . carvedilol (COREG) 25 MG tablet Take 25 mg by mouth 2 (two) times daily with a meal.      . digoxin (LANOXIN) 0.125 MG tablet TAKE ONE TABLET BY MOUTH ONE TIME DAILY  30 tablet  6  . furosemide (LASIX) 40 MG tablet Take 40 mg by mouth daily.      . insulin glargine (LANTUS SOLOSTAR) 100 UNIT/ML injection 7 units at bedtime , titrate up as needed  5 pen  1  . Insulin Pen Needle 29G X MISC Pen needles of pt's choice  for Lantus Solostar  100 each  1  . levothyroxine (SYNTHROID, LEVOTHROID) 88 MCG tablet Take 88 mcg by mouth daily.      . simvastatin (ZOCOR) 40 MG tablet Take 60 mg by mouth every evening.       Marland Kitchen DISCONTD: benazepril (LOTENSIN) 20 MG tablet TAKE 1/2 TABLET BY MOUTH EVERY DAY  15 tablet  6  . DISCONTD: glimepiride (AMARYL) 4 MG tablet 1.5 tablet a day  30 tablet  11    History   Social History  . Marital Status: Single    Spouse Name: N/A    Number of Children: 0  . Years of Education: N/A   Occupational  History  . musician    Social History Main Topics  . Smoking status: Never Smoker   . Smokeless tobacco: Never Used  . Alcohol Use: No     Hx of abuse/clean since 2003   . Drug Use: No     Hx of abuse/clean for many years  . Sexually Active: Not on file   Other Topics Concern  . 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 Adopted: Yes    Past Medical History  Diagnosis Date  . Hodgkin's disease      Dx aprox 2004, s/p XRT-Chemo  . CAD (coronary artery disease)     CABG 2003  . Hyperlipidemia     low HDL  . DM type 2 (diabetes mellitus, type 2)   . Hypothyroidism   . Polysubstance     Alcohol, cocaine- resoloved for many years   . Atrial fibrillation     Paroxysmal, limited  . IVCD (  intraventricular conduction defect)   . CRI (chronic renal insufficiency)      multifactorial... Dr. Allena Isami Mehra.. consult September 18, 2009  . Hyperkalemia     August, 2011, Aldactone, Aldactone stopped  . Chronic systolic heart failure     EF 16-10% echo 5- 2011  . Ischemic cardiomyopathy     MRI12/12 no real viability in hypo/akinetic segment  CAth native and graft disease  . Elevated serum creatinine   . ICD (implantable cardiac defibrillator) in place     CRT-D placed March, 2013    Past Surgical History  Procedure Date  . Tonsillectomy   . Coronary artery bypass graft   . Cystectomy     Upper Back    ROS   Patient denies fever, chills, headache, sweats, rash, change in vision, change in hearing, chest pain, cough, nausea vomiting, urinary symptoms. All other systems are reviewed and are negative.  PHYSICAL EXAM  Patient is oriented to person time and place. Affect is normal. There is no jugulovenous distention. Lungs are clear. Respiratory effort is nonlabored. Cardiac exam reveals S1 and S2. There no clicks  or significant murmurs. The ICD site is nicely healed. The abdomen is soft. There is no peripheral edema.  Filed Vitals:   05/21/11 0908  BP: 118/64  Pulse: 65  Height: 6\' 1"  (1.854 m)  Weight: 195 lb (88.451 kg)     ASSESSMENT & PLAN

## 2011-05-21 NOTE — Assessment & Plan Note (Signed)
Clinically the patient is doing well. He has no overt signs of heart failure. His medicines have been adjusted appropriately. I cannot use Aldactone because of a history of hyperkalemia from Aldactone in the past. No change in his meds at this time.

## 2011-05-27 ENCOUNTER — Ambulatory Visit (INDEPENDENT_AMBULATORY_CARE_PROVIDER_SITE_OTHER): Payer: PRIVATE HEALTH INSURANCE | Admitting: *Deleted

## 2011-05-27 ENCOUNTER — Encounter: Payer: Self-pay | Admitting: Internal Medicine

## 2011-05-27 DIAGNOSIS — I428 Other cardiomyopathies: Secondary | ICD-10-CM

## 2011-05-27 LAB — ICD DEVICE OBSERVATION
BAMS-0001: 180 {beats}/min
BAMS-0003: 70 {beats}/min
DEV-0020ICD: NEGATIVE
DEVICE MODEL ICD: 1042049
TZAT-0001SLOWVT: 1
TZAT-0004SLOWVT: 8
TZAT-0012SLOWVT: 200 ms
TZAT-0013SLOWVT: 2
TZAT-0018SLOWVT: NEGATIVE
TZAT-0019SLOWVT: 7.5 V
TZAT-0020SLOWVT: 1 ms
TZON-0003SLOWVT: 300 ms
TZON-0004SLOWVT: 30
TZON-0005SLOWVT: 6
TZON-0010SLOWVT: 40 ms
TZST-0001SLOWVT: 2
TZST-0001SLOWVT: 3
TZST-0001SLOWVT: 4
TZST-0001SLOWVT: 5
TZST-0003SLOWVT: 800 V
TZST-0003SLOWVT: 890 V
TZST-0003SLOWVT: 890 V

## 2011-05-27 NOTE — Progress Notes (Signed)
icd check in clinic for RV lead recheck

## 2011-07-05 ENCOUNTER — Encounter: Payer: Self-pay | Admitting: *Deleted

## 2011-07-09 ENCOUNTER — Ambulatory Visit (INDEPENDENT_AMBULATORY_CARE_PROVIDER_SITE_OTHER): Payer: PRIVATE HEALTH INSURANCE | Admitting: Internal Medicine

## 2011-07-09 VITALS — BP 112/70 | HR 60 | Temp 97.6°F | Wt 192.0 lb

## 2011-07-09 DIAGNOSIS — E1122 Type 2 diabetes mellitus with diabetic chronic kidney disease: Secondary | ICD-10-CM

## 2011-07-09 DIAGNOSIS — E785 Hyperlipidemia, unspecified: Secondary | ICD-10-CM

## 2011-07-09 DIAGNOSIS — E1129 Type 2 diabetes mellitus with other diabetic kidney complication: Secondary | ICD-10-CM

## 2011-07-09 DIAGNOSIS — N058 Unspecified nephritic syndrome with other morphologic changes: Secondary | ICD-10-CM

## 2011-07-09 DIAGNOSIS — N189 Chronic kidney disease, unspecified: Secondary | ICD-10-CM

## 2011-07-09 DIAGNOSIS — N259 Disorder resulting from impaired renal tubular function, unspecified: Secondary | ICD-10-CM

## 2011-07-09 LAB — BASIC METABOLIC PANEL
BUN: 31 mg/dL — ABNORMAL HIGH (ref 6–23)
CO2: 27 mEq/L (ref 19–32)
Calcium: 8.8 mg/dL (ref 8.4–10.5)
Chloride: 105 mEq/L (ref 96–112)
Creatinine, Ser: 1.4 mg/dL (ref 0.4–1.5)
GFR: 55.08 mL/min — ABNORMAL LOW (ref >60.00)
Glucose, Bld: 136 mg/dL — ABNORMAL HIGH (ref 70–99)
Potassium: 4.1 mEq/L (ref 3.5–5.1)
Sodium: 140 mEq/L (ref 135–145)

## 2011-07-09 LAB — AST: AST: 21 U/L (ref 0–37)

## 2011-07-09 LAB — ALT: ALT: 26 U/L (ref 0–53)

## 2011-07-09 LAB — HEMOGLOBIN A1C: Hgb A1c MFr Bld: 8 % — ABNORMAL HIGH (ref 4.6–6.5)

## 2011-07-09 LAB — LIPID PANEL
Cholesterol: 158 mg/dL (ref 0–200)
HDL: 33.6 mg/dL — ABNORMAL LOW (ref >39.00)
Total CHOL/HDL Ratio: 5
Triglycerides: 347 mg/dL — ABNORMAL HIGH (ref 0.0–149.0)
VLDL: 69.4 mg/dL — ABNORMAL HIGH (ref 0.0–40.0)

## 2011-07-09 LAB — LDL CHOLESTEROL, DIRECT: Direct LDL: 77.7 mg/dL

## 2011-07-09 MED ORDER — SIMVASTATIN 40 MG PO TABS: 60.0000 mg | ORAL_TABLET | Freq: Every evening | ORAL | Status: DC

## 2011-07-09 NOTE — Assessment & Plan Note (Signed)
Check a BMP

## 2011-07-09 NOTE — Assessment & Plan Note (Addendum)
Currently on 37 units of Lantus, morning CBGs varies, they can CBGs as high as 255. Plan: A1c Suspect the patient will need rapid acting insulin and a more complex insulin regimen. Consider endocrinology referral

## 2011-07-09 NOTE — Progress Notes (Signed)
  Subjective:    Patient ID: Caleb Bell, male    DOB: [birth year], 56 y.o.   MRN: [id number]  HPI Routine followup Since the last office visit we increase simvastatin, good compliance. Started Lantus, dose increased from 7 to currently 37. Morning blood sugar varies from 115 to 207 but in general < 150. During the daytime, CBGs are generally more than 150 with occasional to 255. Diet is okay but there is days that he overeats  Past medical history  DM  Neuropathy: foot ulcer 8-12  Hyperlipidemia, low HDL  Cardiovascular:  --EF 40% range / EF 20-25%...echo..06/14/2009, EF 30-35%, echo, January, 2012  --CAD----> CABG 2003  --Atrial fibrillation , paroxismal  --ICVD  -- Defibrillator implant 03-2011  Hypothyroidism  ABUSE, COCAINE, ALCOHOL, ETC ...resolved for many years  CRI:  --Creatinine... up to 1.5.Marland KitchenMarland KitchenMarland Kitchen 2011 / CKD 3.. multifactorial... Dr. Allena Katz.. consult August 22 2009  --Hyperkalemia 8- 2011, resolved after aldactone was d/c  HODGKIN'S DISEASE--Dx aprox 2004, s/p XRT-Chemo   [facility]  - defibrillator implant 03-2011   Review of Systems No chest or shortness of breath No nausea, vomiting, diarrhea. No myalgias No low blood sugar type of symptoms      Objective:   Physical Exam General -- alert, well-developed, and well-nourished.  Lungs -- normal respiratory effort, no intercostal retractions, no accessory muscle use, and normal breath sounds.   Heart-- normal rate, regular rhythm, no murmur, and no gallop.   Extremities-- no pretibial edema bilaterally Psych-- Cognition and judgment appear intact. Alert and cooperative with normal attention span and concentration.  not anxious appearing and not depressed appearing.      Assessment & Plan:

## 2011-07-09 NOTE — Assessment & Plan Note (Signed)
Tolerates well higher simvastatin dose

## 2011-07-10 ENCOUNTER — Encounter: Payer: Self-pay | Admitting: Internal Medicine

## 2011-07-11 NOTE — Addendum Note (Signed)
Addended by: Edwena Felty T on: 07/11/2011 02:35 PM   Modules accepted: Orders

## 2011-07-16 ENCOUNTER — Ambulatory Visit (INDEPENDENT_AMBULATORY_CARE_PROVIDER_SITE_OTHER): Payer: PRIVATE HEALTH INSURANCE | Admitting: Internal Medicine

## 2011-07-16 ENCOUNTER — Encounter: Payer: Self-pay | Admitting: Internal Medicine

## 2011-07-16 VITALS — BP 98/65 | HR 68 | Ht 74.0 in | Wt 193.0 lb

## 2011-07-16 DIAGNOSIS — I2510 Atherosclerotic heart disease of native coronary artery without angina pectoris: Secondary | ICD-10-CM

## 2011-07-16 DIAGNOSIS — Z95810 Presence of automatic (implantable) cardiac defibrillator: Secondary | ICD-10-CM

## 2011-07-16 DIAGNOSIS — I4891 Unspecified atrial fibrillation: Secondary | ICD-10-CM

## 2011-07-16 DIAGNOSIS — I2589 Other forms of chronic ischemic heart disease: Secondary | ICD-10-CM

## 2011-07-16 DIAGNOSIS — I509 Heart failure, unspecified: Secondary | ICD-10-CM

## 2011-07-16 LAB — ICD DEVICE OBSERVATION
AL AMPLITUDE: 4.2 mv
AL IMPEDENCE ICD: 375 Ohm
AL THRESHOLD: 1 V
ATRIAL PACING ICD: 84 pct
BAMS-0001: 180 {beats}/min
BAMS-0003: 70 {beats}/min
DEV-0020ICD: NEGATIVE
DEVICE MODEL ICD: 1042049
FVT: 0
HV IMPEDENCE: 61 Ohm
LV LEAD IMPEDENCE ICD: 475 Ohm
LV LEAD THRESHOLD: 0.5 V
MODE SWITCH EPISODES: 0
PACEART VT: 0
RV LEAD AMPLITUDE: 12 mv
RV LEAD IMPEDENCE ICD: 462.5 Ohm
RV LEAD THRESHOLD: 1.25 V
TOT-0006: 20130311000000
TOT-0007: 1
TOT-0008: 0
TOT-0009: 1
TOT-0010: 1
TZAT-0001SLOWVT: 1
TZAT-0004SLOWVT: 8
TZAT-0012SLOWVT: 200 ms
TZAT-0013SLOWVT: 2
TZAT-0018SLOWVT: NEGATIVE
TZAT-0019SLOWVT: 7.5 V
TZAT-0020SLOWVT: 1 ms
TZON-0003SLOWVT: 300 ms
TZON-0004SLOWVT: 30
TZON-0005SLOWVT: 6
TZON-0010SLOWVT: 40 ms
TZST-0001SLOWVT: 2
TZST-0001SLOWVT: 3
TZST-0001SLOWVT: 4
TZST-0001SLOWVT: 5
TZST-0003SLOWVT: 800 V
TZST-0003SLOWVT: 890 V
TZST-0003SLOWVT: 890 V
VENTRICULAR PACING ICD: 97 pct
VF: 0

## 2011-07-16 NOTE — Patient Instructions (Addendum)
Your physician recommends that you schedule a follow-up appointment in: 3 months with Dr. Graciela Husbands.  Your physician wants you to follow-up in: 1 year with Dr. Graciela Husbands. You will receive a reminder letter in the mail two months in advance. If you don't receive a letter, please call our office to schedule the follow-up appointment.  Your physician has recommended you make the following change in your medication:  1) Decrease aspirin to 81 mg once daily.

## 2011-07-16 NOTE — Assessment & Plan Note (Signed)
Stable and somewhat improved following CRT. I need to get clarification of  in terms of   timing intervals  to see if anything can be/should be done

## 2011-07-16 NOTE — Progress Notes (Signed)
HPI  Caleb Bell is a 56 y.o. male Seen in followup for ischemic cardiomyopathy congestive heart failure and an IVCD with a broad QRS status post CRT-D implantation (March 2013).  He also has a history of paroxysmal atrial fibrillation and renal insufficiency. His history of Hodgkin's disease and is status post chemotherapy and radiation therapy  The patient denies chest pain, shortness of breath, nocturnal dyspnea, orthopnea or peripheral edema.  There have been no palpitations, lightheadedness or syncope.    He is generally better  Past Medical History  Diagnosis Date  . Hodgkin's disease      Dx aprox 2004, s/p XRT-Chemo  . CAD (coronary artery disease)     CABG 2003  . Hyperlipidemia     low HDL  . DM type 2 (diabetes mellitus, type 2)   . Hypothyroidism   . Polysubstance     Alcohol, cocaine- resoloved for many years   . Atrial fibrillation     Paroxysmal, limited  . IVCD (intraventricular conduction defect)   . CRI (chronic renal insufficiency)      multifactorial... Dr. Allena Katz.. consult September 18, 2009  . Hyperkalemia     August, 2011, Aldactone, Aldactone stopped  . Chronic systolic heart failure     EF 96-04% echo 5- 2011  . Ischemic cardiomyopathy     MRI12/12 no real viability in hypo/akinetic segment  CAth native and graft disease  . Elevated serum creatinine   . ICD (implantable cardiac defibrillator) in place     CRT-D placed March, 2013    Past Surgical History  Procedure Date  . Tonsillectomy   . Coronary artery bypass graft   . Cystectomy     Upper Back    Current Outpatient Prescriptions  Medication Sig Dispense Refill  . aspirin 325 MG tablet Take 325 mg by mouth daily.        . benazepril (LOTENSIN) 20 MG tablet Take 10 mg by mouth daily.      . carvedilol (COREG) 25 MG tablet Take 25 mg by mouth 2 (two) times daily with a meal.      . digoxin (LANOXIN) 0.125 MG tablet TAKE ONE TABLET BY MOUTH ONE TIME DAILY  30 tablet  6  . furosemide  (LASIX) 40 MG tablet Take 40 mg by mouth daily.      . insulin glargine (LANTUS) 100 UNIT/ML injection 37 units at bedtime , titrate up as needed      . Insulin Pen Needle 29G X MISC Pen needles of pt's choice  for Lantus Solostar  100 each  1  . levothyroxine (SYNTHROID, LEVOTHROID) 88 MCG tablet Take 88 mcg by mouth daily.      . simvastatin (ZOCOR) 40 MG tablet Take 1.5 tablets (60 mg total) by mouth every evening.  60 tablet  3  . DISCONTD: glimepiride (AMARYL) 4 MG tablet 1.5 tablet a day  30 tablet  11    No Known Allergies  Review of Systems negative except from HPI and PMH  Physical Exam BP 98/65  Pulse 68  Ht 6\' 2"  (1.88 m)  Wt 193 lb (87.544 kg)  BMI 24.78 kg/m2 Well developed and well nourished in no acute distress HENT normal E scleral and icterus clear Neck Supple JVP flat; carotids brisk and full Clear to ausculation Pocket well-healed I ular rate and rhythm, no murmurs gallops or rub Soft with active bowel sounds No clubbing cyanosis none Edema Alert and oriented, grossly normal motor and sensory function Skin  Warm and Dry    Assessment and  Plan

## 2011-07-16 NOTE — Assessment & Plan Note (Signed)
The patient's device was interrogated and the information was fully reviewed.  The device was reprogrammed to maximize longevitymai

## 2011-07-16 NOTE — Assessment & Plan Note (Signed)
No intercurrent afib 

## 2011-07-16 NOTE — Assessment & Plan Note (Signed)
As above.

## 2011-07-16 NOTE — Assessment & Plan Note (Signed)
Stable. We will decrease his aspirin from 325-81

## 2011-07-23 ENCOUNTER — Other Ambulatory Visit: Payer: Self-pay | Admitting: Internal Medicine

## 2011-07-23 NOTE — Telephone Encounter (Signed)
Refill done.  

## 2011-08-01 ENCOUNTER — Ambulatory Visit: Payer: PRIVATE HEALTH INSURANCE | Admitting: Endocrinology

## 2011-08-12 ENCOUNTER — Other Ambulatory Visit: Payer: Self-pay | Admitting: Internal Medicine

## 2011-08-12 NOTE — Telephone Encounter (Signed)
Refill done.  

## 2011-08-13 ENCOUNTER — Encounter: Payer: Self-pay | Admitting: Endocrinology

## 2011-08-13 ENCOUNTER — Ambulatory Visit (INDEPENDENT_AMBULATORY_CARE_PROVIDER_SITE_OTHER): Payer: PRIVATE HEALTH INSURANCE | Admitting: Endocrinology

## 2011-08-13 VITALS — BP 110/72 | HR 105 | Temp 97.0°F | Ht 73.0 in | Wt 189.0 lb

## 2011-08-13 DIAGNOSIS — E1122 Type 2 diabetes mellitus with diabetic chronic kidney disease: Secondary | ICD-10-CM

## 2011-08-13 DIAGNOSIS — N058 Unspecified nephritic syndrome with other morphologic changes: Secondary | ICD-10-CM

## 2011-08-13 DIAGNOSIS — E1129 Type 2 diabetes mellitus with other diabetic kidney complication: Secondary | ICD-10-CM

## 2011-08-13 DIAGNOSIS — N189 Chronic kidney disease, unspecified: Secondary | ICD-10-CM

## 2011-08-13 NOTE — Progress Notes (Signed)
Subjective:    Patient ID: Caleb Bell, male    DOB: 28-Sep-1955, 56 y.o.   MRN: [id number]  HPI pt states 10 years h/o dm. it is complicated by renal insuff, peripheral sensory neuropathy, and CAD.  he has been on insulin x a few mos.  pt says his diet is "ok," and exercise is very good.  Pt states few years of intermittent moderate dizziness sensation in the head, but no assoc LOC. Past Medical History  Diagnosis Date  . Hodgkin's disease      Dx aprox 2004, s/p XRT-Chemo  . CAD (coronary artery disease)     CABG 2003  . Hyperlipidemia     low HDL  . DM type 2 (diabetes mellitus, type 2)   . Hypothyroidism   . Polysubstance     Alcohol, cocaine- resoloved for many years   . Atrial fibrillation     Paroxysmal, limited  . IVCD (intraventricular conduction defect)   . CRI (chronic renal insufficiency)      multifactorial... Dr. Allena Katz.. consult September 18, 2009  . Hyperkalemia     August, 2011, Aldactone, Aldactone stopped  . Chronic systolic heart failure     EF 78-29% echo 5- 2011  . Ischemic cardiomyopathy     MRI12/12 no real viability in hypo/akinetic segment  CAth native and graft disease  . Elevated serum creatinine   . ICD (implantable cardiac defibrillator) in place     CRT-D placed March, 2013    Past Surgical History  Procedure Date  . Tonsillectomy   . Coronary artery bypass graft   . Cystectomy     Upper Back    History   Social History  . Marital Status: Single    Spouse Name: N/A    Number of Children: 0  . Years of Education: N/A   Occupational History  . musician    Social History Main Topics  . Smoking status: Never Smoker   . Smokeless tobacco: Never Used  . Alcohol Use: No     Hx of abuse/clean since 2003   . Drug Use: No     Hx of abuse/clean for many years  . Sexually Active: Not on file   Other Topics Concern  . Not on file   Social History Narrative   Regular Exercise-- yes (walking daily) --- diet: needs improvement  MusicianSingle, no childrenPatient Adopted--no family Hx    Current Outpatient Prescriptions on File Prior to Visit  Medication Sig Dispense Refill  . aspirin EC 81 MG tablet Take 1 tablet (81 mg total) by mouth daily.      . benazepril (LOTENSIN) 20 MG tablet Take 10 mg by mouth daily.      . carvedilol (COREG) 25 MG tablet Take 25 mg by mouth 2 (two) times daily with a meal.      . digoxin (LANOXIN) 0.125 MG tablet TAKE ONE TABLET BY MOUTH ONE TIME DAILY  30 tablet  6  . furosemide (LASIX) 40 MG tablet TAKE ONE TABLET BY MOUTH ONE TIME DAILY  30 tablet  5  . insulin glargine (LANTUS SOLOSTAR) 100 UNIT/ML injection Inject 37 units at bedtime, titrate up as needed.  15 mL  5  . Insulin Pen Needle 29G X MISC Pen needles of pt's choice  for Lantus Solostar  100 each  1  . levothyroxine (SYNTHROID, LEVOTHROID) 88 MCG tablet TAKE 1 TABLET BY MOUTH DAILY. NEEDS OFFICE VISIT.  30 tablet  5  . simvastatin (ZOCOR) 40  MG tablet Take 1.5 tablets (60 mg total) by mouth every evening.  60 tablet  3  . DISCONTD: furosemide (LASIX) 40 MG tablet Take 40 mg by mouth daily.      Marland Kitchen DISCONTD: glimepiride (AMARYL) 4 MG tablet 1.5 tablet a day  30 tablet  11  . DISCONTD: insulin glargine (LANTUS) 100 UNIT/ML injection 37 units at bedtime , titrate up as needed      . DISCONTD: levothyroxine (SYNTHROID, LEVOTHROID) 88 MCG tablet Take 88 mcg by mouth daily.        No Known Allergies  Family History  Problem Relation Age of Onset  . Adopted: Yes   BP 110/72  Pulse 105  Temp 97 F (36.1 C) (Oral)  Ht 6\' 1"  (1.854 m)  Wt 189 lb (85.73 kg)  BMI 24.94 kg/m2  SpO2 99%  Review of Systems denies blurry vision, chest pain, sob, n/v, cramps, excessive diaphoresis, memory loss, depression, rhinorrhea, and easy bruising.  He has lost weight, due to his efforts.  He has intermittent headache and urinary frequency. He seldom has hypoglycemia, and these episodes are mild.    Objective:   Physical Exam VS: see  vs page GEN: no distress HEAD: head: no deformity eyes: no periorbital swelling, no proptosis external nose and ears are normal mouth: no lesion seen NECK: supple, thyroid is not enlarged CHEST WALL: no deformity LUNGS: clear to auscultation BREASTS:  No gynecomastia CV: reg rate and rhythm, no murmur ABD: abdomen is soft, nontender.  no hepatosplenomegaly.  not distended.  no hernia MUSCULOSKELETAL: muscle bulk and strength are grossly normal.  no obvious joint swelling.  gait is normal and steady EXTEMITIES: no deformity.  no ulcer on the feet.  feet are of normal color and temp.  no edema.  Old healed surgical scar (vein harvest) at the right leg. PULSES: dorsalis pedis intact bilat.  no carotid bruit NEURO:  cn 2-12 grossly intact.   readily moves all 4's.  sensation is intact to touch on the feet, but decreased from normal. SKIN:  Normal texture and temperature.  No rash or suspicious lesion is visible.   NODES:  None palpable at the neck PSYCH: alert, oriented x3.  Does not appear anxious nor depressed.  Lab Results  Component Value Date   HGBA1C 8.0* 07/09/2011      Assessment & Plan:  DM.  needs increased rx.  The pattern of cbg's indicates she needs insulin mostly during the day.  She may also need lantus as well. Dizziness, uncertain etiology Renal dz, prob caused or exac by DM

## 2011-08-13 NOTE — Patient Instructions (Addendum)
good diet and exercise habits significanly improve the control of your diabetes.  please let me know if you wish to be referred to a dietician.  high blood sugar is very risky to your health.  you should see an eye doctor every year. controlling your blood pressure and cholesterol drastically reduces the damage diabetes does to your body.  this also applies to quitting smoking.  please discuss these with your doctor.  you should take an aspirin every day, unless you have been advised by a doctor not to. check your blood sugar 3 times a day.  vary the time of day when you check, between before the 3 meals, and at bedtime.  also check if you have symptoms of your blood sugar being too high or too low.  please keep a record of the readings and bring it to your next appointment here.  please call us sooner if your blood sugar goes below 70, or if it stays over 200. Change lantus to humalog 3x a day (just before each meal) 12-27-10 units.   Please come back for a follow-up appointment in 2 weeks.

## 2011-08-28 ENCOUNTER — Encounter: Payer: Self-pay | Admitting: Endocrinology

## 2011-08-28 ENCOUNTER — Ambulatory Visit (INDEPENDENT_AMBULATORY_CARE_PROVIDER_SITE_OTHER): Payer: PRIVATE HEALTH INSURANCE | Admitting: Endocrinology

## 2011-08-28 VITALS — BP 112/70 | HR 92 | Temp 97.1°F | Wt 195.0 lb

## 2011-08-28 DIAGNOSIS — N189 Chronic kidney disease, unspecified: Secondary | ICD-10-CM

## 2011-08-28 DIAGNOSIS — E1122 Type 2 diabetes mellitus with diabetic chronic kidney disease: Secondary | ICD-10-CM

## 2011-08-28 DIAGNOSIS — E1129 Type 2 diabetes mellitus with other diabetic kidney complication: Secondary | ICD-10-CM

## 2011-08-28 DIAGNOSIS — N058 Unspecified nephritic syndrome with other morphologic changes: Secondary | ICD-10-CM

## 2011-08-28 MED ORDER — INSULIN LISPRO 100 UNIT/ML ~~LOC~~ SOLN: SUBCUTANEOUS | Status: DC

## 2011-08-28 MED ORDER — GLUCOSE BLOOD VI STRP: 1.0000 | ORAL_STRIP | Freq: Two times a day (BID) | Status: DC

## 2011-08-28 NOTE — Patient Instructions (Addendum)
check your blood sugar 3 times a day.  vary the time of day when you check, between before the 3 meals, and at bedtime.  also check if you have symptoms of your blood sugar being too high or too low.  please keep a record of the readings and bring it to your next appointment here.  please call us sooner if your blood sugar goes below 70, or if it stays over 200. Increase humalog to 3x a day (just before each meal) 13-7-12 units.   Please come back for a follow-up appointment in 2-3 months.

## 2011-08-28 NOTE — Progress Notes (Signed)
Subjective:    Patient ID: Caleb Bell, male    DOB: 1956/01/04, 56 y.o.   MRN: [id number]  HPI pt returns for f/u of insulin-requiring DM (dx'ed 2003; complicated by renal insuff, peripheral sensory neuropathy, and CAD).  pt states he feels well in general.  he brings a record of his cbg's which i have reviewed today.  It varies from 86-200, but most are in the mid-100's.  There is no trend throughout the day.   Past Medical History  Diagnosis Date  . Hodgkin's disease      Dx aprox 2004, s/p XRT-Chemo  . CAD (coronary artery disease)     CABG 2003  . Hyperlipidemia     low HDL  . DM type 2 (diabetes mellitus, type 2)   . Hypothyroidism   . Polysubstance     Alcohol, cocaine- resoloved for many years   . Atrial fibrillation     Paroxysmal, limited  . IVCD (intraventricular conduction defect)   . CRI (chronic renal insufficiency)      multifactorial... Dr. Allena Katz.. consult September 18, 2009  . Hyperkalemia     August, 2011, Aldactone, Aldactone stopped  . Chronic systolic heart failure     EF 40-98% echo 5- 2011  . Ischemic cardiomyopathy     MRI12/12 no real viability in hypo/akinetic segment  CAth native and graft disease  . Elevated serum creatinine   . ICD (implantable cardiac defibrillator) in place     CRT-D placed March, 2013    Past Surgical History  Procedure Date  . Tonsillectomy   . Coronary artery bypass graft   . Cystectomy     Upper Back    History   Social History  . Marital Status: Single    Spouse Name: N/A    Number of Children: 0  . Years of Education: N/A   Occupational History  . musician    Social History Main Topics  . Smoking status: Never Smoker   . Smokeless tobacco: Never Used  . Alcohol Use: No     Hx of abuse/clean since 2003   . Drug Use: No     Hx of abuse/clean for many years  . Sexually Active: Not on file   Other Topics Concern  . Not on file   Social History Narrative   Regular Exercise-- yes (walking daily) ---  diet: needs improvement MusicianSingle, no childrenPatient Adopted--no family Hx    Current Outpatient Prescriptions on File Prior to Visit  Medication Sig Dispense Refill  . aspirin EC 81 MG tablet Take 1 tablet (81 mg total) by mouth daily.      . benazepril (LOTENSIN) 20 MG tablet Take 10 mg by mouth daily.      . carvedilol (COREG) 25 MG tablet Take 25 mg by mouth 2 (two) times daily with a meal.      . digoxin (LANOXIN) 0.125 MG tablet TAKE ONE TABLET BY MOUTH ONE TIME DAILY  30 tablet  6  . furosemide (LASIX) 40 MG tablet TAKE ONE TABLET BY MOUTH ONE TIME DAILY  30 tablet  5  . Insulin Pen Needle 29G X MISC Pen needles of pt's choice  for Lantus Solostar  100 each  1  . levothyroxine (SYNTHROID, LEVOTHROID) 88 MCG tablet TAKE 1 TABLET BY MOUTH DAILY. NEEDS OFFICE VISIT.  30 tablet  5  . DISCONTD: insulin lispro (HUMALOG KWIKPEN) 100 UNIT/ML injection 3x a day (just before each meal) 13-7-12 units.      Marland Kitchen  simvastatin (ZOCOR) 40 MG tablet Take 1.5 tablets (60 mg total) by mouth every evening.  60 tablet  3  . DISCONTD: glimepiride (AMARYL) 4 MG tablet 1.5 tablet a day  30 tablet  11    No Known Allergies  Family History  Problem Relation Age of Onset  . Adopted: Yes    BP 112/70  Pulse 92  Temp 97.1 F (36.2 C) (Oral)  Wt 195 lb (88.451 kg)  SpO2 98%  Review of Systems denies hypoglycemia    Objective:   Physical Exam VITAL SIGNS:  See vs page GENERAL: no distress SKIN:  Insulin injection sites at the anterior abdomen are normal    Assessment & Plan:  DM.  needs increased rx

## 2011-09-21 ENCOUNTER — Other Ambulatory Visit: Payer: Self-pay | Admitting: Cardiology

## 2011-10-11 ENCOUNTER — Other Ambulatory Visit: Payer: Self-pay | Admitting: *Deleted

## 2011-10-11 ENCOUNTER — Other Ambulatory Visit: Payer: Self-pay | Admitting: Internal Medicine

## 2011-10-11 MED ORDER — LEVOTHYROXINE SODIUM 88 MCG PO TABS: 88.0000 ug | ORAL_TABLET | Freq: Every day | ORAL | Status: DC

## 2011-10-11 MED ORDER — DIGOXIN 125 MCG PO TABS: 0.1250 mg | ORAL_TABLET | Freq: Every day | ORAL | Status: DC

## 2011-10-11 MED ORDER — FUROSEMIDE 40 MG PO TABS: 40.0000 mg | ORAL_TABLET | Freq: Every day | ORAL | Status: DC

## 2011-10-11 MED ORDER — CARVEDILOL 25 MG PO TABS: 25.0000 mg | ORAL_TABLET | Freq: Two times a day (BID) | ORAL | Status: DC

## 2011-10-11 MED ORDER — SIMVASTATIN 40 MG PO TABS: 60.0000 mg | ORAL_TABLET | Freq: Every evening | ORAL | Status: DC

## 2011-10-11 NOTE — Telephone Encounter (Signed)
Refill done-digoxin 0.125mg , simvastatin 40mg , furosemide 40mg , levthyroxine .

## 2011-10-24 ENCOUNTER — Ambulatory Visit (INDEPENDENT_AMBULATORY_CARE_PROVIDER_SITE_OTHER): Payer: PRIVATE HEALTH INSURANCE | Admitting: *Deleted

## 2011-10-24 ENCOUNTER — Ambulatory Visit (INDEPENDENT_AMBULATORY_CARE_PROVIDER_SITE_OTHER): Payer: PRIVATE HEALTH INSURANCE | Admitting: Cardiology

## 2011-10-24 ENCOUNTER — Encounter: Payer: Self-pay | Admitting: Cardiology

## 2011-10-24 ENCOUNTER — Encounter: Payer: Self-pay | Admitting: Internal Medicine

## 2011-10-24 VITALS — BP 108/58 | HR 70 | Ht 73.0 in | Wt 195.0 lb

## 2011-10-24 DIAGNOSIS — I2510 Atherosclerotic heart disease of native coronary artery without angina pectoris: Secondary | ICD-10-CM

## 2011-10-24 DIAGNOSIS — I509 Heart failure, unspecified: Secondary | ICD-10-CM

## 2011-10-24 DIAGNOSIS — R9430 Abnormal result of cardiovascular function study, unspecified: Secondary | ICD-10-CM | POA: Insufficient documentation

## 2011-10-24 DIAGNOSIS — I428 Other cardiomyopathies: Secondary | ICD-10-CM

## 2011-10-24 DIAGNOSIS — IMO0002 Reserved for concepts with insufficient information to code with codable children: Secondary | ICD-10-CM

## 2011-10-24 DIAGNOSIS — I2589 Other forms of chronic ischemic heart disease: Secondary | ICD-10-CM

## 2011-10-24 DIAGNOSIS — R0989 Other specified symptoms and signs involving the circulatory and respiratory systems: Secondary | ICD-10-CM

## 2011-10-24 DIAGNOSIS — I4891 Unspecified atrial fibrillation: Secondary | ICD-10-CM

## 2011-10-24 DIAGNOSIS — Z95810 Presence of automatic (implantable) cardiac defibrillator: Secondary | ICD-10-CM

## 2011-10-24 LAB — ICD DEVICE OBSERVATION
AL AMPLITUDE: 4.5 mv
AL IMPEDENCE ICD: 360 Ohm
AL THRESHOLD: 1 V
ATRIAL PACING ICD: 84 pct
BAMS-0001: 180 {beats}/min
BAMS-0003: 70 {beats}/min
CHARGE TIME: 9.1 s
DEV-0020ICD: NEGATIVE
DEVICE MODEL ICD: 1042049
HV IMPEDENCE: 66 Ohm
LV LEAD IMPEDENCE ICD: 480 Ohm
LV LEAD THRESHOLD: 0.625 V
RV LEAD AMPLITUDE: 12 mv
RV LEAD IMPEDENCE ICD: 490 Ohm
RV LEAD THRESHOLD: 1.5 V
TZAT-0001SLOWVT: 1
TZAT-0004SLOWVT: 8
TZAT-0012SLOWVT: 200 ms
TZAT-0013SLOWVT: 2
TZAT-0018SLOWVT: NEGATIVE
TZAT-0019SLOWVT: 7.5 V
TZAT-0020SLOWVT: 1 ms
TZON-0003SLOWVT: 300 ms
TZON-0004SLOWVT: 30
TZON-0005SLOWVT: 6
TZON-0010SLOWVT: 40 ms
TZST-0001SLOWVT: 2
TZST-0001SLOWVT: 3
TZST-0001SLOWVT: 4
TZST-0001SLOWVT: 5
TZST-0003SLOWVT: 800 V
TZST-0003SLOWVT: 890 V
TZST-0003SLOWVT: 890 V
VENTRICULAR PACING ICD: 98 pct

## 2011-10-24 NOTE — Progress Notes (Signed)
defib check in clinic  

## 2011-10-24 NOTE — Progress Notes (Signed)
HPI  Patient returns for followup cardiomyopathy and coronary disease. She is working with him earlier this year he has received a CRT-D device. He's doing well with this. He has not had a followup echo since that time. He does feel that he has less shortness of breath when walking up a hill. It appears that the CRT device is working well. I'm hopeful that he's had some improvement in overall LV function.  He has not any chest pain.  I saw him last in the office April, 2013. I have reviewed the records from Dr. Graciela Husbands. I reviewed all of his old echo reports. No Known Allergies  Current Outpatient Prescriptions  Medication Sig Dispense Refill  . aspirin EC 81 MG tablet Take 1 tablet (81 mg total) by mouth daily.      . benazepril (LOTENSIN) 20 MG tablet Take 10 mg by mouth daily.      . carvedilol (COREG) 25 MG tablet Take 1 tablet (25 mg total) by mouth 2 (two) times daily with a meal.  180 tablet  1  . digoxin (LANOXIN) 0.125 MG tablet Take 1 tablet (0.125 mg total) by mouth daily.  90 tablet  1  . furosemide (LASIX) 40 MG tablet Take 1 tablet (40 mg total) by mouth daily.  90 tablet  1  . glucose blood (ONE TOUCH ULTRA TEST) test strip 1 each by Other route 2 (two) times daily. And lancets 2/day, 250.01  100 each  12  . insulin lispro (HUMALOG KWIKPEN) 100 UNIT/ML injection 3x a day (just before each meal) 13-7-12 units, and pen needles 3/day  15 mL  11  . Insulin Pen Needle 29G X MISC Pen needles of pt's choice  for Lantus Solostar  100 each  1  . levothyroxine (SYNTHROID, LEVOTHROID) 88 MCG tablet Take 1 tablet (88 mcg total) by mouth daily.  90 tablet  1  . simvastatin (ZOCOR) 40 MG tablet Take 1.5 tablets (60 mg total) by mouth every evening.  135 tablet  1  . DISCONTD: glimepiride (AMARYL) 4 MG tablet 1.5 tablet a day  30 tablet  11    History   Social History  . Marital Status: Single    Spouse Name: N/A    Number of Children: 0  . Years of Education: N/A   Occupational  History  . musician    Social History Main Topics  . Smoking status: Never Smoker   . Smokeless tobacco: Never Used  . Alcohol Use: No     Hx of abuse/clean since 2003   . Drug Use: No     Hx of abuse/clean for many years  . Sexually Active: Not on file   Other Topics Concern  . 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 Adopted: Yes    Past Medical History  Diagnosis Date  . Hodgkin's disease      Dx aprox 2004, s/p XRT-Chemo  . CAD (coronary artery disease)     CABG 2003  . Hyperlipidemia     low HDL  . DM type 2 (diabetes mellitus, type 2)   . Hypothyroidism   . Polysubstance     Alcohol, cocaine- resoloved for many years   . Atrial fibrillation     Paroxysmal, limited  . IVCD (intraventricular conduction defect)   .  CRI (chronic renal insufficiency)      multifactorial... Dr. Allena Azure Barrales.. consult September 18, 2009  . Hyperkalemia     August, 2011, Aldactone, Aldactone stopped  . Chronic systolic heart failure     EF 96-29% echo 5- 2011  . Ischemic cardiomyopathy     MRI12/12 no real viability in hypo/akinetic segment  CAth native and graft disease  . Elevated serum creatinine   . ICD (implantable cardiac defibrillator) in place     CRT-D placed March, 2013  . Ejection fraction < 50%     EF 25%, echo, November, 2012  //   planning followup 2-D echo to assess LV function with CRT D.  device in place    Past Surgical History  Procedure Date  . Tonsillectomy   . Coronary artery bypass graft   . Cystectomy     Upper Back    ROS   Patient denies fever, chills, headache, sweats, rash, change in vision, change in hearing, chest pain, cough, nausea vomiting, urinary symptoms. All other systems are reviewed and are negative.  PHYSICAL EXAM   Patient looks quite good. He is oriented to person  time and place. Affect is normal. There is no jugulovenous distention. Lungs are clear. Respiratory effort is nonlabored. Cardiac exam reveals S1 and S2. There no clicks or significant murmurs. His pacemaker site is completely healed. Abdomen is soft. Is no peripheral edema. There are no musculoskeletal deformities. There are no skin rashes.  Filed Vitals:   10/24/11 0914  BP: 108/58  Pulse: 70  Height: 6\' 1"  (1.854 m)  Weight: 195 lb (88.451 kg)  SpO2: 99%   EKG is done today and reviewed by me. He is paced. Also ICD interrogation is done. The ICD interrogation shows that his unit is working well. He has not had any significant arrhythmias.  ASSESSMENT & PLAN

## 2011-10-24 NOTE — Assessment & Plan Note (Signed)
I have carefully manage his medications. He developed hyperkalemia from Aldactone. It cannot be used. He is on good doses of other medications. No changes in his meds at this time. It is time for him to have a followup 2-D echo. This will help assess his ejection fraction with his CRT device in place and working well.

## 2011-10-24 NOTE — Assessment & Plan Note (Signed)
Coronary disease is stable. He did undergo November, 2012. No further workup now.

## 2011-10-24 NOTE — Assessment & Plan Note (Signed)
He has no significant signs of clinical heart failure at this time. No change in therapy.

## 2011-10-24 NOTE — Patient Instructions (Addendum)
Your physician wants you to follow-up in: 1 YEAR WITH DR. KATZ You will receive a reminder letter in the mail two months in advance. If you don't receive a letter, please call our office to schedule the follow-up appointment.   Your physician recommends that you continue on your current medications as directed. Please refer to the Current Medication list given to you today.  Your physician has requested that you have an echocardiogram.DX: TO CHECK LV FUNCTION Echocardiography is a painless test that uses sound waves to create images of your heart. It provides your doctor with information about the size and shape of your heart and how well your heart's chambers and valves are working. This procedure takes approximately one hour. There are no restrictions for this procedure. '

## 2011-10-24 NOTE — Assessment & Plan Note (Signed)
His device was interrogated today. Is working well he's not had any significant arrhythmias.

## 2011-10-29 ENCOUNTER — Ambulatory Visit (HOSPITAL_COMMUNITY): Payer: PRIVATE HEALTH INSURANCE | Attending: Cardiology

## 2011-10-29 DIAGNOSIS — I4891 Unspecified atrial fibrillation: Secondary | ICD-10-CM | POA: Insufficient documentation

## 2011-10-29 DIAGNOSIS — Z95810 Presence of automatic (implantable) cardiac defibrillator: Secondary | ICD-10-CM

## 2011-10-29 DIAGNOSIS — I2589 Other forms of chronic ischemic heart disease: Secondary | ICD-10-CM | POA: Insufficient documentation

## 2011-10-29 DIAGNOSIS — I2510 Atherosclerotic heart disease of native coronary artery without angina pectoris: Secondary | ICD-10-CM

## 2011-10-29 DIAGNOSIS — N189 Chronic kidney disease, unspecified: Secondary | ICD-10-CM | POA: Insufficient documentation

## 2011-10-29 DIAGNOSIS — I059 Rheumatic mitral valve disease, unspecified: Secondary | ICD-10-CM | POA: Insufficient documentation

## 2011-10-29 DIAGNOSIS — I509 Heart failure, unspecified: Secondary | ICD-10-CM | POA: Insufficient documentation

## 2011-10-29 DIAGNOSIS — E119 Type 2 diabetes mellitus without complications: Secondary | ICD-10-CM | POA: Insufficient documentation

## 2011-10-29 DIAGNOSIS — I369 Nonrheumatic tricuspid valve disorder, unspecified: Secondary | ICD-10-CM | POA: Insufficient documentation

## 2011-10-29 DIAGNOSIS — R9430 Abnormal result of cardiovascular function study, unspecified: Secondary | ICD-10-CM

## 2011-10-29 NOTE — Progress Notes (Signed)
Echocardiogram performed.  

## 2011-11-08 ENCOUNTER — Ambulatory Visit (INDEPENDENT_AMBULATORY_CARE_PROVIDER_SITE_OTHER): Payer: PRIVATE HEALTH INSURANCE | Admitting: Internal Medicine

## 2011-11-08 VITALS — BP 116/68 | HR 60 | Temp 98.0°F | Wt 195.0 lb

## 2011-11-08 DIAGNOSIS — Z0000 Encounter for general adult medical examination without abnormal findings: Secondary | ICD-10-CM

## 2011-11-08 DIAGNOSIS — E7800 Pure hypercholesterolemia, unspecified: Secondary | ICD-10-CM

## 2011-11-08 LAB — BASIC METABOLIC PANEL
BUN: 39 mg/dL — ABNORMAL HIGH (ref 6–23)
CO2: 29 mEq/L (ref 19–32)
Calcium: 9.4 mg/dL (ref 8.4–10.5)
Chloride: 100 mEq/L (ref 96–112)
Creatinine, Ser: 1.4 mg/dL (ref 0.4–1.5)
GFR: 55.01 mL/min — ABNORMAL LOW (ref >60.00)
Glucose, Bld: 185 mg/dL — ABNORMAL HIGH (ref 70–99)
Potassium: 4.7 mEq/L (ref 3.5–5.1)
Sodium: 136 mEq/L (ref 135–145)

## 2011-11-08 LAB — LIPID PANEL
Cholesterol: 178 mg/dL (ref 0–200)
HDL: 33.3 mg/dL — ABNORMAL LOW (ref >39.00)
Total CHOL/HDL Ratio: 5
Triglycerides: 269 mg/dL — ABNORMAL HIGH (ref 0.0–149.0)
VLDL: 53.8 mg/dL — ABNORMAL HIGH (ref 0.0–40.0)

## 2011-11-08 LAB — IRON: Iron: 58 ug/dL (ref 42–165)

## 2011-11-08 LAB — FERRITIN: Ferritin: 51.7 ng/mL (ref 22.0–322.0)

## 2011-11-08 LAB — TSH: TSH: 2.33 u[IU]/mL (ref 0.35–5.50)

## 2011-11-08 LAB — HEMOGLOBIN: Hemoglobin: 12.2 g/dL — ABNORMAL LOW (ref 13.0–17.0)

## 2011-11-08 LAB — LDL CHOLESTEROL, DIRECT: Direct LDL: 93.6 mg/dL

## 2011-11-08 MED ORDER — BENAZEPRIL HCL 20 MG PO TABS: 10.0000 mg | ORAL_TABLET | Freq: Every day | ORAL | Status: DC

## 2011-11-08 NOTE — Assessment & Plan Note (Addendum)
Td 03 08-2010 Pneumonia and flu shots, strongly declined, explained benefits Labs  Diet exercise discussed Cscope Vs IFOB discussed---------->  will refer to GI Chronic medical issues: Check a TSH, heart issues stable per last office visit with cardiology. Followup with Dr. Everardo All for diabetes care, rec to continue inspecting his feet. Return to clinic actually in 1 year

## 2011-11-08 NOTE — Progress Notes (Signed)
  Subjective:    Patient ID: Caleb Bell, male    DOB: Jul 02, 1955, 56 y.o.   MRN: [id number]  HPI CPX  Past medical history   DM   Neuropathy: foot ulcer 8-12   Hyperlipidemia, low HDL  Hypothyroidism   HODGKIN'S DISEASE--Dx aprox 2004, s/p XRT-Chemo  H/o poly substance abuse  Cardiovascular:   --EF 40% range / EF 20-25%...echo..06/14/2009, EF 30-35%, echo, January, 2012   --CAD----> CABG 2003   --Atrial fibrillation , paroxismal   --ICVD   -- Defibrillator implant 03-2011   CRI:  --Creatinine... up to 1.5.Marland KitchenMarland KitchenMarland Kitchen 2011 / CKD 3.. multifactorial... Dr. Allena Katz.. 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     Objective:   Physical Exam General -- alert, well-developed, and well-nourished.   Neck --no thyromegaly  Lungs -- normal respiratory effort, no intercostal retractions, no accessory muscle use, and normal breath sounds.   Heart-- normal rate, regular rhythm, no murmur, and no gallop.   Abdomen--soft, non-tender, no distention, no masses, no HSM, no guarding, and no rigidity.   Extremities-- no pretibial edema bilaterally Rectal-- No external abnormalities noted. Normal sphincter tone. No rectal masses or tenderness. Brown stool, Hemoccult negative Prostate:  Prostate gland firm and smooth, no enlargement, nodularity, tenderness,  mass, asymmetry or induration. Neurologic-- alert & oriented X3 and strength normal in all extremities. Psych-- Cognition and judgment appear intact. Alert and cooperative with normal attention span and concentration.  not anxious appearing and not depressed appearing.        Assessment & Plan:

## 2011-11-09 ENCOUNTER — Encounter: Payer: Self-pay | Admitting: Internal Medicine

## 2011-11-11 ENCOUNTER — Encounter: Payer: Self-pay | Admitting: Internal Medicine

## 2011-11-12 MED ORDER — ROSUVASTATIN CALCIUM 40 MG PO TABS: 40.0000 mg | ORAL_TABLET | Freq: Every day | ORAL | Status: DC

## 2011-11-12 NOTE — Addendum Note (Signed)
Addended by: Edwena Felty T on: 11/12/2011 10:39 AM   Modules accepted: Orders

## 2011-11-26 ENCOUNTER — Other Ambulatory Visit (INDEPENDENT_AMBULATORY_CARE_PROVIDER_SITE_OTHER): Payer: PRIVATE HEALTH INSURANCE

## 2011-11-26 ENCOUNTER — Telehealth: Payer: Self-pay | Admitting: *Deleted

## 2011-11-26 DIAGNOSIS — E1129 Type 2 diabetes mellitus with other diabetic kidney complication: Secondary | ICD-10-CM

## 2011-11-26 DIAGNOSIS — E039 Hypothyroidism, unspecified: Secondary | ICD-10-CM

## 2011-11-26 LAB — HEMOGLOBIN A1C: Hgb A1c MFr Bld: 7.2 % — ABNORMAL HIGH (ref 4.6–6.5)

## 2011-11-26 LAB — TSH: TSH: 1.95 u[IU]/mL (ref 0.35–5.50)

## 2011-11-26 NOTE — Telephone Encounter (Signed)
Opened encounter in error  

## 2011-11-28 ENCOUNTER — Encounter: Payer: Self-pay | Admitting: Endocrinology

## 2011-11-28 ENCOUNTER — Ambulatory Visit (INDEPENDENT_AMBULATORY_CARE_PROVIDER_SITE_OTHER): Payer: PRIVATE HEALTH INSURANCE | Admitting: Endocrinology

## 2011-11-28 VITALS — BP 126/70 | HR 90 | Temp 97.7°F | Wt 196.0 lb

## 2011-11-28 DIAGNOSIS — E1129 Type 2 diabetes mellitus with other diabetic kidney complication: Secondary | ICD-10-CM

## 2011-11-28 DIAGNOSIS — E1122 Type 2 diabetes mellitus with diabetic chronic kidney disease: Secondary | ICD-10-CM

## 2011-11-28 DIAGNOSIS — N189 Chronic kidney disease, unspecified: Secondary | ICD-10-CM

## 2011-11-28 NOTE — Progress Notes (Signed)
Subjective:    Patient ID: Caleb Bell, male    DOB: 24-Feb-1955, 56 y.o.   MRN: [id number]  HPI pt returns for f/u of insulin-requiring DM (dx'ed 2003; complicated by renal insuff, peripheral sensory neuropathy, and CAD).  pt states he feels well in general.  he brings a record of his cbg's which i have reviewed today.  It varies from 97-260.  It is lowest in the afternoon, and highest at hs. Past Medical History  Diagnosis Date  . Hodgkin's disease(201)      Dx aprox 2004, s/p XRT-Chemo  . CAD (coronary artery disease)     CABG 2003  . Hyperlipidemia     low HDL  . DM type 2 (diabetes mellitus, type 2)   . Hypothyroidism   . Polysubstance     Alcohol, cocaine- resoloved for many years   . Atrial fibrillation     Paroxysmal, limited  . IVCD (intraventricular conduction defect)   . CRI (chronic renal insufficiency)      multifactorial... Dr. Allena Katz.. consult September 18, 2009  . Hyperkalemia     August, 2011, Aldactone, Aldactone stopped  . Chronic systolic heart failure     EF 40-98% echo 5- 2011  . Ischemic cardiomyopathy     MRI12/12 no real viability in hypo/akinetic segment  CAth native and graft disease  . Elevated serum creatinine   . ICD (implantable cardiac defibrillator) in place     CRT-D placed March, 2013  . Ejection fraction < 50%     EF 25%, echo, November, 2012  //   planning followup 2-D echo to assess LV function with CRT D.  device in place    Past Surgical History  Procedure Date  . Tonsillectomy   . Coronary artery bypass graft   . Cystectomy     Upper Back    History   Social History  . Marital Status: Single    Spouse Name: N/A    Number of Children: 0  . Years of Education: N/A   Occupational History  . musician    Social History Main Topics  . Smoking status: Never Smoker   . Smokeless tobacco: Never Used  . Alcohol Use: No     Comment: Hx of abuse/clean since 2003   . Drug Use: No     Comment: Hx of abuse/clean for many years    . Sexually Active: Not on file   Other Topics Concern  . 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 aspirin EC 81 MG tablet Take 1 tablet (81 mg total) by mouth daily.      . benazepril (LOTENSIN) 20 MG tablet Take 0.5 tablets (10 mg total) by mouth daily.  90 tablet  1  . carvedilol (COREG) 25 MG tablet Take 1 tablet (25 mg total) by mouth 2 (two) times daily with a meal.  180 tablet  1  . digoxin (LANOXIN) 0.125 MG tablet Take 1 tablet (0.125 mg total) by mouth daily.  90 tablet  1  . furosemide (LASIX) 40 MG tablet Take 1 tablet (40 mg total) by mouth daily.  90 tablet  1  . glucose blood (ONE TOUCH ULTRA TEST) test strip 1 each by Other route 2 (two) times daily. And lancets 2/day, 250.01  100 each  12  . insulin lispro (HUMALOG) 100 UNIT/ML injection 3x a day (just before each meal) 13-7-14 units, and pen needles 3/day      . Insulin Pen Needle 29G X MISC Pen needles of pt's choice  for Lantus Solostar  100 each  1  . levothyroxine (SYNTHROID, LEVOTHROID) 88 MCG tablet Take 1 tablet (88 mcg total) by mouth daily.  90 tablet  1  . rosuvastatin (CRESTOR) 40 MG tablet Take 1 tablet (40 mg total) by mouth daily.  90 tablet  0  . simvastatin (ZOCOR) 40 MG tablet Take 1.5 tablets (60 mg total) by mouth every evening.  135 tablet  1  . [DISCONTINUED] glimepiride (AMARYL) 4 MG tablet 1.5 tablet a day  30 tablet  11    No Known Allergies  Family History  Problem Relation Age of Onset  . Adopted: Yes    BP 126/70  Pulse 90  Temp 97.7 F (36.5 C) (Oral)  Wt 196 lb (88.905 kg)  SpO2 94%  Review of Systems denies hypoglycemia    Objective:   Physical Exam VITAL SIGNS:  See vs page GENERAL: no distress PSYCH: Alert and oriented x 3.  Does not appear anxious nor  depressed.   Lab Results  Component Value Date   HGBA1C 7.2* 11/26/2011      Assessment & Plan:  DM, needs increased rx

## 2011-11-28 NOTE — Patient Instructions (Addendum)
check your blood sugar 3 times a day.  vary the time of day when you check, between before the 3 meals, and at bedtime.  also check if you have symptoms of your blood sugar being too high or too low.  please keep a record of the readings and bring it to your next appointment here.  please call us sooner if your blood sugar goes below 70, or if it stays over 200. Increase humalog to 3x a day (just before each meal) 13-7-14 units.   Please come back for a follow-up appointment in 3 months.

## 2011-12-10 ENCOUNTER — Encounter: Payer: Self-pay | Admitting: Internal Medicine

## 2011-12-10 ENCOUNTER — Ambulatory Visit (INDEPENDENT_AMBULATORY_CARE_PROVIDER_SITE_OTHER): Payer: PRIVATE HEALTH INSURANCE | Admitting: Internal Medicine

## 2011-12-10 VITALS — BP 110/68 | HR 60 | Ht 73.0 in | Wt 198.0 lb

## 2011-12-10 DIAGNOSIS — N189 Chronic kidney disease, unspecified: Secondary | ICD-10-CM

## 2011-12-10 DIAGNOSIS — E1129 Type 2 diabetes mellitus with other diabetic kidney complication: Secondary | ICD-10-CM

## 2011-12-10 DIAGNOSIS — E1122 Type 2 diabetes mellitus with diabetic chronic kidney disease: Secondary | ICD-10-CM

## 2011-12-10 DIAGNOSIS — Z01818 Encounter for other preprocedural examination: Secondary | ICD-10-CM

## 2011-12-10 DIAGNOSIS — Z1211 Encounter for screening for malignant neoplasm of colon: Secondary | ICD-10-CM

## 2011-12-10 DIAGNOSIS — I509 Heart failure, unspecified: Secondary | ICD-10-CM

## 2011-12-10 MED ORDER — MOVIPREP 100 G PO SOLR: ORAL | Status: DC

## 2011-12-10 NOTE — Progress Notes (Signed)
Subjective:    Patient ID: Caleb Bell, male    DOB: 08/12/1955, 56 y.o.   MRN: [id number]  HPI This is a very nice middle-aged man here to arrange a screening colonoscopy in the setting of CHF with EF < 35% and an AICD. He remains active working as a musician. He is not having any GI signs or symptoms. He has never had a colonoscopy.  No Known Allergies Outpatient Prescriptions Prior to Visit  Medication Sig Dispense Refill  . aspirin EC 81 MG tablet Take 1 tablet (81 mg total) by mouth daily.      . benazepril (LOTENSIN) 20 MG tablet Take 0.5 tablets (10 mg total) by mouth daily.  90 tablet  1  . carvedilol (COREG) 25 MG tablet Take 1 tablet (25 mg total) by mouth 2 (two) times daily with a meal.  180 tablet  1  . digoxin (LANOXIN) 0.125 MG tablet Take 1 tablet (0.125 mg total) by mouth daily.  90 tablet  1  . furosemide (LASIX) 40 MG tablet Take 1 tablet (40 mg total) by mouth daily.  90 tablet  1  . glucose blood (ONE TOUCH ULTRA TEST) test strip 1 each by Other route 2 (two) times daily. And lancets 2/day, 250.01  100 each  12  . insulin lispro (HUMALOG) 100 UNIT/ML injection 3x a day (just before each meal) 13-7-14 units, and pen needles 3/day      . Insulin Pen Needle 29G X 12MM MISC Pen needles of pt's choice  for Lantus Solostar  100 each  1  . levothyroxine (SYNTHROID, LEVOTHROID) 88 MCG tablet Take 1 tablet (88 mcg total) by mouth daily.  90 tablet  1  . rosuvastatin (CRESTOR) 40 MG tablet Take 1 tablet (40 mg total) by mouth daily.  90 tablet  0  . simvastatin (ZOCOR) 40 MG tablet Take 1.5 tablets (60 mg total) by mouth every evening.  135 tablet  1   Last reviewed on 12/10/2011 10:31 AM by Elad Macphail E Karry Causer, MD Past Medical History  Diagnosis Date  . Hodgkin's disease(201)      Dx aprox 2004, s/p XRT-Chemo  . CAD (coronary artery disease)     CABG 2003  . Hyperlipidemia     low HDL  . DM type 2 (diabetes mellitus, type 2)   . Hypothyroidism   . Polysubstance    Alcohol, cocaine- resoloved for many years   . Atrial fibrillation     Paroxysmal, limited  . IVCD (intraventricular conduction defect)   . CRI (chronic renal insufficiency)      multifactorial... Dr. Patel.. consult September 18, 2009  . Hyperkalemia     August, 2011, Aldactone, Aldactone stopped  . Chronic systolic heart failure     EF 20-25% echo 5- 2011  . Ischemic cardiomyopathy     MRI12/12 no real viability in hypo/akinetic segment  CAth native and graft disease  . Elevated serum creatinine   . ICD (implantable cardiac defibrillator) in place     CRT-D placed March, 2013  . Ejection fraction < 50%     EF 25%, echo, November, 2012  //   planning followup 2-D echo to assess LV function with CRT D.  device in place   Past Surgical History  Procedure Date  . Tonsillectomy   . Coronary artery bypass graft 2003  . Cystectomy     Upper Back  . Cardiac defibrillator placement    History   Social History  . Marital Status:   Single    Spouse Name: N/A    Number of Children: 0  . Years of Education: N/A   Occupational History  . musician    Social History Main Topics  . Smoking status: Never Smoker   . Smokeless tobacco: Never Used  . Alcohol Use: No     Comment: Hx of abuse/clean since 2003   . Drug Use: No     Comment: Hx of abuse/clean for many years  .            Social History Narrative   Regular Exercise-- yes (walking daily) --- diet: needs improvement MusicianSingle, no childrenPatient Adopted--no family Hx   Family History  Problem Relation Age of Onset  . Adopted: Yes     Review of Systems As per HPI and all other ROS negative    Objective:   Physical Exam General:  NAD Eyes:   anicteric Lungs:  clear Heart:  S1S2 no rubs, murmurs or gallops Abdomen:  soft and nontender, BS+ Ext:   no edema    Data Reviewed:  Echo, cardiology and PCP notes last 1-2 years    Assessment & Plan:   1. Special screening for malignant neoplasms, colon  0.9 %   sodium chloride infusion, Ambulatory referral to Gastroenterology  2. Preoperative examination, unspecified    3. CHF (congestive heart failure)    4. Diabetes mellitus with chronic kidney disease     1. Appropriate for a screening colonoscopy but comorbidities make him Class IV ASA and he will be done at hospital with moderate sedation. The risks and benefits as well as alternatives of endoscopic procedure(s) have been discussed and reviewed. All questions answered. The patient agrees to proceed.   

## 2011-12-10 NOTE — Patient Instructions (Addendum)
You have been scheduled for a colonoscopy at [facility]. Please follow written instructions given to you at your visit today.  Please pick up your prep kit at the pharmacy within the next 1-3 days. If you use inhalers (even only as needed) or a CPAP machine, please bring them with you on the day of your procedure.  Thank you for choosing me and [facility].  Iva Boop, [name], [facility]

## 2011-12-11 ENCOUNTER — Encounter: Payer: Self-pay | Admitting: Internal Medicine

## 2011-12-11 ENCOUNTER — Other Ambulatory Visit: Payer: Self-pay | Admitting: Internal Medicine

## 2011-12-23 ENCOUNTER — Other Ambulatory Visit: Payer: PRIVATE HEALTH INSURANCE

## 2011-12-24 ENCOUNTER — Encounter (HOSPITAL_COMMUNITY)
Admission: RE | Disposition: A | Discharge status: Home / Self Care | Payer: Self-pay | Source: Ambulatory Visit | Attending: Internal Medicine

## 2011-12-24 ENCOUNTER — Telehealth: Payer: Self-pay | Admitting: Internal Medicine

## 2011-12-24 ENCOUNTER — Encounter (HOSPITAL_COMMUNITY): Payer: Self-pay

## 2011-12-24 ENCOUNTER — Ambulatory Visit (HOSPITAL_COMMUNITY)
Admission: RE | Admit: 2011-12-24 | Discharge: 2011-12-24 | Disposition: A | Discharge status: Home / Self Care | Payer: Medicare (Managed Care) | Source: Ambulatory Visit | Attending: Internal Medicine | Admitting: Internal Medicine

## 2011-12-24 DIAGNOSIS — E1129 Type 2 diabetes mellitus with other diabetic kidney complication: Secondary | ICD-10-CM | POA: Insufficient documentation

## 2011-12-24 DIAGNOSIS — Z1211 Encounter for screening for malignant neoplasm of colon: Secondary | ICD-10-CM

## 2011-12-24 DIAGNOSIS — Z860100 Personal history of colon polyps, unspecified: Secondary | ICD-10-CM

## 2011-12-24 DIAGNOSIS — Z794 Long term (current) use of insulin: Secondary | ICD-10-CM | POA: Insufficient documentation

## 2011-12-24 DIAGNOSIS — Z79899 Other long term (current) drug therapy: Secondary | ICD-10-CM | POA: Insufficient documentation

## 2011-12-24 DIAGNOSIS — I2510 Atherosclerotic heart disease of native coronary artery without angina pectoris: Secondary | ICD-10-CM | POA: Insufficient documentation

## 2011-12-24 DIAGNOSIS — D126 Benign neoplasm of colon, unspecified: Secondary | ICD-10-CM

## 2011-12-24 DIAGNOSIS — E039 Hypothyroidism, unspecified: Secondary | ICD-10-CM | POA: Insufficient documentation

## 2011-12-24 DIAGNOSIS — I5022 Chronic systolic (congestive) heart failure: Secondary | ICD-10-CM | POA: Insufficient documentation

## 2011-12-24 DIAGNOSIS — Z95810 Presence of automatic (implantable) cardiac defibrillator: Secondary | ICD-10-CM | POA: Insufficient documentation

## 2011-12-24 DIAGNOSIS — I509 Heart failure, unspecified: Secondary | ICD-10-CM | POA: Insufficient documentation

## 2011-12-24 DIAGNOSIS — E785 Hyperlipidemia, unspecified: Secondary | ICD-10-CM | POA: Insufficient documentation

## 2011-12-24 DIAGNOSIS — Z7982 Long term (current) use of aspirin: Secondary | ICD-10-CM | POA: Insufficient documentation

## 2011-12-24 DIAGNOSIS — Z951 Presence of aortocoronary bypass graft: Secondary | ICD-10-CM | POA: Insufficient documentation

## 2011-12-24 DIAGNOSIS — N189 Chronic kidney disease, unspecified: Secondary | ICD-10-CM | POA: Insufficient documentation

## 2011-12-24 DIAGNOSIS — I2589 Other forms of chronic ischemic heart disease: Secondary | ICD-10-CM | POA: Insufficient documentation

## 2011-12-24 HISTORY — DX: Personal history of colonic polyps: Z86.010

## 2011-12-24 HISTORY — DX: Personal history of colon polyps, unspecified: Z86.0100

## 2011-12-24 HISTORY — PX: COLONOSCOPY: SHX5424

## 2011-12-24 LAB — GLUCOSE, CAPILLARY: Glucose-Capillary: 141 mg/dL — ABNORMAL HIGH (ref 70–99)

## 2011-12-24 SURGERY — COLONOSCOPY
Anesthesia: Moderate Sedation

## 2011-12-24 MED ORDER — SODIUM CHLORIDE 0.9 % IV SOLN
INTRAVENOUS | Status: DC
  Administered 2011-12-24: 10:00:00 via INTRAVENOUS

## 2011-12-24 MED ORDER — MIDAZOLAM HCL 10 MG/2ML IJ SOLN
INTRAMUSCULAR | Status: DC | PRN
  Administered 2011-12-24: 2 mg via INTRAVENOUS
  Administered 2011-12-24 (×2): 1 mg via INTRAVENOUS
  Administered 2011-12-24: 2 mg via INTRAVENOUS

## 2011-12-24 MED ORDER — DIPHENHYDRAMINE HCL 50 MG/ML IJ SOLN
INTRAMUSCULAR | Status: AC
  Filled 2011-12-24: qty 1

## 2011-12-24 MED ORDER — FENTANYL CITRATE 0.05 MG/ML IJ SOLN
INTRAMUSCULAR | Status: AC
  Filled 2011-12-24: qty 4

## 2011-12-24 MED ORDER — FENTANYL CITRATE 0.05 MG/ML IJ SOLN
INTRAMUSCULAR | Status: DC | PRN
  Administered 2011-12-24 (×3): 25 ug via INTRAVENOUS

## 2011-12-24 MED ORDER — MIDAZOLAM HCL 10 MG/2ML IJ SOLN
INTRAMUSCULAR | Status: AC
  Filled 2011-12-24: qty 4

## 2011-12-24 NOTE — Telephone Encounter (Signed)
Please arrange labs FLP, AST, ALT --- dx high cholesterol

## 2011-12-24 NOTE — Op Note (Signed)
[facility] [address] [place], [address]   COLONOSCOPY PROCEDURE REPORT  PATIENT: Caleb Bell, Caleb Bell  MR#: [id number] BIRTHDATE: 28-Nov-1955 , 56  yrs. old GENDER: Male ENDOSCOPIST: Iva Boop, MD, [facility] REFERRED XB:JYNW Drue Novel, [name] PROCEDURE DATE:  12/24/2011 PROCEDURE:   Colonoscopy with snare polypectomy ASA CLASS:   Class IV INDICATIONS:Average risk patient for colon cancer. MEDICATIONS: Fentanyl 75 mcg IV and Versed 6 mg IV  DESCRIPTION OF PROCEDURE:   After the risks benefits and alternatives of the procedure were thoroughly explained, informed consent was obtained.  A digital rectal exam revealed no abnormalities of the rectum and A digital rectal exam revealed the prostate was not enlarged.   The Pentax Colonoscope [id number] endoscope was introduced through the anus and advanced to the cecum, which was identified by both the appendix and ileocecal valve. No adverse events experienced.   The quality of the prep was adequate, using MoviPrep  The instrument was then slowly withdrawn as the colon was fully examined.      COLON FINDINGS: Two polypoid shaped sessile polyps measuring 5-8 mm in size were found at the appendiceal orifice (8mm) and in the ascending colon (5mm).  A polypectomy was performed with a cold snare.  The resection was complete and the polyp tissue was completely retrieved.   The colon mucosa was otherwise normal. Retroflexed views revealed no abnormalities. The time to cecum=3 minutes 0 seconds.  Withdrawal time=19 minutes 0 seconds.  The scope was withdrawn and the procedure completed. COMPLICATIONS: There were no complications.  ENDOSCOPIC IMPRESSION: 1.   Two sessile polyps measuring 5-8 mm in size were found at the appendiceal orifice and in the ascending colon; polypectomy was performed with a cold snare 2.   The colon mucosa was otherwise normal -adequate prep  RECOMMENDATIONS: Timing of repeat colonoscopy will be  determined by pathology findings.   eSigned:  Iva Boop, MD, [facility] 12/24/2011 10:34 AM   cc: Willow Ora, MD and The Patient

## 2011-12-24 NOTE — H&P (View-Only) (Signed)
Subjective:    Patient ID: Caleb Bell, male    DOB: Jul 14, 1955, 56 y.o.   MRN: [id number]  HPI This is a very nice middle-aged man here to arrange a screening colonoscopy in the setting of CHF with EF < 35% and an AICD. 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 He is not having any GI signs or symptoms. He has never had a colonoscopy.  No Known Allergies Outpatient Prescriptions Prior to Visit  Medication Sig Dispense Refill  . aspirin EC 81 MG tablet Take 1 tablet (81 mg total) by mouth daily.      . benazepril (LOTENSIN) 20 MG tablet Take 0.5 tablets (10 mg total) by mouth daily.  90 tablet  1  . carvedilol (COREG) 25 MG tablet Take 1 tablet (25 mg total) by mouth 2 (two) times daily with a meal.  180 tablet  1  . digoxin (LANOXIN) 0.125 MG tablet Take 1 tablet (0.125 mg total) by mouth daily.  90 tablet  1  . furosemide (LASIX) 40 MG tablet Take 1 tablet (40 mg total) by mouth daily.  90 tablet  1  . glucose blood (ONE TOUCH ULTRA TEST) test strip 1 each by Other route 2 (two) times daily. And lancets 2/day, 250.01  100 each  12  . insulin lispro (HUMALOG) 100 UNIT/ML injection 3x a day (just before each meal) 13-7-14 units, and pen needles 3/day      . Insulin Pen Needle 29G X MISC Pen needles of pt's choice  for Lantus Solostar  100 each  1  . levothyroxine (SYNTHROID, LEVOTHROID) 88 MCG tablet Take 1 tablet (88 mcg total) by mouth daily.  90 tablet  1  . rosuvastatin (CRESTOR) 40 MG tablet Take 1 tablet (40 mg total) by mouth daily.  90 tablet  0  . simvastatin (ZOCOR) 40 MG tablet Take 1.5 tablets (60 mg total) by mouth every evening.  135 tablet  1   Last reviewed on 12/10/2011 10:31 AM by Iva Boop, MD Past Medical History  Diagnosis Date  . Hodgkin's disease(201)      Dx aprox 2004, s/p XRT-Chemo  . CAD (coronary artery disease)     CABG 2003  . Hyperlipidemia     low HDL  . DM type 2 (diabetes mellitus, type 2)   . Hypothyroidism   . Polysubstance    Alcohol, cocaine- resoloved for many years   . Atrial fibrillation     Paroxysmal, limited  . IVCD (intraventricular conduction defect)   . CRI (chronic renal insufficiency)      multifactorial... Dr. Allena Katz.. consult September 18, 2009  . Hyperkalemia     August, 2011, Aldactone, Aldactone stopped  . Chronic systolic heart failure     EF 69-62% echo 5- 2011  . Ischemic cardiomyopathy     MRI12/12 no real viability in hypo/akinetic segment  CAth native and graft disease  . Elevated serum creatinine   . ICD (implantable cardiac defibrillator) in place     CRT-D placed March, 2013  . Ejection fraction < 50%     EF 25%, echo, November, 2012  //   planning followup 2-D echo to assess LV function with CRT D.  device in place   Past Surgical History  Procedure Date  . Tonsillectomy   . Coronary artery bypass graft 2003  . Cystectomy     Upper Back  . Cardiac defibrillator placement    History   Social History  . Marital Status:  Single    Spouse Name: N/A    Number of Children: 0  . Years of Education: N/A   Occupational History  . musician    Social History Main Topics  . Smoking status: Never Smoker   . Smokeless tobacco: Never Used  . Alcohol Use: No     Comment: Hx of abuse/clean since 2003   . Drug Use: No     Comment: Hx of abuse/clean for many years  .            Social History Narrative   Regular Exercise-- yes (walking daily) --- diet: needs improvement MusicianSingle, no childrenPatient Adopted--no family Hx   Family History  Problem Relation Age of Onset  . Adopted: Yes     Review of Systems As per HPI and all other ROS negative    Objective:   Physical Exam General:  NAD Eyes:   anicteric Lungs:  clear Heart:  S1S2 no rubs, murmurs or gallops Abdomen:  soft and nontender, BS+ Ext:   no edema    Data Reviewed:  Echo, cardiology and PCP notes last 1-2 years    Assessment & Plan:   1. Special screening for malignant neoplasms, colon  0.9 %   sodium chloride infusion, Ambulatory referral to Gastroenterology  2. Preoperative examination, unspecified    3. CHF (congestive heart failure)    4. Diabetes mellitus with chronic kidney disease     1. Appropriate for a screening colonoscopy but comorbidities make him Class IV ASA and he will be done at hospital with moderate sedation. The risks and benefits as well as alternatives of endoscopic procedure(s) have been discussed and reviewed. All questions answered. The patient agrees to proceed.

## 2011-12-24 NOTE — Interval H&P Note (Signed)
History and Physical Interval Note:  12/24/2011 9:48 AM  Caleb Bell  has presented today for surgery, with the diagnosis of Screening [V82.9]  The various methods of treatment have been discussed with the patient and family. After consideration of risks, benefits and other options for treatment, the patient has consented to  Procedure(s) (LRB) with comments: COLONOSCOPY (N/A) as a surgical intervention .  The patient's history has been reviewed, patient examined, no change in status, stable for surgery.  I have reviewed the patient's chart and labs.  Questions were answered to the patient's satisfaction.     Stan Head, MD, [facility]

## 2011-12-25 ENCOUNTER — Encounter: Payer: Self-pay | Admitting: Internal Medicine

## 2011-12-25 ENCOUNTER — Encounter (HOSPITAL_COMMUNITY): Payer: Self-pay

## 2011-12-25 ENCOUNTER — Encounter (HOSPITAL_COMMUNITY): Payer: Self-pay | Admitting: Internal Medicine

## 2011-12-25 NOTE — Progress Notes (Signed)
Quick Note:  Sessile serrated adenoma 5mm and a tubular adenoma 8 mm Repeat colon 12/2014 ______

## 2011-12-26 ENCOUNTER — Encounter: Payer: Self-pay | Admitting: *Deleted

## 2011-12-26 NOTE — Telephone Encounter (Signed)
Pt scheduled for labs 12/31/11.

## 2011-12-30 ENCOUNTER — Telehealth: Payer: Self-pay | Admitting: Internal Medicine

## 2011-12-30 NOTE — Telephone Encounter (Signed)
I reviewed the results with the patient he will call back for any additional questions or concerns

## 2011-12-31 ENCOUNTER — Other Ambulatory Visit (INDEPENDENT_AMBULATORY_CARE_PROVIDER_SITE_OTHER): Payer: PRIVATE HEALTH INSURANCE

## 2011-12-31 DIAGNOSIS — E7800 Pure hypercholesterolemia, unspecified: Secondary | ICD-10-CM

## 2011-12-31 LAB — ALT: ALT: 26 U/L (ref 0–53)

## 2011-12-31 LAB — AST: AST: 17 U/L (ref 0–37)

## 2011-12-31 LAB — LDL CHOLESTEROL, DIRECT: Direct LDL: 63.8 mg/dL

## 2011-12-31 LAB — LIPID PANEL
Cholesterol: 130 mg/dL (ref 0–200)
HDL: 31.6 mg/dL — ABNORMAL LOW (ref >39.00)
Total CHOL/HDL Ratio: 4
Triglycerides: 356 mg/dL — ABNORMAL HIGH (ref 0.0–149.0)
VLDL: 71.2 mg/dL — ABNORMAL HIGH (ref 0.0–40.0)

## 2012-01-04 ENCOUNTER — Telehealth: Payer: Self-pay | Admitting: Internal Medicine

## 2012-01-04 NOTE — Telephone Encounter (Signed)
Advise patient: His cholesterol is now at goal, LDL less than 70. Recommend to continue with Crestor, if cost is an issue let me know, we could give him samples PRN as well I am releasing results to [employer]

## 2012-01-06 NOTE — Telephone Encounter (Signed)
lmovm for pt to return call.  

## 2012-01-08 NOTE — Telephone Encounter (Signed)
lmovm for pt to return call.  

## 2012-01-17 MED ORDER — SIMVASTATIN 40 MG PO TABS: 60.0000 mg | ORAL_TABLET | Freq: Every evening | ORAL | Status: DC

## 2012-01-17 MED ORDER — BENAZEPRIL HCL 20 MG PO TABS: 10.0000 mg | ORAL_TABLET | Freq: Every day | ORAL | Status: DC

## 2012-01-17 MED ORDER — ROSUVASTATIN CALCIUM 40 MG PO TABS: 40.0000 mg | ORAL_TABLET | Freq: Every day | ORAL | Status: DC

## 2012-01-17 NOTE — Addendum Note (Signed)
Addended by: Edwena Felty T on: 01/17/2012 08:35 AM   Modules accepted: Orders

## 2012-01-17 NOTE — Addendum Note (Signed)
Addended by: Edwena Felty T on: 01/17/2012 08:31 AM   Modules accepted: Orders

## 2012-01-17 NOTE — Telephone Encounter (Signed)
Pt called back, discussed with pt.  Per pt, [facility]

## 2012-01-17 NOTE — Telephone Encounter (Signed)
Done

## 2012-01-27 ENCOUNTER — Encounter: Payer: Self-pay | Admitting: Internal Medicine

## 2012-01-27 ENCOUNTER — Encounter: Payer: PRIVATE HEALTH INSURANCE | Admitting: *Deleted

## 2012-01-27 ENCOUNTER — Ambulatory Visit (INDEPENDENT_AMBULATORY_CARE_PROVIDER_SITE_OTHER): Payer: PRIVATE HEALTH INSURANCE | Admitting: *Deleted

## 2012-01-27 DIAGNOSIS — I509 Heart failure, unspecified: Secondary | ICD-10-CM

## 2012-01-27 DIAGNOSIS — I2589 Other forms of chronic ischemic heart disease: Secondary | ICD-10-CM

## 2012-01-27 LAB — ICD DEVICE OBSERVATION
AL AMPLITUDE: 4.5 mv
AL IMPEDENCE ICD: 400 Ohm
AL THRESHOLD: 1 V
ATRIAL PACING ICD: 83 pct
BAMS-0001: 180 {beats}/min
BAMS-0003: 70 {beats}/min
DEV-0020ICD: NEGATIVE
DEVICE MODEL ICD: 1042049
FVT: 0
HV IMPEDENCE: 65 Ohm
LV LEAD IMPEDENCE ICD: 475 Ohm
LV LEAD THRESHOLD: 0.875 V
MODE SWITCH EPISODES: 0
PACEART VT: 0
RV LEAD AMPLITUDE: 12 mv
RV LEAD IMPEDENCE ICD: 450 Ohm
RV LEAD THRESHOLD: 1.5 V
TOT-0006: 20130311000000
TOT-0007: 1
TOT-0008: 0
TOT-0009: 1
TOT-0010: 3
TZAT-0001SLOWVT: 1
TZAT-0004SLOWVT: 8
TZAT-0012SLOWVT: 200 ms
TZAT-0013SLOWVT: 2
TZAT-0018SLOWVT: NEGATIVE
TZAT-0019SLOWVT: 7.5 V
TZAT-0020SLOWVT: 1 ms
TZON-0003SLOWVT: 300 ms
TZON-0004SLOWVT: 30
TZON-0005SLOWVT: 6
TZON-0010SLOWVT: 40 ms
TZST-0001SLOWVT: 2
TZST-0001SLOWVT: 3
TZST-0001SLOWVT: 4
TZST-0001SLOWVT: 5
TZST-0003SLOWVT: 800 V
TZST-0003SLOWVT: 890 V
TZST-0003SLOWVT: 890 V
VENTRICULAR PACING ICD: 98 pct
VF: 0

## 2012-01-27 NOTE — Progress Notes (Signed)
ICD check with [employer] 

## 2012-01-28 ENCOUNTER — Other Ambulatory Visit: Payer: Self-pay | Admitting: *Deleted

## 2012-01-28 DIAGNOSIS — E7800 Pure hypercholesterolemia, unspecified: Secondary | ICD-10-CM

## 2012-01-28 MED ORDER — SIMVASTATIN 40 MG PO TABS: 60.0000 mg | ORAL_TABLET | Freq: Every evening | ORAL | Status: DC

## 2012-01-28 NOTE — Telephone Encounter (Signed)
Refill for simvastatin sent to [facility] 

## 2012-01-30 ENCOUNTER — Encounter: Payer: Self-pay | Admitting: *Deleted

## 2012-02-28 ENCOUNTER — Ambulatory Visit (INDEPENDENT_AMBULATORY_CARE_PROVIDER_SITE_OTHER): Payer: BC Managed Care – PPO | Admitting: Endocrinology

## 2012-02-28 VITALS — BP 128/68 | HR 89 | Wt 191.0 lb

## 2012-02-28 DIAGNOSIS — N189 Chronic kidney disease, unspecified: Secondary | ICD-10-CM

## 2012-02-28 DIAGNOSIS — E1122 Type 2 diabetes mellitus with diabetic chronic kidney disease: Secondary | ICD-10-CM

## 2012-02-28 DIAGNOSIS — E1129 Type 2 diabetes mellitus with other diabetic kidney complication: Secondary | ICD-10-CM

## 2012-02-28 LAB — HEMOGLOBIN A1C
Hgb A1c MFr Bld: 8.6 % — ABNORMAL HIGH (ref <5.7)
Mean Plasma Glucose: 200 mg/dL — ABNORMAL HIGH (ref <117)

## 2012-02-28 MED ORDER — INSULIN LISPRO 100 UNIT/ML ~~LOC~~ SOLN: SUBCUTANEOUS | Status: DC

## 2012-02-28 NOTE — Patient Instructions (Addendum)
check your blood sugar 3 times a day.  vary the time of day when you check, between before the 3 meals, and at bedtime.  also check if you have symptoms of your blood sugar being too high or too low.  please keep a record of the readings and bring it to your next appointment here.  please call us sooner if your blood sugar goes below 70, or if it stays over 200. Increase humalog to 3x a day (just before each meal) 16-7-14 units.   Please come back for a follow-up appointment in 3 months.  blood tests are being requested for you today.  We'll contact you with results.

## 2012-02-28 NOTE — Progress Notes (Signed)
Subjective:    Patient ID: Caleb Bell, male    DOB: July 17, 1955, 57 y.o.   MRN: [id number]  HPI pt returns for f/u of insulin-requiring DM (dx'ed 2003; complicated by renal insuff, peripheral sensory neuropathy, and CAD).  pt states he feels well in general.  no cbg record, but states cbg's are highest before lunch (high-100's).  It is lowest at hs.   Past Medical History  Diagnosis Date  . Hodgkin's disease(201)      Dx aprox 2004, s/p XRT-Chemo  . CAD (coronary artery disease)     CABG 2003  . Hyperlipidemia     low HDL  . DM type 2 (diabetes mellitus, type 2)   . Hypothyroidism   . Polysubstance     Alcohol, cocaine- resoloved for many years   . Atrial fibrillation     Paroxysmal, limited  . IVCD (intraventricular conduction defect)   . CRI (chronic renal insufficiency)      multifactorial... Dr. Allena Katz.. consult September 18, 2009  . Hyperkalemia     August, 2011, Aldactone, Aldactone stopped  . Chronic systolic heart failure     EF 62-13% echo 5- 2011  . Ischemic cardiomyopathy     MRI12/12 no real viability in hypo/akinetic segment  CAth native and graft disease  . Elevated serum creatinine   . ICD (implantable cardiac defibrillator) in place     CRT-D placed March, 2013  . Ejection fraction < 50%     EF 25%, echo, November, 2012  //   planning followup 2-D echo to assess LV function with CRT D.  device in place  . Personal history of colonic adenomas 12/24/2011    Past Surgical History  Procedure Date  . Tonsillectomy   . Coronary artery bypass graft 2003  . Cystectomy     Upper Back  . Cardiac defibrillator placement   . Colonoscopy 12/24/2011    Procedure: COLONOSCOPY;  Surgeon: Iva Boop, MD;  Location: [facility];  Service: Endoscopy;  Laterality: N/A;    History   Social History  . Marital Status: Single    Spouse Name: N/A    Number of Children: 0  . Years of Education: N/A   Occupational History  . musician    Social History Main Topics   . Smoking status: Never Smoker   . Smokeless tobacco: Never Used  . Alcohol Use: No     Comment: Hx of abuse/clean since 2003   . Drug Use: No     Comment: Hx of abuse/clean for many years  . Sexually Active: Not on file   Other Topics Concern  . 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 aspirin EC 81 MG tablet Take 1 tablet (81 mg total) by mouth daily.      . benazepril (LOTENSIN) 20 MG tablet Take 0.5 tablets (10 mg total) by mouth daily.  45 tablet  1  . carvedilol (COREG) 25 MG tablet Take 1 tablet (25 mg total) by mouth 2 (two) times daily with a meal.  180 tablet  1  . digoxin (LANOXIN) 0.125 MG tablet Take 1 tablet (0.125 mg total) by mouth daily.  90 tablet  1  . furosemide (LASIX) 40 MG tablet Take 1 tablet (40 mg total) by mouth daily.  90 tablet  1  . glucose blood (ONE TOUCH ULTRA TEST) test strip 1 each by Other route 2 (two) times daily. And lancets 2/day, 250.01  100 each  12  . Insulin Pen Needle 29G X MISC Pen needles of pt's choice  for Lantus Solostar  100 each  1  . levothyroxine (SYNTHROID, LEVOTHROID) 88 MCG tablet Take 1 tablet (88 mcg total) by mouth daily.  90 tablet  1  . rosuvastatin (CRESTOR) 40 MG tablet Take 1 tablet (40 mg total) by mouth daily.  90 tablet  1  . simvastatin (ZOCOR) 40 MG tablet Take 1.5 tablets (60 mg total) by mouth every evening.  135 tablet  1  . [DISCONTINUED] glimepiride (AMARYL) 4 MG tablet 1.5 tablet a day  30 tablet  11    No Known Allergies  Family History  Problem Relation Age of Onset  . Adopted: Yes    BP 128/68  Pulse 89  Wt 191 lb (86.637 kg)  SpO2 98%  Review of Systems denies hypoglycemia    Objective:   Physical Exam EXTEMITIES: no deformity.  no ulcer on the feet.  feet are of normal  color and temp.  no edema.  Old healed surgical scar (vein harvest) at the right leg. PULSES: dorsalis pedis intact bilat.   NEURO:  sensation is intact to touch on the feet, but decreased from normal.      Assessment & Plan:  DM: he needs increased rx

## 2012-02-29 LAB — MICROALBUMIN / CREATININE URINE RATIO
Creatinine, Urine: 91.1 mg/dL
Microalb Creat Ratio: 86.6 mg/g — ABNORMAL HIGH (ref 0.0–30.0)
Microalb, Ur: 7.89 mg/dL — ABNORMAL HIGH (ref 0.00–1.89)

## 2012-03-07 ENCOUNTER — Other Ambulatory Visit: Payer: Self-pay

## 2012-04-17 ENCOUNTER — Other Ambulatory Visit: Payer: Self-pay | Admitting: *Deleted

## 2012-04-17 DIAGNOSIS — E039 Hypothyroidism, unspecified: Secondary | ICD-10-CM

## 2012-04-17 DIAGNOSIS — I10 Essential (primary) hypertension: Secondary | ICD-10-CM

## 2012-04-17 DIAGNOSIS — I509 Heart failure, unspecified: Secondary | ICD-10-CM

## 2012-04-17 DIAGNOSIS — E7800 Pure hypercholesterolemia, unspecified: Secondary | ICD-10-CM

## 2012-04-17 DIAGNOSIS — I2589 Other forms of chronic ischemic heart disease: Secondary | ICD-10-CM

## 2012-04-17 DIAGNOSIS — R609 Edema, unspecified: Secondary | ICD-10-CM

## 2012-04-17 MED ORDER — LEVOTHYROXINE SODIUM 88 MCG PO TABS: 88.0000 ug | ORAL_TABLET | Freq: Every day | ORAL | Status: DC

## 2012-04-17 MED ORDER — ROSUVASTATIN CALCIUM 40 MG PO TABS: 40.0000 mg | ORAL_TABLET | Freq: Every day | ORAL | Status: DC

## 2012-04-17 MED ORDER — FUROSEMIDE 40 MG PO TABS: 40.0000 mg | ORAL_TABLET | Freq: Every day | ORAL | Status: DC

## 2012-04-17 MED ORDER — SIMVASTATIN 40 MG PO TABS: 60.0000 mg | ORAL_TABLET | Freq: Every evening | ORAL | Status: DC

## 2012-04-17 MED ORDER — BENAZEPRIL HCL 20 MG PO TABS: 10.0000 mg | ORAL_TABLET | Freq: Every day | ORAL | Status: DC

## 2012-04-17 MED ORDER — DIGOXIN 125 MCG PO TABS: 0.1250 mg | ORAL_TABLET | Freq: Every day | ORAL | Status: DC

## 2012-04-17 MED ORDER — CARVEDILOL 25 MG PO TABS: 25.0000 mg | ORAL_TABLET | Freq: Two times a day (BID) | ORAL | Status: DC

## 2012-04-17 MED ORDER — ASPIRIN EC 81 MG PO TBEC: 81.0000 mg | DELAYED_RELEASE_TABLET | Freq: Every day | ORAL | Status: DC

## 2012-04-17 NOTE — Telephone Encounter (Signed)
Refills for lotension, lasix, coreg, synthroid, aspirin, digoxin, zocor and crestor sent electronically to [facility] with 90 day supplies per pr request.

## 2012-04-27 ENCOUNTER — Ambulatory Visit (INDEPENDENT_AMBULATORY_CARE_PROVIDER_SITE_OTHER): Payer: BC Managed Care – PPO | Admitting: *Deleted

## 2012-04-27 ENCOUNTER — Encounter: Payer: Self-pay | Admitting: Internal Medicine

## 2012-04-27 ENCOUNTER — Other Ambulatory Visit: Payer: Self-pay

## 2012-04-27 DIAGNOSIS — Z95810 Presence of automatic (implantable) cardiac defibrillator: Secondary | ICD-10-CM

## 2012-04-27 DIAGNOSIS — I509 Heart failure, unspecified: Secondary | ICD-10-CM

## 2012-04-27 DIAGNOSIS — I2589 Other forms of chronic ischemic heart disease: Secondary | ICD-10-CM

## 2012-04-28 LAB — REMOTE ICD DEVICE
AL AMPLITUDE: 4 mv
AL IMPEDENCE ICD: 430 Ohm
AL THRESHOLD: 1 V
ATRIAL PACING ICD: 82 pct
BAMS-0001: 180 {beats}/min
BAMS-0003: 70 {beats}/min
DEV-0020ICD: NEGATIVE
DEVICE MODEL ICD: 1042049
HV IMPEDENCE: 66 Ohm
LV LEAD IMPEDENCE ICD: 510 Ohm
LV LEAD THRESHOLD: 0.75 V
RV LEAD AMPLITUDE: 12 mv
RV LEAD IMPEDENCE ICD: 410 Ohm
TZAT-0001SLOWVT: 1
TZAT-0004SLOWVT: 8
TZAT-0012SLOWVT: 200 ms
TZAT-0013SLOWVT: 2
TZAT-0018SLOWVT: NEGATIVE
TZAT-0019SLOWVT: 7.5 V
TZAT-0020SLOWVT: 1 ms
TZON-0003SLOWVT: 300 ms
TZON-0004SLOWVT: 30
TZON-0005SLOWVT: 6
TZON-0010SLOWVT: 40 ms
TZST-0001SLOWVT: 2
TZST-0001SLOWVT: 3
TZST-0001SLOWVT: 4
TZST-0001SLOWVT: 5
TZST-0003SLOWVT: 800 V
TZST-0003SLOWVT: 890 V
TZST-0003SLOWVT: 890 V
VENTRICULAR PACING ICD: 95 pct

## 2012-05-01 ENCOUNTER — Encounter: Payer: Self-pay | Admitting: Internal Medicine

## 2012-05-21 ENCOUNTER — Encounter: Payer: Self-pay | Admitting: *Deleted

## 2012-08-10 ENCOUNTER — Telehealth: Payer: Self-pay

## 2012-08-10 MED ORDER — INSULIN LISPRO 100 UNIT/ML ~~LOC~~ SOLN: SUBCUTANEOUS | Status: DC

## 2012-08-10 NOTE — Telephone Encounter (Signed)
Pt advised, rx refill sent to pahrmacy, pt states he will call back and make appt

## 2012-08-10 NOTE — Telephone Encounter (Signed)
Epic does not show any message. Please ask dr Drue Novel about epipen Please refill humalog x 1, as ov is due

## 2012-08-10 NOTE — Telephone Encounter (Signed)
Pt left voicemail stating, has has emailed you requesting a rx refill for Epi pen and Humalog did you receive emails? [phone]

## 2012-08-11 ENCOUNTER — Telehealth: Payer: Self-pay | Admitting: *Deleted

## 2012-08-11 NOTE — Telephone Encounter (Signed)
Patient left vm stating that he went to his pharmacy to pick up insulin quick pens. Instead patient received vials. Needs quick pens called in. Called pharmacy to change rx. Called patient to let him know.

## 2012-09-10 ENCOUNTER — Ambulatory Visit (INDEPENDENT_AMBULATORY_CARE_PROVIDER_SITE_OTHER): Payer: BC Managed Care – PPO | Admitting: Endocrinology

## 2012-09-10 ENCOUNTER — Encounter: Payer: Self-pay | Admitting: Endocrinology

## 2012-09-10 VITALS — BP 136/80 | HR 78 | Ht 73.0 in | Wt 188.0 lb

## 2012-09-10 DIAGNOSIS — E1122 Type 2 diabetes mellitus with diabetic chronic kidney disease: Secondary | ICD-10-CM

## 2012-09-10 DIAGNOSIS — N189 Chronic kidney disease, unspecified: Secondary | ICD-10-CM

## 2012-09-10 DIAGNOSIS — E1129 Type 2 diabetes mellitus with other diabetic kidney complication: Secondary | ICD-10-CM

## 2012-09-10 LAB — HEMOGLOBIN A1C: Hgb A1c MFr Bld: 7.8 % — ABNORMAL HIGH (ref 4.6–6.5)

## 2012-09-10 NOTE — Patient Instructions (Addendum)
check your blood sugar 3 times a day.  vary the time of day when you check, between before the 3 meals, and at bedtime.  also check if you have symptoms of your blood sugar being too high or too low.  please keep a record of the readings and bring it to your next appointment here.  please call us sooner if your blood sugar goes below 70, or if it stays over 200. please change humalog to 3x a day (just before each meal) 15-18-18 units.  You should take this insulin right before you eat, and only when you eat.   Please come back for a follow-up appointment in 3 months.  blood tests are being requested for you today.  We'll contact you with results. Please see dr Drue Novel about the episode of passing out.

## 2012-09-10 NOTE — Progress Notes (Signed)
Subjective:    Patient ID: Caleb Bell, male    DOB: [birth year], 56 y.o.   MRN: [id number]  HPI pt returns for f/u of insulin-requiring DM (dx'ed 2003; complicated by renal insuff, peripheral sensory neuropathy, and CAD).  pt states he feels well in general.  no cbg record, but states cbg's are sometimes low, usually before lunch.  It is highest at hs.  He takes humalog humalog to 3x a day (just before each meal) 16-18-18 units (however, he often does not eat breakfast).   Past Medical History  Diagnosis Date  . Hodgkin's disease      Dx aprox 2004, s/p XRT-Chemo  . CAD (coronary artery disease)     CABG 2003  . Hyperlipidemia     low HDL  . DM type 2 (diabetes mellitus, type 2)   . Hypothyroidism   . Polysubstance     Alcohol, cocaine- resoloved for many years   . Atrial fibrillation     Paroxysmal, limited  . IVCD (intraventricular conduction defect)   . CRI (chronic renal insufficiency)      multifactorial... Dr. Allena Katz.. consult September 18, 2009  . Hyperkalemia     August, 2011, Aldactone, Aldactone stopped  . Chronic systolic heart failure     EF 08-65% echo 5- 2011  . Ischemic cardiomyopathy     MRI12/12 no real viability in hypo/akinetic segment  CAth native and graft disease  . Elevated serum creatinine   . ICD (implantable cardiac defibrillator) in place     CRT-D placed March, 2013  . Ejection fraction < 50%     EF 25%, echo, November, 2012  //   planning followup 2-D echo to assess LV function with CRT D.  device in place  . Personal history of colonic adenomas 12/24/2011    Past Surgical History  Procedure Laterality Date  . Tonsillectomy    . Coronary artery bypass graft  2003  . Cystectomy      Upper Back  . Cardiac defibrillator placement    . Colonoscopy  12/24/2011    Procedure: COLONOSCOPY;  Surgeon: Caleb Boop, MD;  Location: [facility];  Service: Endoscopy;  Laterality: N/A;    History   Social History  . Marital Status: Single    Spouse  Name: N/A    Number of Children: 0  . Years of Education: N/A   Occupational History  . musician    Social History Main Topics  . Smoking status: Never Smoker   . Smokeless tobacco: Never Used  . Alcohol Use: No     Comment: Hx of abuse/clean since 2003   . Drug Use: No     Comment: Hx of abuse/clean for many years  . Sexual Activity: Not on file   Other Topics Concern  . Not on file   Social History Narrative   Regular Exercise-- yes (walking daily) --- diet: needs improvement    Musician   Single, no children   Patient Adopted--no family Hx                Current Outpatient Prescriptions on File Prior to Visit  Medication Sig Dispense Refill  . aspirin EC 81 MG tablet Take 1 tablet (81 mg total) by mouth daily.  90 tablet  3  . benazepril (LOTENSIN) 20 MG tablet Take 0.5 tablets (10 mg total) by mouth daily.  90 tablet  3  . carvedilol (COREG) 25 MG tablet Take 1 tablet (25 mg total) by mouth 2 (  two) times daily with a meal.  180 tablet  3  . digoxin (LANOXIN) 0.125 MG tablet Take 1 tablet (0.125 mg total) by mouth daily.  90 tablet  3  . glucose blood (ONE TOUCH ULTRA TEST) test strip 1 each by Other route 2 (two) times daily. And lancets 2/day, 250.01  100 each  12  . insulin lispro (HUMALOG) 100 UNIT/ML injection 3x a day (just before each meal) 16-7-14 units, and pen needles 3/day  45 mL  1  . Insulin Pen Needle 29G X MISC Pen needles of pt's choice  for Lantus Solostar  100 each  1  . levothyroxine (SYNTHROID, LEVOTHROID) 88 MCG tablet Take 1 tablet (88 mcg total) by mouth daily.  90 tablet  3  . rosuvastatin (CRESTOR) 40 MG tablet Take 1 tablet (40 mg total) by mouth daily.  90 tablet  3  . simvastatin (ZOCOR) 40 MG tablet Take 1.5 tablets (60 mg total) by mouth every evening.  135 tablet  3  . [DISCONTINUED] glimepiride (AMARYL) 4 MG tablet 1.5 tablet a day  30 tablet  11   No current facility-administered medications on file prior to visit.    No Known  Allergies  Family History  Problem Relation Age of Onset  . Adopted: Yes    BP 136/80  Pulse 78  Ht 6\' 1"  (1.854 m)  Wt 188 lb (85.276 kg)  BMI 24.81 kg/m2  SpO2 98%  Review of Systems He had 1 episode of LOC, which happened while urinating, approx 5 months ago.  He has lost a few lbs, due to his efforts    Objective:   Physical Exam VITAL SIGNS:  See vs page GENERAL: no distress  Lab Results  Component Value Date   HGBA1C 7.8* 09/10/2012      Assessment & Plan:  DM: he needs increased rx  This insulin regimen was chosen from multiple options, as it best matches his insulin to his changing requirements throughout the day.  The benefits of glycemic control must be weighed against the risks of hypoglycemia.   Renal insuff.  This increases the risk of hypoglycemia. LOC.  Does not sound DM-related

## 2012-09-14 ENCOUNTER — Other Ambulatory Visit: Payer: Self-pay | Admitting: Endocrinology

## 2012-10-28 ENCOUNTER — Telehealth: Payer: Self-pay | Admitting: Internal Medicine

## 2012-10-28 NOTE — Telephone Encounter (Signed)
10-28-12 lmm @ 414pm to set up past due defib check with dr Graciela Husbands due in June/mt

## 2012-11-04 ENCOUNTER — Ambulatory Visit (INDEPENDENT_AMBULATORY_CARE_PROVIDER_SITE_OTHER): Payer: BC Managed Care – PPO | Admitting: *Deleted

## 2012-11-04 DIAGNOSIS — I509 Heart failure, unspecified: Secondary | ICD-10-CM

## 2012-11-04 DIAGNOSIS — I4891 Unspecified atrial fibrillation: Secondary | ICD-10-CM

## 2012-11-04 DIAGNOSIS — I2589 Other forms of chronic ischemic heart disease: Secondary | ICD-10-CM

## 2012-11-04 LAB — ICD DEVICE OBSERVATION
AL AMPLITUDE: 4.7 mv
AL IMPEDENCE ICD: 410 Ohm
AL THRESHOLD: 1.125 V
ATRIAL PACING ICD: 84 pct
BAMS-0001: 180 {beats}/min
BAMS-0003: 70 {beats}/min
CHARGE TIME: 9.8 s
DEV-0020ICD: NEGATIVE
DEVICE MODEL ICD: 1042049
FVT: 0
HV IMPEDENCE: 53 Ohm
LV LEAD IMPEDENCE ICD: 450 Ohm
LV LEAD THRESHOLD: 0.75 V
MODE SWITCH EPISODES: 2
PACEART VT: 0
RV LEAD AMPLITUDE: 12 mv
RV LEAD IMPEDENCE ICD: 430 Ohm
RV LEAD THRESHOLD: 1.25 V
TZAT-0001SLOWVT: 1
TZAT-0004SLOWVT: 8
TZAT-0012SLOWVT: 200 ms
TZAT-0013SLOWVT: 2
TZAT-0018SLOWVT: NEGATIVE
TZAT-0019SLOWVT: 7.5 V
TZAT-0020SLOWVT: 1 ms
TZON-0003SLOWVT: 300 ms
TZON-0004SLOWVT: 30
TZON-0005SLOWVT: 6
TZON-0010SLOWVT: 40 ms
TZST-0001SLOWVT: 2
TZST-0001SLOWVT: 3
TZST-0001SLOWVT: 4
TZST-0001SLOWVT: 5
TZST-0003SLOWVT: 800 V
TZST-0003SLOWVT: 890 V
TZST-0003SLOWVT: 890 V
VENTRICULAR PACING ICD: 95 pct
VF: 0

## 2012-11-04 NOTE — Progress Notes (Signed)
CRT-D device check in office. Thresholds and sensing consistent with previous device measurements. Lead impedance trends stable over time. 2 mode switch episodes recorded (<1%)---max duration >10 mins, Peak A 394, Peak V 140---AFL. No ventricular arrhythmia episodes recorded. Patient bi-ventricularly pacing 95% of the time. Device programmed with appropriate safety margins. Heart failure diagnostics reviewed and trends are stable for patient.  No changes made this session. Estimated longevity 2-3.5 years.  Patient will follow up with SK in 3 months.

## 2012-11-09 ENCOUNTER — Telehealth: Payer: Self-pay

## 2012-11-09 NOTE — Telephone Encounter (Signed)
Medication List and allergies: reviewed  90 day supply/mail order: none Local prescriptions: [facility] [place]  Immunizations due: offered flu-declined  A/P:   Sees Dr Haynes Kerns for DM next appt 12/11/2102 No PSA on file CCS was 12/2010 with Dr Leone Payor; abnormal findings repeat 12/2014 No changes to PMH PSH FH SH  HM UTD WE flu vaccine  To Discuss with Provider: Will have questions by visit

## 2012-11-09 NOTE — Telephone Encounter (Signed)
error 

## 2012-11-11 ENCOUNTER — Encounter: Payer: Self-pay | Admitting: Internal Medicine

## 2012-11-11 ENCOUNTER — Ambulatory Visit (INDEPENDENT_AMBULATORY_CARE_PROVIDER_SITE_OTHER): Payer: BC Managed Care – PPO | Admitting: Internal Medicine

## 2012-11-11 VITALS — BP 109/72 | HR 96 | Temp 97.6°F | Ht 72.5 in | Wt 191.0 lb

## 2012-11-11 DIAGNOSIS — I2510 Atherosclerotic heart disease of native coronary artery without angina pectoris: Secondary | ICD-10-CM

## 2012-11-11 DIAGNOSIS — Z0000 Encounter for general adult medical examination without abnormal findings: Secondary | ICD-10-CM

## 2012-11-11 DIAGNOSIS — E785 Hyperlipidemia, unspecified: Secondary | ICD-10-CM

## 2012-11-11 DIAGNOSIS — Z2911 Encounter for prophylactic immunotherapy for respiratory syncytial virus (RSV): Secondary | ICD-10-CM

## 2012-11-11 DIAGNOSIS — Z23 Encounter for immunization: Secondary | ICD-10-CM

## 2012-11-11 DIAGNOSIS — E039 Hypothyroidism, unspecified: Secondary | ICD-10-CM

## 2012-11-11 LAB — CBC WITH DIFFERENTIAL/PLATELET
Basophils Absolute: 0 10*3/uL (ref 0.0–0.1)
Basophils Relative: 0.4 % (ref 0.0–3.0)
Eosinophils Absolute: 0.3 10*3/uL (ref 0.0–0.7)
Eosinophils Relative: 4 % (ref 0.0–5.0)
HCT: 37.1 % — ABNORMAL LOW (ref 39.0–52.0)
Hemoglobin: 12.7 g/dL — ABNORMAL LOW (ref 13.0–17.0)
Lymphocytes Relative: 21.2 % (ref 12.0–46.0)
Lymphs Abs: 1.6 10*3/uL (ref 0.7–4.0)
MCHC: 34.3 g/dL (ref 30.0–36.0)
MCV: 81.4 fl (ref 78.0–100.0)
Monocytes Absolute: 0.5 10*3/uL (ref 0.1–1.0)
Monocytes Relative: 6.4 % (ref 3.0–12.0)
Neutro Abs: 5.1 10*3/uL (ref 1.4–7.7)
Neutrophils Relative %: 68 % (ref 43.0–77.0)
Platelets: 180 10*3/uL (ref 150.0–400.0)
RBC: 4.56 Mil/uL (ref 4.22–5.81)
RDW: 13.6 % (ref 11.5–14.6)
WBC: 7.5 10*3/uL (ref 4.5–10.5)

## 2012-11-11 LAB — LDL CHOLESTEROL, DIRECT: Direct LDL: 66.3 mg/dL

## 2012-11-11 LAB — LIPID PANEL
Cholesterol: 134 mg/dL (ref 0–200)
HDL: 34.1 mg/dL — ABNORMAL LOW (ref >39.00)
Total CHOL/HDL Ratio: 4
Triglycerides: 272 mg/dL — ABNORMAL HIGH (ref 0.0–149.0)
VLDL: 54.4 mg/dL — ABNORMAL HIGH (ref 0.0–40.0)

## 2012-11-11 LAB — COMPREHENSIVE METABOLIC PANEL
ALT: 26 U/L (ref 0–53)
AST: 20 U/L (ref 0–37)
Albumin: 4.3 g/dL (ref 3.5–5.2)
Alkaline Phosphatase: 70 U/L (ref 39–117)
BUN: 40 mg/dL — ABNORMAL HIGH (ref 6–23)
CO2: 28 mEq/L (ref 19–32)
Calcium: 9.7 mg/dL (ref 8.4–10.5)
Chloride: 101 mEq/L (ref 96–112)
Creatinine, Ser: 1.5 mg/dL (ref 0.4–1.5)
GFR: 51.45 mL/min — ABNORMAL LOW (ref >60.00)
Glucose, Bld: 200 mg/dL — ABNORMAL HIGH (ref 70–99)
Potassium: 4.4 mEq/L (ref 3.5–5.1)
Sodium: 137 mEq/L (ref 135–145)
Total Bilirubin: 0.7 mg/dL (ref 0.3–1.2)
Total Protein: 7.4 g/dL (ref 6.0–8.3)

## 2012-11-11 LAB — TSH: TSH: 0.67 u[IU]/mL (ref 0.35–5.50)

## 2012-11-11 LAB — PSA: PSA: 2.38 ng/mL (ref 0.10–4.00)

## 2012-11-11 NOTE — Assessment & Plan Note (Addendum)
Pt is taking both Crestor and simvastatin, apparently a misunderstanding, advise was to take only Crestor. Plan: LFTs Discontinue simvastatin

## 2012-11-11 NOTE — Progress Notes (Signed)
Subjective:    Patient ID: Caleb Bell, male    DOB: 1955/08/08, 57 y.o.   MRN: [id number]  HPI CPX  Past Medical History  Diagnosis Date  . Hodgkin's disease      Dx aprox 2004, s/p XRT-Chemo  . CAD (coronary artery disease)     CABG 2003  . Hyperlipidemia     low HDL  . DM type 2 (diabetes mellitus, type 2)   . Hypothyroidism   . Polysubstance     Alcohol, cocaine- resoloved for many years   . Atrial fibrillation     Paroxysmal, limited  . IVCD (intraventricular conduction defect)   . CRI (chronic renal insufficiency)      multifactorial... Dr. Allena Katz.. consult September 18, 2009  . Hyperkalemia     August, 2011, Aldactone, Aldactone stopped  . Chronic systolic heart failure     EF 40-98% echo 5- 2011  . Ischemic cardiomyopathy     MRI12/12 no real viability in hypo/akinetic segment  CAth native and graft disease  . Elevated serum creatinine   . ICD (implantable cardiac defibrillator) in place     CRT-D placed March, 2013  . Ejection fraction < 50%     EF 25%, echo, November, 2012  //   planning followup 2-D echo to assess LV function with CRT D.  device in place  . Personal history of colonic adenomas 12/24/2011   Past Surgical History  Procedure Laterality Date  . Tonsillectomy    . Coronary artery bypass graft  2003  . Cystectomy      Upper Back  . Cardiac defibrillator placement    . Colonoscopy  12/24/2011    Procedure: COLONOSCOPY;  Surgeon: Iva Boop, MD;  Location: [facility];  Service: Endoscopy;  Laterality: N/A;   History   Social History  . Marital Status: Single    Spouse Name: N/A    Number of Children: 0  . Years of Education: N/A   Occupational History  . musician    Social History Main Topics  . Smoking status: Never Smoker   . Smokeless tobacco: Never Used  . Alcohol Use: No     Comment: Hx of abuse/clean since 2003   . Drug Use: No     Comment: Hx of abuse/clean for many years  . Sexual Activity: Not on file   Other Topics  Concern  . 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 Adopted: Yes     Review of Systems Had a syncopal episode several months ago: Went to the bathroom at night, felt dizzy and lost consciousness, does not know for how long, there was no associated chest pain, palpitations, headaches or major injury. No tongue bite. No further events No  CP, SOB, lower extremity edema Denies  nausea, vomiting diarrhea Denies  blood in the stools No dysuria, gross hematuria, difficulty urinating   No anxiety, depression Denies suicidal ideas     Objective:   Physical Exam BP 109/72  Pulse 96  Temp(Src) 97.6 F (36.4 C)  Ht 6' 0.5" (1.842 m)  Wt 191 lb (86.637 kg)  BMI 25.53 kg/m2  SpO2 100% General -- alert, well-developed, NAD.  Neck --no thyromegaly Lungs -- normal  respiratory effort, no intercostal retractions, no accessory muscle use, and normal breath sounds.  Heart-- normal rate, regular rhythm, no murmur.  Abdomen-- Not distended, good bowel sounds,soft, non-tender. Rectal-- No external abnormalities noted. Normal sphincter tone. No rectal masses or tenderness. Brown stool, Hemoccult negative  Prostate--Prostate gland firm and smooth, no enlargement, nodularity, tenderness, mass, asymmetry or induration. Extremities-- no pretibial edema bilaterally  Neurologic--  alert & oriented X3. Speech normal, gait normal, strength normal in all extremities.  Psych-- Cognition and judgment appear intact. Cooperative with normal attention span and concentration. No anxious appearing , no depressed appearing.     Assessment & Plan:

## 2012-11-11 NOTE — Assessment & Plan Note (Addendum)
Td 03 08-2010 Pneumonia and flu shots, strongly declined, explained benefits zostavax -- discussed and provided today Labs  Diet exercise discussed Cscope 12-2011, next 3 years

## 2012-11-11 NOTE — Assessment & Plan Note (Signed)
History of syncope a few months ago, did not seek further medical attention, EKG today paced. Will arrange a routine cardiology visit.

## 2012-11-11 NOTE — Assessment & Plan Note (Signed)
Good compliance of medication, check labs 

## 2012-11-20 ENCOUNTER — Telehealth: Payer: Self-pay | Admitting: Endocrinology

## 2012-11-20 ENCOUNTER — Other Ambulatory Visit: Payer: Self-pay | Admitting: Endocrinology

## 2012-11-20 ENCOUNTER — Other Ambulatory Visit: Payer: Self-pay | Admitting: *Deleted

## 2012-11-20 MED ORDER — INSULIN LISPRO 100 UNIT/ML ~~LOC~~ SOLN: SUBCUTANEOUS | Status: DC

## 2012-11-26 ENCOUNTER — Other Ambulatory Visit: Payer: Self-pay

## 2012-11-27 ENCOUNTER — Encounter: Payer: Self-pay | Admitting: Cardiology

## 2012-11-30 ENCOUNTER — Encounter: Payer: Self-pay | Admitting: Cardiology

## 2012-11-30 ENCOUNTER — Ambulatory Visit (INDEPENDENT_AMBULATORY_CARE_PROVIDER_SITE_OTHER): Payer: BC Managed Care – PPO | Admitting: Cardiology

## 2012-11-30 VITALS — BP 100/54 | HR 73 | Ht 73.0 in | Wt 191.0 lb

## 2012-11-30 DIAGNOSIS — Z95810 Presence of automatic (implantable) cardiac defibrillator: Secondary | ICD-10-CM

## 2012-11-30 DIAGNOSIS — I509 Heart failure, unspecified: Secondary | ICD-10-CM

## 2012-11-30 DIAGNOSIS — N259 Disorder resulting from impaired renal tubular function, unspecified: Secondary | ICD-10-CM

## 2012-11-30 DIAGNOSIS — I2589 Other forms of chronic ischemic heart disease: Secondary | ICD-10-CM

## 2012-11-30 DIAGNOSIS — I2510 Atherosclerotic heart disease of native coronary artery without angina pectoris: Secondary | ICD-10-CM

## 2012-11-30 DIAGNOSIS — I5022 Chronic systolic (congestive) heart failure: Secondary | ICD-10-CM

## 2012-11-30 NOTE — Patient Instructions (Signed)
**Note De-identified Caleb Bell Obfuscation** Your physician recommends that you continue on your current medications as directed. Please refer to the Current Medication list given to you today.  Your physician wants you to follow-up in: 1 year. You will receive a reminder letter in the mail two months in advance. If you don't receive a letter, please call our office to schedule the follow-up appointment.  

## 2012-11-30 NOTE — Assessment & Plan Note (Signed)
Patient is being followed by the nephrology team. His diuretic dose was decreased and he is stable with this.

## 2012-11-30 NOTE — Assessment & Plan Note (Signed)
Coronary disease is stable. 

## 2012-11-30 NOTE — Assessment & Plan Note (Signed)
The patient's volume status is stable. He and I had a lengthy discussion about the approach to salt and fluid. I explained to him that overall he needs to be careful with his total salt and fluid. However on the day that he is performing in the sunshine with the potential for significant fluid loss, it is safe and appropriate for him to use fluids including electrolyte-containing fluids.  As part of today's evaluation I spent greater than 25 minutes for this total care. More than half of this time was with direct contact with him discussing all of the issues including his rhythm and his prior syncope and his volume status.

## 2012-11-30 NOTE — Assessment & Plan Note (Addendum)
The patient's ICD is in place. I have requested that it be interrogated today so that we can see if he had an event this past Saturday.............. his ICD was interrogated. He did not have any significant arrhythmias. No further workup.

## 2012-11-30 NOTE — Progress Notes (Signed)
HPI  The patient is seen today to followup his coronary disease and cardiomyopathy. 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 Several months ago he get up to urinate in the middle of the night and when coming out of the bathroom he had a syncopal episode. There was a minor injury to his leg. Over time he has had some mild dizziness. It sounds like this may be a volume issue. His ICD was interrogated in November 04, 2012. Since then he did have an episode during which he thinks that he may have received some type of shock.  He had his CRT D implanted early in 2013. Later in the year followup 2-D echo showed that his ejection fraction remained 25%. He has not had shortness of breath or chest pain. He is on appropriate medications. He was unable to tolerate Aldactone because of hyperkalemia.  No Known Allergies  Current Outpatient Prescriptions  Medication Sig Dispense Refill  . aspirin EC 81 MG tablet Take 1 tablet (81 mg total) by mouth daily.  90 tablet  3  . benazepril (LOTENSIN) 20 MG tablet Take 0.5 tablets (10 mg total) by mouth daily.  90 tablet  3  . carvedilol (COREG) 25 MG tablet Take 1 tablet (25 mg total) by mouth 2 (two) times daily with a meal.  180 tablet  3  . digoxin (LANOXIN) 0.125 MG tablet Take 1 tablet (0.125 mg total) by mouth daily.  90 tablet  3  . furosemide (LASIX) 40 MG tablet Take 20 mg by mouth daily.      . insulin lispro (HUMALOG) 100 UNIT/ML injection 3x a day (just before each meal) 15-20-20 units, and pen needles 3/day  20 mL  3  . Insulin Pen Needle 29G X MISC Pen needles of pt's choice  for Lantus Solostar  100 each  1  . levothyroxine (SYNTHROID, LEVOTHROID) 88 MCG tablet Take 1 tablet (88 mcg total) by mouth daily.  90 tablet  3  . ONE TOUCH ULTRA TEST test strip Follow directions provided by physician twice daily  50 each  11  . ONETOUCH DELICA LANCETS FINE MISC Follow directions provided by physician twice daily  100 each  11  . rosuvastatin (CRESTOR) 40 MG  tablet Take 1 tablet (40 mg total) by mouth daily.  90 tablet  3  . [DISCONTINUED] glimepiride (AMARYL) 4 MG tablet 1.5 tablet a day  30 tablet  11   No current facility-administered medications for this visit.    History   Social History  . Marital Status: Single    Spouse Name: N/A    Number of Children: 0  . Years of Education: N/A   Occupational History  . musician    Social History Main Topics  . Smoking status: Never Smoker   . Smokeless tobacco: Never Used  . Alcohol Use: No     Comment: Hx of abuse/clean since 2003   . Drug Use: No     Comment: Hx of abuse/clean for many years  . Sexual Activity: Not on file   Other Topics Concern  . 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 Adopted: Yes    Past Medical History  Diagnosis Date  . Hodgkin's disease      Dx aprox 2004, s/p XRT-Chemo  . CAD (coronary artery disease)     CABG 2003  . Hyperlipidemia     low HDL  . DM type 2 (diabetes mellitus, type 2)   . Hypothyroidism   . Polysubstance     Alcohol, cocaine- resoloved for many years   . Atrial fibrillation     Paroxysmal, limited  . IVCD (intraventricular conduction defect)   . CRI (chronic renal insufficiency)      multifactorial... Dr. Allena Lianny Molter.. consult September 18, 2009  . Hyperkalemia     August, 2011, Aldactone, Aldactone stopped  . Chronic systolic heart failure     EF 95-62% echo 5- 2011  . Ischemic cardiomyopathy     MRI12/12 no real viability in hypo/akinetic segment  CAth native and graft disease  . Elevated serum creatinine   . ICD (implantable cardiac defibrillator) in place     CRT-D placed March, 2013  . Ejection fraction < 50%     EF 25%, echo, November, 2012  //   planning followup 2-D echo to assess LV function with CRT D.  device in place    . Personal history of colonic adenomas 12/24/2011    Past Surgical History  Procedure Laterality Date  . Tonsillectomy    . Coronary artery bypass graft  2003  . Cystectomy      Upper Back  . Cardiac defibrillator placement    . Colonoscopy  12/24/2011    Procedure: COLONOSCOPY;  Surgeon: Iva Boop, MD;  Location: [facility];  Service: Endoscopy;  Laterality: N/A;    Patient Active Problem List   Diagnosis Date Noted  . Personal history of colonic adenomas 12/24/2011  . Ejection fraction < 50%   . ICD-St.Jude   . CAD (coronary artery disease) 11/26/2010  . Annual physical exam 08/28/2010  . Hx of radiation therapy   . Hyperkalemia   . RENAL INSUFFICIENCY 05/25/2009  . INCREASED BLOOD PRESSURE 02/17/2008  . ERECTILE DYSFUNCTION 02/03/2008  . HYPERLIPIDEMIA 05/12/2007  . HYPOTHYROIDISM 02/15/2006  . Diabetes mellitus with chronic kidney disease 02/15/2006  . Ischemic cardiomyopathy-status post CABG x5 2003 02/15/2006  . CONGESTIVE HEART FAILURE 02/15/2006  . HX, PERSONAL, HODGKIN'S DISEASE 02/15/2006  . Atrial fibrillation 02/15/2006    ROS   Patient denies fever, chills, headache, sweats, rash, change in vision, change in hearing, chest pain, cough, nausea vomiting, urinary symptoms. All other systems are reviewed and are negative.  PHYSICAL EXAM  Patient is oriented to person time and place. Affect is normal. There is no jugulovenous distention. Lungs are clear. Respiratory effort is nonlabored. Cardiac exam reveals S1 and S2. There no clicks or significant murmurs. The abdomen is soft. There is no peripheral edema. There are no musculoskeletal deformities. There are no skin rashes.  Filed Vitals:   11/30/12 1443  BP: 100/54  Pulse: 73  Height: 6\' 1"  (1.854 m)  Weight: 191 lb (86.637 kg)     ASSESSMENT & PLAN

## 2012-11-30 NOTE — Assessment & Plan Note (Signed)
Patient is on good doses of ACE inhibitor and beta blocker. He is on a small dose of furosemide and he is on digoxin. He did not tolerate Aldactone. He had hyperkalemia. Over time we may consider this again. However this may not be prudent with his renal insufficiency.

## 2012-12-01 ENCOUNTER — Encounter: Payer: Self-pay | Admitting: Internal Medicine

## 2012-12-10 ENCOUNTER — Encounter: Payer: Self-pay | Admitting: Endocrinology

## 2012-12-10 ENCOUNTER — Ambulatory Visit (INDEPENDENT_AMBULATORY_CARE_PROVIDER_SITE_OTHER): Payer: BC Managed Care – PPO | Admitting: Endocrinology

## 2012-12-10 VITALS — BP 110/60 | HR 80 | Temp 98.0°F | Resp 16 | Ht 73.0 in | Wt 194.4 lb

## 2012-12-10 DIAGNOSIS — E1122 Type 2 diabetes mellitus with diabetic chronic kidney disease: Secondary | ICD-10-CM

## 2012-12-10 DIAGNOSIS — N189 Chronic kidney disease, unspecified: Secondary | ICD-10-CM

## 2012-12-10 DIAGNOSIS — E1129 Type 2 diabetes mellitus with other diabetic kidney complication: Secondary | ICD-10-CM

## 2012-12-10 LAB — HEMOGLOBIN A1C: Hgb A1c MFr Bld: 8.4 % — ABNORMAL HIGH (ref 4.6–6.5)

## 2012-12-10 MED ORDER — INSULIN ASPART 100 UNIT/ML FLEXPEN: PEN_INJECTOR | SUBCUTANEOUS | Status: DC

## 2012-12-10 NOTE — Patient Instructions (Addendum)
check your blood sugar 3 times a day.  vary the time of day when you check, between before the 3 meals, and at bedtime.  also check if you have symptoms of your blood sugar being too high or too low.  please keep a record of the readings and bring it to your next appointment here.  please call us sooner if your blood sugar goes below 70, or if it stays over 200.   please change humalog to novolog, 3x a day (just before each meal) 15-20-30 units.  You should take this insulin right before you eat, and only when you eat.   Please come back for a follow-up appointment in 3 months.  blood tests are being requested for you today.  We'll contact you with results.

## 2012-12-10 NOTE — Progress Notes (Signed)
Subjective:    Patient ID: Caleb Bell, male    DOB: March 17, 1955, 57 y.o.   MRN: [id number]  HPI pt returns for f/u of insulin-requiring DM (dx'ed 2003, when he presented with MI; complicated by renal insuff, peripheral sensory neuropathy, and CAD; he has never had severe hypoglycemia or DKA).  pt states he feels well in general. no cbg record, but states cbg's are highest (200) at hs, and in am.   Past Medical History  Diagnosis Date  . Hodgkin's disease      Dx aprox 2004, s/p XRT-Chemo  . CAD (coronary artery disease)     CABG 2003  . Hyperlipidemia     low HDL  . DM type 2 (diabetes mellitus, type 2)   . Hypothyroidism   . Polysubstance     Alcohol, cocaine- resoloved for many years   . Atrial fibrillation     Paroxysmal, limited  . IVCD (intraventricular conduction defect)   . CRI (chronic renal insufficiency)      multifactorial... Dr. Allena Katz.. consult September 18, 2009  . Hyperkalemia     August, 2011, Aldactone, Aldactone stopped  . Chronic systolic heart failure     EF 40-98% echo 5- 2011  . Ischemic cardiomyopathy     MRI12/12 no real viability in hypo/akinetic segment  CAth native and graft disease  . Elevated serum creatinine   . ICD (implantable cardiac defibrillator) in place     CRT-D placed March, 2013  . Ejection fraction < 50%     EF 25%, echo, November, 2012  //   planning followup 2-D echo to assess LV function with CRT D.  device in place  . Personal history of colonic adenomas 12/24/2011    Past Surgical History  Procedure Laterality Date  . Tonsillectomy    . Coronary artery bypass graft  2003  . Cystectomy      Upper Back  . Cardiac defibrillator placement    . Colonoscopy  12/24/2011    Procedure: COLONOSCOPY;  Surgeon: Iva Boop, MD;  Location: [facility];  Service: Endoscopy;  Laterality: N/A;    History   Social History  . Marital Status: Single    Spouse Name: N/A    Number of Children: 0  . Years of Education: N/A    Occupational History  . musician    Social History Main Topics  . Smoking status: Never Smoker   . Smokeless tobacco: Never Used  . Alcohol Use: No     Comment: Hx of abuse/clean since 2003   . Drug Use: No     Comment: Hx of abuse/clean for many years  . Sexual Activity: Not on file   Other Topics Concern  . 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 aspirin EC 81 MG tablet Take 1 tablet (81 mg total) by mouth daily.  90 tablet  3  . benazepril (LOTENSIN) 20 MG tablet Take 0.5 tablets (10 mg total) by mouth daily.  90 tablet  3  . carvedilol (COREG) 25 MG tablet Take 1 tablet (25 mg total) by mouth 2 (two) times daily with a meal.  180 tablet  3  . digoxin (LANOXIN) 0.125  MG tablet Take 1 tablet (0.125 mg total) by mouth daily.  90 tablet  3  . furosemide (LASIX) 40 MG tablet Take 20 mg by mouth daily.      Marland Kitchen levothyroxine (SYNTHROID, LEVOTHROID) 88 MCG tablet Take 1 tablet (88 mcg total) by mouth daily.  90 tablet  3  . ONE TOUCH ULTRA TEST test strip Follow directions provided by physician twice daily  50 each  11  . ONETOUCH DELICA LANCETS FINE MISC Follow directions provided by physician twice daily  100 each  11  . rosuvastatin (CRESTOR) 40 MG tablet Take 1 tablet (40 mg total) by mouth daily.  90 tablet  3  . [DISCONTINUED] glimepiride (AMARYL) 4 MG tablet 1.5 tablet a day  30 tablet  11   No current facility-administered medications on file prior to visit.    No Known Allergies  Family History  Problem Relation Age of Onset  . Adopted: Yes    BP 110/60  Pulse 80  Temp(Src) 98 F (36.7 C) (Oral)  Resp 16  Ht 6\' 1"  (1.854 m)  Wt 194 lb 6.4 oz (88.179 kg)  BMI 25.65 kg/m2  Review of Systems He denies hypoglycemia and  weight change.      Objective:   Physical Exam VITAL SIGNS:  See vs page GENERAL: no distress  Lab Results  Component Value Date   HGBA1C 8.4* 12/10/2012      Assessment & Plan:  DM: he needs increased rx  This insulin regimen was chosen from multiple options, as it best matches his insulin to his changing requirements throughout the day.  The benefits of glycemic control must be weighed against the risks of hypoglycemia.   Renal insuff.  This increases the risk of hypoglycemia.

## 2012-12-11 MED ORDER — INSULIN LISPRO 100 UNIT/ML (KWIKPEN): PEN_INJECTOR | SUBCUTANEOUS | Status: DC

## 2012-12-14 ENCOUNTER — Telehealth: Payer: Self-pay | Admitting: *Deleted

## 2012-12-14 MED ORDER — INSULIN ASPART 100 UNIT/ML FLEXPEN: PEN_INJECTOR | SUBCUTANEOUS | Status: DC

## 2012-12-14 NOTE — Telephone Encounter (Signed)
Ok, i have sent a prescription to your pharmacy  

## 2012-12-14 NOTE — Telephone Encounter (Signed)
[employer] will not cover novolin insulin originally ordered.  Per Dr. Everardo All, change to humalog insulin.  Contacted patient, he states that [employer] prefers Novolog, not humalog.  Informed him I would clarify & recontact him.  Please advise.

## 2012-12-14 NOTE — Telephone Encounter (Signed)
Notified patient to contact his pharmacy

## 2013-02-01 ENCOUNTER — Telehealth: Payer: Self-pay

## 2013-02-01 NOTE — Telephone Encounter (Signed)
ok 

## 2013-02-01 NOTE — Telephone Encounter (Signed)
PA received for Humalog preferred drug is Novalog. Ok to change, Thanks!

## 2013-02-10 ENCOUNTER — Encounter: Payer: Self-pay | Admitting: *Deleted

## 2013-02-25 ENCOUNTER — Encounter: Payer: Self-pay | Admitting: Endocrinology

## 2013-02-25 ENCOUNTER — Ambulatory Visit (INDEPENDENT_AMBULATORY_CARE_PROVIDER_SITE_OTHER): Payer: BC Managed Care – PPO | Admitting: Endocrinology

## 2013-02-25 VITALS — BP 116/60 | HR 115 | Temp 98.6°F | Ht 73.0 in | Wt 195.0 lb

## 2013-02-25 DIAGNOSIS — E1122 Type 2 diabetes mellitus with diabetic chronic kidney disease: Secondary | ICD-10-CM

## 2013-02-25 DIAGNOSIS — N189 Chronic kidney disease, unspecified: Secondary | ICD-10-CM

## 2013-02-25 DIAGNOSIS — E1129 Type 2 diabetes mellitus with other diabetic kidney complication: Secondary | ICD-10-CM

## 2013-02-25 LAB — HEMOGLOBIN A1C: Hgb A1c MFr Bld: 7.4 % — ABNORMAL HIGH (ref 4.6–6.5)

## 2013-02-25 NOTE — Patient Instructions (Addendum)
check your blood sugar 3 times a day.  vary the time of day when you check, between before the 3 meals, and at bedtime.  also check if you have symptoms of your blood sugar being too high or too low.  please keep a record of the readings and bring it to your next appointment here.  please call us sooner if your blood sugar goes below 70, or if it stays over 200.   Please continue novolog, 3x a day (just before each meal) 11-09-28 units.  You should take this insulin right before you eat, and only when you eat.   Please come back for a follow-up appointment in 3 months.  blood tests are being requested for you today.  We'll contact you with results.

## 2013-02-25 NOTE — Progress Notes (Signed)
Subjective:    Patient ID: Caleb Bell, male    DOB: Feb 05, 1955, 58 y.o.   MRN: [id number]  HPI pt returns for f/u of insulin-requiring DM (dx'ed 2003, when he presented with MI; complicated by renal insuff, peripheral sensory neuropathy, foot ulcer, and CAD; he has never had severe hypoglycemia or DKA; he has been on insulin since 2010; he takes multiple daily injections).  pt states he feels well in general.  He says he seldom misses the insulin.  He takes only 10 units with breakfast, due to mild hypoglycemia after breakfast.  no cbg record, but states cbg's are "better overall."  He says it seldom goes over 200.  It is in general higher as the day goes on.   Past Medical History  Diagnosis Date  . Hodgkin's disease      Dx aprox 2004, s/p XRT-Chemo  . CAD (coronary artery disease)     CABG 2003  . Hyperlipidemia     low HDL  . DM type 2 (diabetes mellitus, type 2)   . Hypothyroidism   . Polysubstance     Alcohol, cocaine- resoloved for many years   . Atrial fibrillation     Paroxysmal, limited  . IVCD (intraventricular conduction defect)   . CRI (chronic renal insufficiency)      multifactorial... Dr. Posey Pronto.. consult September 18, 2009  . Hyperkalemia     August, 2011, Aldactone, Aldactone stopped  . Chronic systolic heart failure     EF 20-25% echo 5- 2011  . Ischemic cardiomyopathy     MRI12/12 no real viability in hypo/akinetic segment  CAth native and graft disease  . Elevated serum creatinine   . ICD (implantable cardiac defibrillator) in place     CRT-D placed March, 2013  . Ejection fraction < 50%     EF 25%, echo, November, 2012  //   planning followup 2-D echo to assess LV function with CRT D.  device in place  . Personal history of colonic adenomas 12/24/2011    Past Surgical History  Procedure Laterality Date  . Tonsillectomy    . Coronary artery bypass graft  2003  . Cystectomy      Upper Back  . Cardiac defibrillator placement    . Colonoscopy   12/24/2011    Procedure: COLONOSCOPY;  Surgeon: [name], MD;  Location: [facility];  Service: Endoscopy;  Laterality: N/A;    History   Social History  . Marital Status: Single    Spouse Name: N/A    Number of Children: 0  . Years of Education: N/A   Occupational History  . musician    Social History Main Topics  . Smoking status: Never Smoker   . Smokeless tobacco: Never Used  . Alcohol Use: No     Comment: Hx of abuse/clean since 2003   . Drug Use: No     Comment: Hx of abuse/clean for many years  . Sexual Activity: Not on file   Other Topics Concern  . 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 aspirin EC 81 MG tablet Take 1 tablet (81 mg total) by mouth daily.  90 tablet  3  .  benazepril (LOTENSIN) 20 MG tablet Take 0.5 tablets (10 mg total) by mouth daily.  90 tablet  3  . carvedilol (COREG) 25 MG tablet Take 1 tablet (25 mg total) by mouth 2 (two) times daily with a meal.  180 tablet  3  . digoxin (LANOXIN) 0.125 MG tablet Take 1 tablet (0.125 mg total) by mouth daily.  90 tablet  3  . furosemide (LASIX) 40 MG tablet Take 20 mg by mouth daily.      . insulin aspart (NOVOLOG FLEXPEN) 100 UNIT/ML SOPN FlexPen 3x a day (just before each meal) 15-20-30 units, and pen needles 3/day  10 pen  11  . levothyroxine (SYNTHROID, LEVOTHROID) 88 MCG tablet Take 1 tablet (88 mcg total) by mouth daily.  90 tablet  3  . ONE TOUCH ULTRA TEST test strip Follow directions provided by physician twice daily  50 each  11  . ONETOUCH DELICA LANCETS FINE MISC Follow directions provided by physician twice daily  100 each  11  . rosuvastatin (CRESTOR) 40 MG tablet Take 1 tablet (40 mg total) by mouth daily.  90 tablet  3  . [DISCONTINUED] glimepiride  (AMARYL) 4 MG tablet 1.5 tablet a day  30 tablet  11   No current facility-administered medications on file prior to visit.    No Known Allergies  Family History  Problem Relation Age of Onset  . Adopted: Yes    BP 116/60  Pulse 115  Temp(Src) 98.6 F (37 C) (Oral)  Ht 6\' 1"  (1.854 m)  Wt 195 lb (88.451 kg)  BMI 25.73 kg/m2  SpO2 96%  Review of Systems Denies weight change and LOC.      Objective:   Physical Exam VITAL SIGNS:  See vs page GENERAL: no distress  Lab Results  Component Value Date   HGBA1C 7.4* 02/25/2013      Assessment & Plan:  DM: he needs increased rx  This insulin regimen was chosen from multiple options, as it best matches his insulin to his changing requirements throughout the day.  The benefits of glycemic control must be weighed against the risks of hypoglycemia.   Renal insuff.  This increases the risk of hypoglycemia.

## 2013-03-09 ENCOUNTER — Encounter: Payer: Self-pay | Admitting: Internal Medicine

## 2013-03-17 ENCOUNTER — Encounter: Payer: BC Managed Care – PPO | Admitting: Internal Medicine

## 2013-03-24 ENCOUNTER — Other Ambulatory Visit: Payer: Self-pay

## 2013-03-24 ENCOUNTER — Telehealth: Payer: Self-pay | Admitting: Endocrinology

## 2013-03-24 MED ORDER — INSULIN ASPART 100 UNIT/ML FLEXPEN: PEN_INJECTOR | SUBCUTANEOUS | Status: DC

## 2013-03-24 NOTE — Telephone Encounter (Signed)
Last time he saw dr Loanne Drilling he increased his dosage. He will be out of insulin tomorrow.

## 2013-03-24 NOTE — Telephone Encounter (Signed)
Done

## 2013-03-25 ENCOUNTER — Telehealth: Payer: Self-pay | Admitting: *Deleted

## 2013-03-25 MED ORDER — INSULIN ASPART 100 UNIT/ML FLEXPEN: PEN_INJECTOR | SUBCUTANEOUS | Status: DC

## 2013-03-25 NOTE — Telephone Encounter (Signed)
Script changed to most current insulin regimen.

## 2013-03-31 ENCOUNTER — Ambulatory Visit (INDEPENDENT_AMBULATORY_CARE_PROVIDER_SITE_OTHER): Payer: BC Managed Care – PPO | Admitting: Internal Medicine

## 2013-03-31 ENCOUNTER — Encounter: Payer: Self-pay | Admitting: Internal Medicine

## 2013-03-31 ENCOUNTER — Telehealth: Payer: Self-pay | Admitting: Internal Medicine

## 2013-03-31 VITALS — BP 134/73 | HR 66 | Ht 73.0 in | Wt 196.0 lb

## 2013-03-31 DIAGNOSIS — I255 Ischemic cardiomyopathy: Secondary | ICD-10-CM

## 2013-03-31 DIAGNOSIS — I4891 Unspecified atrial fibrillation: Secondary | ICD-10-CM

## 2013-03-31 DIAGNOSIS — I455 Other specified heart block: Secondary | ICD-10-CM

## 2013-03-31 DIAGNOSIS — I454 Nonspecific intraventricular block: Secondary | ICD-10-CM

## 2013-03-31 DIAGNOSIS — I2589 Other forms of chronic ischemic heart disease: Secondary | ICD-10-CM

## 2013-03-31 DIAGNOSIS — Z95810 Presence of automatic (implantable) cardiac defibrillator: Secondary | ICD-10-CM

## 2013-03-31 LAB — MDC_IDC_ENUM_SESS_TYPE_INCLINIC
Battery Remaining Longevity: 38.4 mo
Brady Statistic RA Percent Paced: 83 %
Brady Statistic RV Percent Paced: 98 %
Date Time Interrogation Session: 20150311101551
HighPow Impedance: 66.375
Implantable Pulse Generator Model: 3257
Implantable Pulse Generator Serial Number: 1042049
Lead Channel Impedance Value: 387.5 Ohm
Lead Channel Impedance Value: 412.5 Ohm
Lead Channel Impedance Value: 475 Ohm
Lead Channel Pacing Threshold Amplitude: 0.625 V
Lead Channel Pacing Threshold Amplitude: 1.125 V
Lead Channel Pacing Threshold Amplitude: 1.25 V
Lead Channel Pacing Threshold Pulse Width: 0.5 ms
Lead Channel Pacing Threshold Pulse Width: 0.5 ms
Lead Channel Pacing Threshold Pulse Width: 0.8 ms
Lead Channel Sensing Intrinsic Amplitude: 12 mV
Lead Channel Sensing Intrinsic Amplitude: 4.1 mV
Lead Channel Setting Pacing Amplitude: 2 V
Lead Channel Setting Pacing Amplitude: 2.125
Lead Channel Setting Pacing Amplitude: 2.5 V
Lead Channel Setting Pacing Pulse Width: 0.5 ms
Lead Channel Setting Pacing Pulse Width: 0.8 ms
Lead Channel Setting Sensing Sensitivity: 0.5 mV
Zone Setting Detection Interval: 250 ms
Zone Setting Detection Interval: 300 ms

## 2013-03-31 NOTE — Progress Notes (Signed)
Patient Care Team: Colon Branch, MD as PCP - [facility] Dana. Posey Pronto, MD (Nephrology) Deboraha Sprang, MD (Cardiology)   HPI  Caleb Bell is a 58 y.o. male Seen in followup for ischemic cardiomyopathy congestive heart failure and an IVCD with a broad QRS status post CRT-D implantation (March 2013).   Echocardiogram 10/13 demonstrated no interval improvement with ejection fraction of 20-25%   He also has a history of paroxysmal atrial fibrillation and renal insufficiency. His history of Hodgkin's disease and is status post chemotherapy and radiation therapy  The patient denies chest pain, shortness of breath, nocturnal dyspnea, orthopnea or peripheral edema. There have been no palpitations, lightheadedness or syncope.         Past Medical History  Diagnosis Date  . Hodgkin's disease      Dx aprox 2004, s/p XRT-Chemo  . CAD (coronary artery disease)     CABG 2003  . Hyperlipidemia     low HDL  . DM type 2 (diabetes mellitus, type 2)   . Hypothyroidism   . Polysubstance     Alcohol, cocaine- resoloved for many years   . Atrial fibrillation     Paroxysmal, limited  . IVCD (intraventricular conduction defect)   . CRI (chronic renal insufficiency)      multifactorial... Dr. Posey Pronto.. consult September 18, 2009  . Hyperkalemia     August, 2011, Aldactone, Aldactone stopped  . Chronic systolic heart failure     EF 20-25% echo 5- 2011  . Ischemic cardiomyopathy     MRI12/12 no real viability in hypo/akinetic segment  CAth native and graft disease  . Elevated serum creatinine   . ICD (implantable cardiac defibrillator) in place     CRT-D placed March, 2013  . Ejection fraction < 50%     EF 25%, echo, November, 2012  //   planning followup 2-D echo to assess LV function with CRT D.  device in place  . Personal history of colonic adenomas 12/24/2011    Past Surgical History  Procedure Laterality Date  . Tonsillectomy    . Coronary artery bypass graft  2003  .  Cystectomy      Upper Back  . Cardiac defibrillator placement    . Colonoscopy  12/24/2011    Procedure: COLONOSCOPY;  Surgeon: [name], MD;  Location: [facility];  Service: Endoscopy;  Laterality: N/A;    Current Outpatient Prescriptions  Medication Sig Dispense Refill  . aspirin EC 81 MG tablet Take 1 tablet (81 mg total) by mouth daily.  90 tablet  3  . benazepril (LOTENSIN) 20 MG tablet Take 0.5 tablets (10 mg total) by mouth daily.  90 tablet  3  . carvedilol (COREG) 25 MG tablet Take 1 tablet (25 mg total) by mouth 2 (two) times daily with a meal.  180 tablet  3  . digoxin (LANOXIN) 0.125 MG tablet Take 1 tablet (0.125 mg total) by mouth daily.  90 tablet  3  . furosemide (LASIX) 40 MG tablet Take 20 mg by mouth daily.      . insulin aspart (NOVOLOG FLEXPEN) 100 UNIT/ML FlexPen 3x a day (just before each meal) 11-14-33 units, and pen needles 3/day  25 pen  0  . levothyroxine (SYNTHROID, LEVOTHROID) 88 MCG tablet Take 1 tablet (88 mcg total) by mouth daily.  90 tablet  3  . NOVOFINE 32G X 6 MM MISC As directed      . ONE TOUCH ULTRA TEST  test strip Follow directions provided by physician twice daily  50 each  11  . ONETOUCH DELICA LANCETS FINE MISC Follow directions provided by physician twice daily  100 each  11  . rosuvastatin (CRESTOR) 40 MG tablet Take 1 tablet (40 mg total) by mouth daily.  90 tablet  3  . [DISCONTINUED] glimepiride (AMARYL) 4 MG tablet 1.5 tablet a day  30 tablet  11   No current facility-administered medications for this visit.    No Known Allergies  Review of Systems negative except from HPI and PMH  Physical Exam BP 134/73  Pulse 66  Ht 6\' 1"  (1.854 m)  Wt 196 lb (88.905 kg)  BMI 25.86 kg/m2 Well developed and well nourished in no acute distress HENT normal E scleral and icterus clear Neck Supple JVP flat; carotids brisk and full Clear to ausculation The patient's device was interrogated.  The information was reviewed. No changes were  made in the programming.     Regular rate and rhythm, no murmurs gallops or rub Soft with active bowel sounds No clubbing cyanosis none Edema Alert and oriented, grossly normal motor and sensory function Skin Warm and Dry  ECG demonstrates P. Synchronous pacing with a QRS duration 170 this is a significant widening of ECG following post implant Assessment and  Plan  Atrial fibrillation  Ischemic cardiomyopathy  Congestive heart failure-chronic systolic  CRT-D.-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 We have discussed anticoagulation. He will check with his pharmacy regarding the various costs of the NOACs. We have discussed the risks and benefits compared to warfarin as well as evidence on apixaban in the [employer] trial comparing  risk of bleeding to aspirin    Concern also about the difference in the electrocardiogram. It turns out that he is RV first by 40. By making nonsimultaneous we are able to markedly short the QRS duration .  We'll check a digoxin level and otherwise continue current medications.

## 2013-03-31 NOTE — Patient Instructions (Addendum)
Your physician recommends that you return for lab work today: Digoxin level  Remote monitoring is used to monitor your Pacemaker of ICD from home. This monitoring reduces the number of office visits required to check your device to one time per year. It allows [place] to keep an eye on the functioning of your device to ensure it is working properly. You are scheduled for a device check from home on 07/05/13. You may send your transmission at any time that day. If you have a wireless device, the transmission will be sent automatically. After your physician reviews your transmission, you will receive a postcard with your next transmission date.  Your physician wants you to follow-up in: 1 year with Dr. Caryl Comes.  You will receive a reminder letter in the mail two months in advance. If you don't receive a letter, please call our office to schedule the follow-up appointment.   These are the NOACs (novel oral anticoagulants) to talk with your pharmacist about:  Eliquis  Xarelto   Pradaxa

## 2013-03-31 NOTE — Telephone Encounter (Signed)
New problem   Pt was told to call back concerning taking new medications. Please call pt

## 2013-04-01 ENCOUNTER — Telehealth: Payer: Self-pay | Admitting: Internal Medicine

## 2013-04-01 LAB — DIGOXIN LEVEL: Digoxin Level: 0.8 ng/mL (ref 0.8–2.0)

## 2013-04-01 NOTE — Telephone Encounter (Signed)
New message  Patient was given options to check into for insurance coverage on medications:  Eliquist $  65.00 Xerotol  $ 45.00 Prodaxa$ 65.00  Cost is not a matter to him, he will take which ever one that Dr. Caryl Comes recommends.Please call & advise.

## 2013-04-02 ENCOUNTER — Other Ambulatory Visit: Payer: Self-pay | Admitting: Internal Medicine

## 2013-04-02 ENCOUNTER — Other Ambulatory Visit: Payer: Self-pay | Admitting: *Deleted

## 2013-04-02 MED ORDER — APIXABAN 5 MG PO TABS: 5.0000 mg | ORAL_TABLET | Freq: Two times a day (BID) | ORAL | Status: DC

## 2013-04-02 NOTE — Telephone Encounter (Signed)
Starting Eliquis. Stopping Aspirin.  Rx sent to [facility] on [address] per pt request. Pt agreeable to plan.

## 2013-04-02 NOTE — Telephone Encounter (Signed)
Follow up    Patient calling stating someone just called him 2 minutes ago .

## 2013-04-02 NOTE — Telephone Encounter (Signed)
Duplicate note  See 3/40 telephone note about this matter

## 2013-04-07 ENCOUNTER — Other Ambulatory Visit: Payer: Self-pay | Admitting: *Deleted

## 2013-04-07 MED ORDER — CARVEDILOL 25 MG PO TABS: ORAL_TABLET | ORAL | Status: DC

## 2013-04-12 ENCOUNTER — Encounter: Payer: BC Managed Care – PPO | Admitting: Internal Medicine

## 2013-04-26 ENCOUNTER — Other Ambulatory Visit: Payer: Self-pay | Admitting: Internal Medicine

## 2013-05-03 ENCOUNTER — Other Ambulatory Visit: Payer: Self-pay | Admitting: *Deleted

## 2013-05-03 DIAGNOSIS — I10 Essential (primary) hypertension: Secondary | ICD-10-CM

## 2013-05-03 MED ORDER — BENAZEPRIL HCL 20 MG PO TABS: ORAL_TABLET | ORAL | Status: DC

## 2013-05-25 ENCOUNTER — Encounter: Payer: Self-pay | Admitting: Endocrinology

## 2013-05-25 ENCOUNTER — Ambulatory Visit (INDEPENDENT_AMBULATORY_CARE_PROVIDER_SITE_OTHER): Payer: BC Managed Care – PPO | Admitting: Endocrinology

## 2013-05-25 VITALS — BP 118/80 | HR 103 | Temp 97.7°F | Ht 73.0 in | Wt 190.0 lb

## 2013-05-25 DIAGNOSIS — N189 Chronic kidney disease, unspecified: Secondary | ICD-10-CM

## 2013-05-25 DIAGNOSIS — E1129 Type 2 diabetes mellitus with other diabetic kidney complication: Secondary | ICD-10-CM

## 2013-05-25 DIAGNOSIS — E1122 Type 2 diabetes mellitus with diabetic chronic kidney disease: Secondary | ICD-10-CM

## 2013-05-25 LAB — MICROALBUMIN / CREATININE URINE RATIO
Creatinine,U: 81.1 mg/dL
Microalb Creat Ratio: 5.2 mg/g (ref 0.0–30.0)
Microalb, Ur: 4.2 mg/dL — ABNORMAL HIGH (ref 0.0–1.9)

## 2013-05-25 LAB — HEMOGLOBIN A1C: Hgb A1c MFr Bld: 6.8 % — ABNORMAL HIGH (ref 4.6–6.5)

## 2013-05-25 NOTE — Patient Instructions (Addendum)
check your blood sugar 3 times a day.  vary the time of day when you check, between before the 3 meals, and at bedtime.  also check if you have symptoms of your blood sugar being too high or too low.  please keep a record of the readings and bring it to your next appointment here.  please call us sooner if your blood sugar goes below 70, or if it stays over 200.   Please change novolog to 3x a day (just before each meal) 11-10-38 units.  You should take this insulin right before you eat, and only when you eat.   Please come back for a follow-up appointment in 3 months.  blood and urine tests are being requested for you today.  We'll contact you with results.

## 2013-05-25 NOTE — Progress Notes (Signed)
Subjective:    Patient ID: Caleb Bell, male    DOB: 10-05-1955, 58 y.o.   MRN: [id number]  HPI pt returns for f/u of insulin-requiring DM (dx'ed 2003, when he presented with MI; he has moderate  neuropathy of the lower extremities; he has assoc renal insuff, peripheral sensory neuropathy, foot ulcer, retinopathy, and CAD; he has never had severe hypoglycemia or DKA; he has been on insulin since 2010; he takes multiple daily injections).  pt states he feels well in general.  He says he seldom misses the insulin.  no cbg record, but states cbg's are lowest in the afternoon, and highest at hs.   Past Medical History  Diagnosis Date  . Hodgkin's disease      Dx aprox 2004, s/p XRT-Chemo  . CAD (coronary artery disease)     CABG 2003  . Hyperlipidemia     low HDL  . DM type 2 (diabetes mellitus, type 2)   . Hypothyroidism   . Polysubstance     Alcohol, cocaine- resoloved for many years   . Atrial fibrillation     Paroxysmal, limited  . IVCD (intraventricular conduction defect)   . CRI (chronic renal insufficiency)      multifactorial... Dr. Posey Pronto.. consult September 18, 2009  . Hyperkalemia     August, 2011, Aldactone, Aldactone stopped  . Chronic systolic heart failure     EF 20-25% echo 5- 2011  . Ischemic cardiomyopathy     MRI12/12 no real viability in hypo/akinetic segment  CAth native and graft disease  . Elevated serum creatinine   . ICD (implantable cardiac defibrillator) in place     CRT-D placed March, 2013  . Ejection fraction < 50%     EF 25%, echo, November, 2012  //   planning followup 2-D echo to assess LV function with CRT D.  device in place  . Personal history of colonic adenomas 12/24/2011    Past Surgical History  Procedure Laterality Date  . Tonsillectomy    . Coronary artery bypass graft  2003  . Cystectomy      Upper Back  . Cardiac defibrillator placement    . Colonoscopy  12/24/2011    Procedure: COLONOSCOPY;  Surgeon: [name], MD;   Location: [facility];  Service: Endoscopy;  Laterality: N/A;    History   Social History  . Marital Status: Single    Spouse Name: N/A    Number of Children: 0  . Years of Education: N/A   Occupational History  . musician    Social History Main Topics  . Smoking status: Never Smoker   . Smokeless tobacco: Never Used  . Alcohol Use: No     Comment: Hx of abuse/clean since 2003   . Drug Use: No     Comment: Hx of abuse/clean for many years  . Sexual Activity: Not on file   Other Topics Concern  . 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 apixaban (ELIQUIS) 5 MG TABS tablet Take 1 tablet (5 mg total) by mouth 2 (two) times daily.  180 tablet  3  . benazepril (LOTENSIN) 20 MG tablet Take 0.5 tablets (10 mg total)  by mouth daily. DUE for office visit please schedule > [phone]  90 tablet  0  . carvedilol (COREG) 25 MG tablet Take one tablet by mouth twice daily  with food  180 tablet  0  . digoxin (LANOXIN) 0.125 MG tablet Take one tablet daily. DUE for appointment [phone] please schedule.  90 tablet  0  . furosemide (LASIX) 40 MG tablet Take 20 mg by mouth daily.      Marland Kitchen levothyroxine (SYNTHROID, LEVOTHROID) 88 MCG tablet Take one tablet by mouth daily. Please schedule follow-up appointment for additional refills> [phone]  90 tablet  0  . NOVOFINE 32G X 6 MM MISC As directed      . ONE TOUCH ULTRA TEST test strip Follow directions provided by physician twice daily  50 each  11  . ONETOUCH DELICA LANCETS FINE MISC Follow directions provided by physician twice daily  100 each  11  . rosuvastatin (CRESTOR) 40 MG tablet Take one tablet daily . DUE for appointment [phone]  90 tablet  0  . [DISCONTINUED] glimepiride (AMARYL) 4  MG tablet 1.5 tablet a day  30 tablet  11   No current facility-administered medications on file prior to visit.    No Known Allergies  Family History  Problem Relation Age of Onset  . Adopted: Yes    BP 118/80  Pulse 103  Temp(Src) 97.7 F (36.5 C) (Oral)  Ht 6\' 1"  (1.854 m)  Wt 190 lb (86.183 kg)  BMI 25.07 kg/m2  SpO2 97%  Review of Systems He denies hypoglycemia and weight change.     Objective:   Physical Exam VITAL SIGNS:  See vs page GENERAL: no distress   Lab Results  Component Value Date   HGBA1C 6.8* 05/25/2013      Assessment & Plan:  DM: The pattern of his cbg's indicates he needs some adjustment in his therapy.  This insulin regimen was chosen from multiple options, as it best matches his insulin to his changing requirements throughout the day.  The benefits of glycemic control must be weighed against the risks of hypoglycemia.   Renal insuff.  This increases the risk of hypoglycemia.

## 2013-07-04 ENCOUNTER — Other Ambulatory Visit: Payer: Self-pay | Admitting: Internal Medicine

## 2013-07-05 ENCOUNTER — Ambulatory Visit (INDEPENDENT_AMBULATORY_CARE_PROVIDER_SITE_OTHER): Payer: BC Managed Care – PPO | Admitting: *Deleted

## 2013-07-05 ENCOUNTER — Encounter: Payer: Self-pay | Admitting: Internal Medicine

## 2013-07-05 DIAGNOSIS — I509 Heart failure, unspecified: Secondary | ICD-10-CM

## 2013-07-05 DIAGNOSIS — I2589 Other forms of chronic ischemic heart disease: Secondary | ICD-10-CM

## 2013-07-05 DIAGNOSIS — I5022 Chronic systolic (congestive) heart failure: Secondary | ICD-10-CM

## 2013-07-05 DIAGNOSIS — I255 Ischemic cardiomyopathy: Secondary | ICD-10-CM

## 2013-07-05 LAB — MDC_IDC_ENUM_SESS_TYPE_REMOTE
Battery Remaining Longevity: 36 mo
Brady Statistic RA Percent Paced: 87 %
Brady Statistic RV Percent Paced: 98 %
HighPow Impedance: 63 Ohm
Implantable Pulse Generator Model: 3257
Implantable Pulse Generator Serial Number: 1042049
Lead Channel Impedance Value: 380 Ohm
Lead Channel Impedance Value: 380 Ohm
Lead Channel Impedance Value: 450 Ohm
Lead Channel Pacing Threshold Amplitude: 0.625 V
Lead Channel Pacing Threshold Amplitude: 1.125 V
Lead Channel Pacing Threshold Pulse Width: 0.5 ms
Lead Channel Pacing Threshold Pulse Width: 0.5 ms
Lead Channel Sensing Intrinsic Amplitude: 12 mV
Lead Channel Sensing Intrinsic Amplitude: 3.4 mV
Lead Channel Setting Pacing Amplitude: 2 V
Lead Channel Setting Pacing Amplitude: 2.125
Lead Channel Setting Pacing Amplitude: 2.5 V
Lead Channel Setting Pacing Pulse Width: 0.5 ms
Lead Channel Setting Pacing Pulse Width: 0.8 ms
Lead Channel Setting Sensing Sensitivity: 0.5 mV
Zone Setting Detection Interval: 250 ms
Zone Setting Detection Interval: 300 ms

## 2013-07-05 NOTE — Telephone Encounter (Signed)
Called and scheduled follow up with patient

## 2013-07-07 NOTE — Progress Notes (Signed)
Remote ICD transmission.   

## 2013-07-13 ENCOUNTER — Ambulatory Visit
Admission: RE | Admit: 2013-07-13 | Discharge: 2013-07-13 | Disposition: A | Discharge status: Home / Self Care | Payer: BC Managed Care – PPO | Source: Ambulatory Visit | Attending: Chiropractic Medicine | Admitting: Chiropractic Medicine

## 2013-07-13 ENCOUNTER — Other Ambulatory Visit: Payer: Self-pay | Admitting: Chiropractic Medicine

## 2013-07-13 DIAGNOSIS — G8929 Other chronic pain: Secondary | ICD-10-CM

## 2013-07-13 DIAGNOSIS — M2560 Stiffness of unspecified joint, not elsewhere classified: Secondary | ICD-10-CM

## 2013-07-13 DIAGNOSIS — M542 Cervicalgia: Principal | ICD-10-CM

## 2013-07-19 ENCOUNTER — Encounter: Payer: Self-pay | Admitting: Internal Medicine

## 2013-07-19 ENCOUNTER — Ambulatory Visit (INDEPENDENT_AMBULATORY_CARE_PROVIDER_SITE_OTHER): Payer: BC Managed Care – PPO | Admitting: Internal Medicine

## 2013-07-19 VITALS — BP 121/75 | HR 100 | Temp 98.0°F | Wt 197.0 lb

## 2013-07-19 DIAGNOSIS — E039 Hypothyroidism, unspecified: Secondary | ICD-10-CM

## 2013-07-19 DIAGNOSIS — N259 Disorder resulting from impaired renal tubular function, unspecified: Secondary | ICD-10-CM

## 2013-07-19 DIAGNOSIS — E785 Hyperlipidemia, unspecified: Secondary | ICD-10-CM

## 2013-07-19 NOTE — Assessment & Plan Note (Addendum)
Last visit with nephrology 03-2013, note reviewed , creatinine was 1.5.

## 2013-07-19 NOTE — Assessment & Plan Note (Signed)
Good compliance with medications, check a TSH 

## 2013-07-19 NOTE — Assessment & Plan Note (Addendum)
Since the last time I saw him few months ago, saw cardiology, no further syncope

## 2013-07-19 NOTE — Progress Notes (Signed)
Subjective:    Patient ID: Caleb Bell, male    DOB: 09/09/1955, 58 y.o.   MRN: [id number]  DOS:  07/19/2013 Type of  Visit: Routine History: Here for hypothyroidism management, good medication compliance. He was also seen with a syncope, since then he has seen cardiology and seems to be stable. No further syncope spells   ROS Denies chest pain, difficulty breathing, lower extremity edema or orthopnea. Good compliance with all medications, occasionally has low blood sugars, issue already discussed with endocrinology.   Past Medical History  Diagnosis Date  . Hodgkin's disease      Dx aprox 2004, s/p XRT-Chemo  . CAD (coronary artery disease)     CABG 2003  . Hyperlipidemia     low HDL  . DM type 2 (diabetes mellitus, type 2)   . Hypothyroidism   . Polysubstance     Alcohol, cocaine- resoloved for many years   . Atrial fibrillation     Paroxysmal, limited  . IVCD (intraventricular conduction defect)   . CRI (chronic renal insufficiency)      multifactorial... Dr. Posey Pronto.. consult September 18, 2009  . Hyperkalemia     August, 2011, Aldactone, Aldactone stopped  . Chronic systolic heart failure     EF 20-25% echo 5- 2011  . Ischemic cardiomyopathy     MRI12/12 no real viability in hypo/akinetic segment  CAth native and graft disease  . Elevated serum creatinine   . ICD (implantable cardiac defibrillator) in place     CRT-D placed March, 2013  . Ejection fraction < 50%     EF 25%, echo, November, 2012  //   planning followup 2-D echo to assess LV function with CRT D.  device in place  . Personal history of colonic adenomas 12/24/2011    Past Surgical History  Procedure Laterality Date  . Tonsillectomy    . Coronary artery bypass graft  2003  . Cystectomy      Upper Back  . Cardiac defibrillator placement    . Colonoscopy  12/24/2011    Procedure: COLONOSCOPY;  Surgeon: [name], MD;  Location: [facility];  Service: Endoscopy;  Laterality: N/A;     History   Social History  . Marital Status: Single    Spouse Name: N/A    Number of Children: 0  . Years of Education: N/A   Occupational History  . musician    Social History Main Topics  . Smoking status: Never Smoker   . Smokeless tobacco: Never Used  . Alcohol Use: No     Comment: Hx of abuse/clean since 2003   . Drug Use: No     Comment: Hx of abuse/clean for many years  . Sexual Activity: Not on file   Other Topics Concern  . 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  Always use your most recent med list.               apixaban 5 MG Tabs tablet  Commonly known as:  ELIQUIS  Take 1 tablet (5 mg total) by mouth 2 (two) times daily.     benazepril 20  MG tablet  Commonly known as:  LOTENSIN  Take 0.5 tablets (10 mg total) by mouth daily. DUE for office visit please schedule > [phone]     carvedilol 25 MG tablet  Commonly known as:  COREG  TAKE ONE TABLET BY MOUTH TWICE DAILY with food.     digoxin 0.125 MG tablet  Commonly known as:  LANOXIN  TAKE ONE TABLET BY MOUTH ONE TIME DAILY. Due for an appointment.     furosemide 40 MG tablet  Commonly known as:  LASIX  Take 20 mg by mouth daily.     insulin aspart 100 UNIT/ML FlexPen  Commonly known as:  NOVOLOG  3x a day (just before each meal) 11-10-38 units, and pen needles 3/day     levothyroxine 88 MCG tablet  Commonly known as:  SYNTHROID, LEVOTHROID  TAKE ONE TABLET BY MOUTH ONE TIME DAILY. Please schedule appointment for refills.     NOVOFINE 32G X 6 MM Misc  Generic drug:  Insulin Pen Needle  As directed     ONE TOUCH ULTRA TEST test strip  Generic drug:  glucose blood  Follow directions provided by physician twice daily     ONETOUCH DELICA LANCETS FINE Misc   Follow directions provided by physician twice daily     rosuvastatin 40 MG tablet  Commonly known as:  CRESTOR  Take one tablet daily . DUE for appointment [phone]           Objective:   Physical Exam BP 121/75  Pulse 100  Temp(Src) 98 F (36.7 C)  Wt 197 lb (89.359 kg)  SpO2 96% General -- alert, well-developed, NAD.   Lungs -- normal respiratory effort, no intercostal retractions, no accessory muscle use, and normal breath sounds.  Heart-- normal rate, regular rhythm, no murmur.   Extremities-- no pretibial edema bilaterally  Neurologic--  alert & oriented X3. Speech normal, gait appropriate for age, strength symmetric and appropriate for age.  Psych-- Cognition and judgment appear intact. Cooperative with normal attention span and concentration. No anxious or depressed appearing.     Assessment & Plan:

## 2013-07-19 NOTE — Progress Notes (Signed)
Pre visit review using our clinic review tool, if applicable. No additional management support is needed unless otherwise documented below in the visit note. 

## 2013-07-19 NOTE — Assessment & Plan Note (Signed)
On Crestor, good  compliance, no apparent side effects, patient is not fasting, will check an FLP when he comes back. Last LDL satisfactory.

## 2013-07-19 NOTE — Patient Instructions (Signed)
Get your blood work before you leave   Next visit is for a physical exam in 6 months , fasting Please make an appointment    

## 2013-07-20 LAB — TSH: TSH: 0.63 u[IU]/mL (ref 0.35–4.50)

## 2013-07-27 ENCOUNTER — Encounter: Payer: Self-pay | Admitting: Cardiology

## 2013-07-29 ENCOUNTER — Other Ambulatory Visit: Payer: Self-pay | Admitting: Internal Medicine

## 2013-08-02 ENCOUNTER — Other Ambulatory Visit: Payer: Self-pay

## 2013-08-02 MED ORDER — INSULIN ASPART 100 UNIT/ML FLEXPEN: PEN_INJECTOR | SUBCUTANEOUS | Status: DC

## 2013-08-13 ENCOUNTER — Encounter: Payer: Self-pay | Admitting: Endocrinology

## 2013-08-13 ENCOUNTER — Ambulatory Visit (INDEPENDENT_AMBULATORY_CARE_PROVIDER_SITE_OTHER): Payer: BC Managed Care – PPO | Admitting: Endocrinology

## 2013-08-13 VITALS — BP 122/76 | HR 103 | Temp 98.7°F | Ht 73.0 in | Wt 194.0 lb

## 2013-08-13 DIAGNOSIS — E1022 Type 1 diabetes mellitus with diabetic chronic kidney disease: Secondary | ICD-10-CM

## 2013-08-13 DIAGNOSIS — E1029 Type 1 diabetes mellitus with other diabetic kidney complication: Secondary | ICD-10-CM

## 2013-08-13 DIAGNOSIS — N189 Chronic kidney disease, unspecified: Secondary | ICD-10-CM

## 2013-08-13 LAB — HEMOGLOBIN A1C: Hgb A1c MFr Bld: 6.9 % — ABNORMAL HIGH (ref 4.6–6.5)

## 2013-08-13 NOTE — Progress Notes (Signed)
Subjective:    Patient ID: Caleb Bell, male    DOB: 08-16-1955, 58 y.o.   MRN: [id number]  HPI pt returns for f/u of insulin-requiring DM (dx'ed 2003, when he presented with MI; he has moderate  neuropathy of the lower extremities; he has assoc renal insuff, peripheral sensory neuropathy, foot ulcer, retinopathy, and CAD; he has never had severe hypoglycemia or DKA; he has been on insulin since 2010; he takes multiple daily injections).  pt states he feels well in general.  no cbg record, but states cbg's are highest in the afternoon.  Past Medical History  Diagnosis Date  . Hodgkin's disease      Dx aprox 2004, s/p XRT-Chemo  . CAD (coronary artery disease)     CABG 2003  . Hyperlipidemia     low HDL  . DM type 2 (diabetes mellitus, type 2)   . Hypothyroidism   . Polysubstance     Alcohol, cocaine- resoloved for many years   . Atrial fibrillation     Paroxysmal, limited  . IVCD (intraventricular conduction defect)   . CRI (chronic renal insufficiency)      multifactorial... Dr. Posey Pronto.. consult September 18, 2009  . Hyperkalemia     August, 2011, Aldactone, Aldactone stopped  . Chronic systolic heart failure     EF 20-25% echo 5- 2011  . Ischemic cardiomyopathy     MRI12/12 no real viability in hypo/akinetic segment  CAth native and graft disease  . Elevated serum creatinine   . ICD (implantable cardiac defibrillator) in place     CRT-D placed March, 2013  . Ejection fraction < 50%     EF 25%, echo, November, 2012  //   planning followup 2-D echo to assess LV function with CRT D.  device in place  . Personal history of colonic adenomas 12/24/2011    Past Surgical History  Procedure Laterality Date  . Tonsillectomy    . Coronary artery bypass graft  2003  . Cystectomy      Upper Back  . Cardiac defibrillator placement    . Colonoscopy  12/24/2011    Procedure: COLONOSCOPY;  Surgeon: [name], MD;  Location: [facility];  Service: Endoscopy;  Laterality: N/A;     History   Social History  . Marital Status: Single    Spouse Name: N/A    Number of Children: 0  . Years of Education: N/A   Occupational History  . musician    Social History Main Topics  . Smoking status: Never Smoker   . Smokeless tobacco: Never Used  . Alcohol Use: No     Comment: Hx of abuse/clean since 2003   . Drug Use: No     Comment: Hx of abuse/clean for many years  . Sexual Activity: Not on file   Other Topics Concern  . 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 apixaban (ELIQUIS) 5 MG TABS tablet Take 1 tablet (5 mg total) by mouth 2 (two) times daily.  180 tablet  3  . benazepril (LOTENSIN) 20 MG tablet Take 0.5 tablets (10 mg total) by mouth daily. DUE for office visit please schedule > [phone]  90  tablet  0  . carvedilol (COREG) 25 MG tablet TAKE ONE TABLET BY MOUTH TWICE DAILY with food.  180 tablet  0  . digoxin (LANOXIN) 0.125 MG tablet TAKE ONE TABLET BY MOUTH ONE TIME DAILY. Due for an appointment.  90 tablet  0  . furosemide (LASIX) 40 MG tablet Take 20 mg by mouth daily.      . insulin aspart (NOVOLOG) 100 UNIT/ML FlexPen 3x a day (just before each meal) 11-10-38 units, and pen needles 3/day  10 pen  2  . levothyroxine (SYNTHROID, LEVOTHROID) 88 MCG tablet TAKE ONE TABLET BY MOUTH ONE TIME DAILY. Please schedule appointment for refills.  90 tablet  0  . NOVOFINE 32G X 6 MM MISC As directed      . ONE TOUCH ULTRA TEST test strip Follow directions provided by physician twice daily  50 each  11  . ONETOUCH DELICA LANCETS FINE MISC Follow directions provided by physician twice daily  100 each  11  . rosuvastatin (CRESTOR) 40 MG tablet Take one tablet daily  90 tablet  1  . [DISCONTINUED] glimepiride (AMARYL) 4 MG  tablet 1.5 tablet a day  30 tablet  11   No current facility-administered medications on file prior to visit.    No Known Allergies  Family History  Problem Relation Age of Onset  . Adopted: Yes    BP 122/76  Pulse 103  Temp(Src) 98.7 F (37.1 C) (Oral)  Ht 6\' 1"  (1.854 m)  Wt 194 lb (87.998 kg)  BMI 25.60 kg/m2  SpO2 97%    Review of Systems He denies hypoglycemia and weight change.     Objective:   Physical Exam Pulses: dorsalis pedis absent bilat.  Feet: no deformity. normal color and temp. no edema  Skin: no ulcer on the feet.  Neuro: sensation is intact to touch on the feet, but decreased from normal.     Lab Results  Component Value Date   HGBA1C 6.9* 08/13/2013   i have reviewed the following outside records: Office notes from dr Ron Parker  i reviewed electrocardiogram: paced    Assessment & Plan:  PAD, new. DM: well-controlled   Patient is advised the following: Patient Instructions  check your blood sugar 3 times a day.  vary the time of day when you check, between before the 3 meals, and at bedtime.  also check if you have symptoms of your blood sugar being too high or too low.  please keep a record of the readings and bring it to your next appointment here.  please call us sooner if your blood sugar goes below 70, or if it stays over 200.    Please come back for a follow-up appointment in 3 months.  A diabetes blood test is requested for you today.  We'll contact you with results.   Please let us know if you have leg or foot pain.  In that case, we would check an easy circulation test of the legs.

## 2013-08-13 NOTE — Patient Instructions (Addendum)
check your blood sugar 3 times a day.  vary the time of day when you check, between before the 3 meals, and at bedtime.  also check if you have symptoms of your blood sugar being too high or too low.  please keep a record of the readings and bring it to your next appointment here.  please call us sooner if your blood sugar goes below 70, or if it stays over 200.    Please come back for a follow-up appointment in 3 months.  A diabetes blood test is requested for you today.  We'll contact you with results.   Please let us know if you have leg or foot pain.  In that case, we would check an easy circulation test of the legs.

## 2013-09-20 ENCOUNTER — Other Ambulatory Visit: Payer: Self-pay

## 2013-09-20 MED ORDER — ONETOUCH DELICA LANCETS FINE MISC: Status: DC

## 2013-09-20 MED ORDER — GLUCOSE BLOOD VI STRP: ORAL_STRIP | Status: DC

## 2013-09-22 ENCOUNTER — Other Ambulatory Visit: Payer: Self-pay

## 2013-09-22 MED ORDER — GLUCOSE BLOOD VI STRP: ORAL_STRIP | Status: DC

## 2013-09-29 ENCOUNTER — Other Ambulatory Visit: Payer: Self-pay

## 2013-09-29 DIAGNOSIS — I10 Essential (primary) hypertension: Secondary | ICD-10-CM

## 2013-09-29 MED ORDER — DIGOXIN 125 MCG PO TABS: ORAL_TABLET | ORAL | Status: DC

## 2013-09-29 MED ORDER — LEVOTHYROXINE SODIUM 88 MCG PO TABS: ORAL_TABLET | ORAL | Status: DC

## 2013-09-29 MED ORDER — CARVEDILOL 25 MG PO TABS: ORAL_TABLET | ORAL | Status: DC

## 2013-09-29 MED ORDER — BENAZEPRIL HCL 20 MG PO TABS
ORAL_TABLET | ORAL | Status: DC
Start: 2013-09-29 — End: 2014-01-24

## 2013-09-29 MED ORDER — FUROSEMIDE 40 MG PO TABS: 40.0000 mg | ORAL_TABLET | Freq: Every day | ORAL | Status: DC

## 2013-10-06 ENCOUNTER — Encounter: Payer: Self-pay | Admitting: Internal Medicine

## 2013-10-06 ENCOUNTER — Ambulatory Visit (INDEPENDENT_AMBULATORY_CARE_PROVIDER_SITE_OTHER): Payer: BC Managed Care – PPO | Admitting: *Deleted

## 2013-10-06 DIAGNOSIS — I5022 Chronic systolic (congestive) heart failure: Secondary | ICD-10-CM

## 2013-10-06 DIAGNOSIS — I2589 Other forms of chronic ischemic heart disease: Secondary | ICD-10-CM

## 2013-10-06 DIAGNOSIS — I509 Heart failure, unspecified: Secondary | ICD-10-CM

## 2013-10-06 DIAGNOSIS — I255 Ischemic cardiomyopathy: Secondary | ICD-10-CM

## 2013-10-06 NOTE — Progress Notes (Signed)
Remote ICD transmission.   

## 2013-10-11 ENCOUNTER — Other Ambulatory Visit: Payer: Self-pay | Admitting: Endocrinology

## 2013-10-28 ENCOUNTER — Encounter: Payer: Self-pay | Admitting: Internal Medicine

## 2013-10-28 ENCOUNTER — Ambulatory Visit (INDEPENDENT_AMBULATORY_CARE_PROVIDER_SITE_OTHER): Payer: BC Managed Care – PPO | Admitting: Internal Medicine

## 2013-10-28 VITALS — BP 119/77 | HR 94 | Temp 98.0°F | Wt 193.4 lb

## 2013-10-28 DIAGNOSIS — N503 Cyst of epididymis: Secondary | ICD-10-CM

## 2013-10-28 NOTE — Patient Instructions (Signed)
We will schedule ultrasound  Please come back in December unless you get an appointment to see it closer provided at the [facility] or [place] office

## 2013-10-28 NOTE — Progress Notes (Signed)
Subjective:    Patient ID: Caleb Bell, male    DOB: [birth year], 58 y.o.   MRN: [id number]  DOS:  10/28/2013 Type of visit - description : acute Interval history: Patient's  Girlfriend noted "bumps" in the scrotum, left or right?. Patient not sure. He does not do  self testicular exam.    ROS Denies testicular pain, no penile discharge, dysuria or gross hematuria. Mild difficulty urinating  Past Medical History  Diagnosis Date  . Hodgkin's disease      Dx aprox 2004, s/p XRT-Chemo  . CAD (coronary artery disease)     CABG 2003  . Hyperlipidemia     low HDL  . DM type 2 (diabetes mellitus, type 2)   . Hypothyroidism   . Polysubstance     Alcohol, cocaine- resoloved for many years   . Atrial fibrillation     Paroxysmal, limited  . IVCD (intraventricular conduction defect)   . CRI (chronic renal insufficiency)      multifactorial... Dr. Posey Pronto.. consult September 18, 2009  . Hyperkalemia     August, 2011, Aldactone, Aldactone stopped  . Chronic systolic heart failure     EF 20-25% echo 5- 2011  . Ischemic cardiomyopathy     MRI12/12 no real viability in hypo/akinetic segment  CAth native and graft disease  . Elevated serum creatinine   . ICD (implantable cardiac defibrillator) in place     CRT-D placed March, 2013  . Ejection fraction < 50%     EF 25%, echo, November, 2012  //   planning followup 2-D echo to assess LV function with CRT D.  device in place  . Personal history of colonic adenomas 12/24/2011    Past Surgical History  Procedure Laterality Date  . Tonsillectomy    . Coronary artery bypass graft  2003  . Cystectomy      Upper Back  . Cardiac defibrillator placement    . Colonoscopy  12/24/2011    Procedure: COLONOSCOPY;  Surgeon: [name], MD;  Location: [facility];  Service: Endoscopy;  Laterality: N/A;    History   Social History  . Marital Status: Single    Spouse Name: N/A    Number of Children: 0  . Years of Education: N/A    Occupational History  . musician    Social History Main Topics  . Smoking status: Never Smoker   . Smokeless tobacco: Never Used  . Alcohol Use: No     Comment: Hx of abuse/clean since 2003   . Drug Use: No     Comment: Hx of abuse/clean for many years  . Sexual Activity: Not on file   Other Topics Concern  . 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  Always use your most recent med list.               apixaban 5 MG Tabs tablet  Commonly known as:  ELIQUIS  Take 1 tablet (5 mg total) by mouth 2 (two) times daily.     benazepril 20 MG tablet  Commonly known as:  LOTENSIN  Take 0.5 tablets (10 mg total) by mouth  daily.     carvedilol 25 MG tablet  Commonly known as:  COREG  TAKE ONE TABLET BY MOUTH TWICE DAILY with food.     digoxin 0.125 MG tablet  Commonly known as:  LANOXIN  TAKE ONE TABLET BY MOUTH ONE TIME DAILY.     furosemide 40 MG tablet  Commonly known as:  LASIX  Take 40 mg by mouth daily. Take 1/2 tablet daily     glucose blood test strip  Commonly known as:  ONE TOUCH ULTRA TEST  Follow directions provided by physician twice daily     levothyroxine 88 MCG tablet  Commonly known as:  SYNTHROID, LEVOTHROID  TAKE ONE TABLET BY MOUTH ONE TIME DAILY.     NOVOFINE 32G X 6 MM Misc  Generic drug:  Insulin Pen Needle  As directed     NOVOLOG FLEXPEN 100 UNIT/ML FlexPen  Generic drug:  insulin aspart  Inject AS DIRECTED THREE TIMES DAILY, JUST BEFORE EACH MEAL. 11-10-38 units.     ONETOUCH DELICA LANCETS FINE Misc  Follow directions provided by physician twice daily     rosuvastatin 40 MG tablet  Commonly known as:  CRESTOR  Take one tablet daily           Objective:   Physical Exam BP 119/77  Pulse 94  Temp(Src)  98 F (36.7 C) (Oral)  Wt 193 lb 6 oz (87.714 kg)  SpO2 97%  General -- alert, well-developed, NAD.  GU-- Penis normal to inspection and palpation, no discharge Scrotal contents: Testicles seems to be normal, epididymis are nontender, quite nodular, more noticeable on the right. No inguinal hernias Extremities-- no pretibial edema bilaterally  Neurologic--  alert & oriented X3. Speech normal, gait appropriate for age, strength symmetric and appropriate for age.  Psych-- Cognition and judgment appear intact. Cooperative with normal attention span and concentration. No anxious or depressed appearing.       Assessment & Plan:   Epididymal cyst? Most likely the patient has several epididymal cysts, without testicular mass. To be sure we will do ultrasound of the scrotum at a [place] office.  Patient lives in [place], this office is too far, I encouraged to see about transfer to another [place] doctor closer home.

## 2013-10-28 NOTE — Progress Notes (Signed)
Pre visit review using our clinic review tool, if applicable. No additional management support is needed unless otherwise documented below in the visit note. 

## 2013-11-01 LAB — MDC_IDC_ENUM_SESS_TYPE_REMOTE
Battery Remaining Longevity: 36 mo
Battery Remaining Percentage: 49 %
Battery Voltage: 2.92 V
Brady Statistic AP VP Percent: 87 %
Brady Statistic AP VS Percent: 1 %
Brady Statistic AS VP Percent: 9.3 %
Brady Statistic AS VS Percent: 1 %
Brady Statistic RA Percent Paced: 87 %
Date Time Interrogation Session: 20150916094626
HighPow Impedance: 64 Ohm
HighPow Impedance: 64 Ohm
Implantable Pulse Generator Model: 3257
Implantable Pulse Generator Serial Number: 1042049
Lead Channel Impedance Value: 400 Ohm
Lead Channel Impedance Value: 410 Ohm
Lead Channel Impedance Value: 480 Ohm
Lead Channel Pacing Threshold Amplitude: 0.75 V
Lead Channel Pacing Threshold Amplitude: 1.125 V
Lead Channel Pacing Threshold Amplitude: 1.25 V
Lead Channel Pacing Threshold Pulse Width: 0.5 ms
Lead Channel Pacing Threshold Pulse Width: 0.5 ms
Lead Channel Pacing Threshold Pulse Width: 0.8 ms
Lead Channel Sensing Intrinsic Amplitude: 12 mV
Lead Channel Sensing Intrinsic Amplitude: 3.8 mV
Lead Channel Setting Pacing Amplitude: 2 V
Lead Channel Setting Pacing Amplitude: 2.125
Lead Channel Setting Pacing Amplitude: 2.5 V
Lead Channel Setting Pacing Pulse Width: 0.5 ms
Lead Channel Setting Pacing Pulse Width: 0.8 ms
Lead Channel Setting Sensing Sensitivity: 0.5 mV
Zone Setting Detection Interval: 250 ms
Zone Setting Detection Interval: 300 ms

## 2013-11-03 ENCOUNTER — Encounter: Payer: Self-pay | Admitting: Cardiology

## 2013-11-08 ENCOUNTER — Other Ambulatory Visit: Payer: BC Managed Care – PPO

## 2013-11-09 ENCOUNTER — Ambulatory Visit
Admission: RE | Admit: 2013-11-09 | Discharge: 2013-11-09 | Disposition: A | Discharge status: Home / Self Care | Payer: BC Managed Care – PPO | Source: Ambulatory Visit | Attending: Internal Medicine | Admitting: Internal Medicine

## 2013-11-09 DIAGNOSIS — N503 Cyst of epididymis: Secondary | ICD-10-CM

## 2013-11-15 ENCOUNTER — Ambulatory Visit (INDEPENDENT_AMBULATORY_CARE_PROVIDER_SITE_OTHER): Payer: BC Managed Care – PPO | Admitting: Endocrinology

## 2013-11-15 ENCOUNTER — Encounter: Payer: Self-pay | Admitting: Endocrinology

## 2013-11-15 VITALS — BP 120/76 | HR 101 | Temp 97.9°F | Ht 73.0 in | Wt 192.0 lb

## 2013-11-15 DIAGNOSIS — N189 Chronic kidney disease, unspecified: Secondary | ICD-10-CM

## 2013-11-15 DIAGNOSIS — E1022 Type 1 diabetes mellitus with diabetic chronic kidney disease: Secondary | ICD-10-CM

## 2013-11-15 LAB — BASIC METABOLIC PANEL
BUN: 27 mg/dL — ABNORMAL HIGH (ref 6–23)
CO2: 23 mEq/L (ref 19–32)
Calcium: 9.3 mg/dL (ref 8.4–10.5)
Chloride: 102 mEq/L (ref 96–112)
Creatinine, Ser: 1.6 mg/dL — ABNORMAL HIGH (ref 0.4–1.5)
GFR: 49.36 mL/min — ABNORMAL LOW (ref >60.00)
Glucose, Bld: 158 mg/dL — ABNORMAL HIGH (ref 70–99)
Potassium: 4.3 mEq/L (ref 3.5–5.1)
Sodium: 136 mEq/L (ref 135–145)

## 2013-11-15 LAB — LIPID PANEL
Cholesterol: 105 mg/dL (ref 0–200)
HDL: 28.8 mg/dL — ABNORMAL LOW (ref >39.00)
LDL Cholesterol: 42 mg/dL (ref 0–99)
NonHDL: 76.2
Total CHOL/HDL Ratio: 4
Triglycerides: 169 mg/dL — ABNORMAL HIGH (ref 0.0–149.0)
VLDL: 33.8 mg/dL (ref 0.0–40.0)

## 2013-11-15 LAB — HEMOGLOBIN A1C: Hgb A1c MFr Bld: 6.8 % — ABNORMAL HIGH (ref 4.6–6.5)

## 2013-11-15 NOTE — Progress Notes (Signed)
Subjective:    Patient ID: Caleb Bell, male    DOB: 04/26/1955, 58 y.o.   MRN: [id number]  HPI Pt returns for f/u of diabetes mellitus: DM type: 1 Dx'ed: 7414 Complications: polyneuropathy, renal insuff, foot ulcer, retinopathy, and CAD Therapy: insulin since 2010 DKA: never Severe hypoglycemia: never Pancreatitis: never Other: he takes multiple daily injections Interval history: he brings a record of his cbg's which i have reviewed today, on his phone.  Almost all are in the 100's.  It is seldom over 200.  It is highest at hs. He seldom misses the insulin injections.  pt states he feels well in general. Past Medical History  Diagnosis Date  . Hodgkin's disease      Dx aprox 2004, s/p XRT-Chemo  . CAD (coronary artery disease)     CABG 2003  . Hyperlipidemia     low HDL  . DM type 2 (diabetes mellitus, type 2)   . Hypothyroidism   . Polysubstance     Alcohol, cocaine- resoloved for many years   . Atrial fibrillation     Paroxysmal, limited  . IVCD (intraventricular conduction defect)   . CRI (chronic renal insufficiency)      multifactorial... Dr. Posey Pronto.. consult September 18, 2009  . Hyperkalemia     August, 2011, Aldactone, Aldactone stopped  . Chronic systolic heart failure     EF 20-25% echo 5- 2011  . Ischemic cardiomyopathy     MRI12/12 no real viability in hypo/akinetic segment  CAth native and graft disease  . Elevated serum creatinine   . ICD (implantable cardiac defibrillator) in place     CRT-D placed March, 2013  . Ejection fraction < 50%     EF 25%, echo, November, 2012  //   planning followup 2-D echo to assess LV function with CRT D.  device in place  . Personal history of colonic adenomas 12/24/2011    Past Surgical History  Procedure Laterality Date  . Tonsillectomy    . Coronary artery bypass graft  2003  . Cystectomy      Upper Back  . Cardiac defibrillator placement    . Colonoscopy  12/24/2011    Procedure: COLONOSCOPY;  Surgeon: [name], MD;  Location: [facility];  Service: Endoscopy;  Laterality: N/A;    History   Social History  . Marital Status: Single    Spouse Name: N/A    Number of Children: 0  . Years of Education: N/A   Occupational History  . musician    Social History Main Topics  . Smoking status: Never Smoker   . Smokeless tobacco: Never Used  . Alcohol Use: No     Comment: Hx of abuse/clean since 2003   . Drug Use: No     Comment: Hx of abuse/clean for many years  . Sexual Activity: Not on file   Other Topics Concern  . 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 apixaban (ELIQUIS) 5 MG TABS tablet Take 1 tablet (5 mg total) by mouth 2 (two) times daily.  180 tablet  3  . benazepril (LOTENSIN) 20 MG tablet Take 0.5 tablets (10 mg total) by mouth  daily.  90 tablet  1  . carvedilol (COREG) 25 MG tablet TAKE ONE TABLET BY MOUTH TWICE DAILY with food.  180 tablet  1  . digoxin (LANOXIN) 0.125 MG tablet TAKE ONE TABLET BY MOUTH ONE TIME DAILY.  90 tablet  1  . furosemide (LASIX) 40 MG tablet Take 40 mg by mouth daily. Take 1/2 tablet daily      . glucose blood (ONE TOUCH ULTRA TEST) test strip Follow directions provided by physician twice daily  100 each  2  . levothyroxine (SYNTHROID, LEVOTHROID) 88 MCG tablet TAKE ONE TABLET BY MOUTH ONE TIME DAILY.  90 tablet  1  . NOVOFINE 32G X 6 MM MISC As directed      . NOVOLOG FLEXPEN 100 UNIT/ML FlexPen Inject AS DIRECTED THREE TIMES DAILY, JUST BEFORE EACH MEAL. 11-10-38 units.  15 mL  1  . ONETOUCH DELICA LANCETS FINE MISC Follow directions provided by physician twice daily  100 each  2  . rosuvastatin (CRESTOR) 40 MG tablet Take one tablet daily  90 tablet  1  . [DISCONTINUED] glimepiride (AMARYL) 4 MG tablet  1.5 tablet a day  30 tablet  11   No current facility-administered medications on file prior to visit.    No Known Allergies  Family History  Problem Relation Age of Onset  . Adopted: Yes    BP 120/76  Pulse 101  Temp(Src) 97.9 F (36.6 C) (Oral)  Ht 6\' 1"  (1.854 m)  Wt 192 lb (87.091 kg)  BMI 25.34 kg/m2  SpO2 97%    Review of Systems No hypoglycemia.  Denies LOC    Objective:   Physical Exam VITAL SIGNS:  See vs page GENERAL: no distress Pulses: dorsalis pedis intact bilat.   Feet: no deformity.  no edema Skin:  no ulcer on the feet.  normal color and temp. Neuro: sensation is intact to touch on the feet.   Lab Results  Component Value Date   HGBA1C 6.8* 11/15/2013   Lab Results  Component Value Date   CREATININE 1.6* 11/15/2013   BUN 27* 11/15/2013   NA 136 11/15/2013   K 4.3 11/15/2013   CL 102 11/15/2013   CO2 23 11/15/2013   Lab Results  Component Value Date   CHOL 105 11/15/2013   HDL 28.80* 11/15/2013   LDLCALC 42 11/15/2013   LDLDIRECT 66.3 11/11/2012   TRIG 169.0* 11/15/2013   CHOLHDL 4 11/15/2013       Assessment & Plan:  DM: well-controlled.  He declines pump rx Dyslipidemia: well-controlled Renal insuff: unchanged   Patient is advised the following: Patient Instructions  check your blood sugar 3 times a day.  vary the time of day when you check, between before the 3 meals, and at bedtime.  also check if you have symptoms of your blood sugar being too high or too low.  please keep a record of the readings and bring it to your next appointment here.  please call us sooner if your blood sugar goes below 70, or if it stays over 200.    Please come back for a follow-up appointment in 3 months.  A diabetes blood test is requested for you today.  We'll contact you with results.   Let me know if you decide to get an insulin pump.

## 2013-11-15 NOTE — Patient Instructions (Addendum)
check your blood sugar 3 times a day.  vary the time of day when you check, between before the 3 meals, and at bedtime.  also check if you have symptoms of your blood sugar being too high or too low.  please keep a record of the readings and bring it to your next appointment here.  please call us sooner if your blood sugar goes below 70, or if it stays over 200.    Please come back for a follow-up appointment in 3 months.  A diabetes blood test is requested for you today.  We'll contact you with results.   Let me know if you decide to get an insulin pump.

## 2013-11-26 ENCOUNTER — Other Ambulatory Visit: Payer: Self-pay | Admitting: Endocrinology

## 2013-11-26 MED ORDER — INSULIN ASPART 100 UNIT/ML FLEXPEN: PEN_INJECTOR | SUBCUTANEOUS | Status: DC

## 2013-11-26 NOTE — Telephone Encounter (Signed)
Patient need refill of Novalog 100 units

## 2013-12-08 ENCOUNTER — Ambulatory Visit: Payer: BC Managed Care – PPO | Admitting: Cardiology

## 2013-12-15 ENCOUNTER — Ambulatory Visit (INDEPENDENT_AMBULATORY_CARE_PROVIDER_SITE_OTHER): Payer: BC Managed Care – PPO | Admitting: Cardiology

## 2013-12-15 ENCOUNTER — Encounter: Payer: Self-pay | Admitting: Cardiology

## 2013-12-15 VITALS — BP 100/65 | HR 77 | Ht 73.0 in | Wt 196.0 lb

## 2013-12-15 DIAGNOSIS — I25810 Atherosclerosis of coronary artery bypass graft(s) without angina pectoris: Secondary | ICD-10-CM

## 2013-12-15 DIAGNOSIS — Z95810 Presence of automatic (implantable) cardiac defibrillator: Secondary | ICD-10-CM

## 2013-12-15 DIAGNOSIS — R0989 Other specified symptoms and signs involving the circulatory and respiratory systems: Secondary | ICD-10-CM

## 2013-12-15 DIAGNOSIS — R9430 Abnormal result of cardiovascular function study, unspecified: Secondary | ICD-10-CM

## 2013-12-15 DIAGNOSIS — Z951 Presence of aortocoronary bypass graft: Secondary | ICD-10-CM

## 2013-12-15 DIAGNOSIS — I5022 Chronic systolic (congestive) heart failure: Secondary | ICD-10-CM

## 2013-12-15 DIAGNOSIS — I255 Ischemic cardiomyopathy: Secondary | ICD-10-CM

## 2013-12-15 NOTE — Assessment & Plan Note (Signed)
He has not had a follow-up echo since his CRT D therapy in October, 2013. It is appropriate to proceed with 2-D echo at this time. This will be scheduled.

## 2013-12-15 NOTE — Assessment & Plan Note (Signed)
There is a soft right carotid bruit. I cannot find any record of a carotid Doppler over time. I suspect that he did have one around the time of his surgery in 2003. He will be scheduled for carotid Doppler.  As part of today's evaluation I spent greater than 25 minutes with his total care. More than half of this time has been with direct contact counseling the patient. We discussed the rationale for echo and Doppler studies. We talked about his medications.

## 2013-12-15 NOTE — Assessment & Plan Note (Signed)
His volume status is well controlled. No change in therapy.

## 2013-12-15 NOTE — Patient Instructions (Signed)
Your physician recommends that you continue on your current medications as directed. Please refer to the Current Medication list given to you today.   Your physician has requested that you have an echocardiogram. Echocardiography is a painless test that uses sound waves to create images of your heart. It provides your doctor with information about the size and shape of your heart and how well your heart's chambers and valves are working. This procedure takes approximately one hour. There are no restrictions for this procedure.    Your physician has requested that you have a carotid duplex. This test is an ultrasound of the carotid arteries in your neck. It looks at blood flow through these arteries that supply the brain with blood. Allow one hour for this exam. There are no restrictions or special instructions.   Your physician wants you to follow-up in: [place] will receive a reminder letter in the mail two months in advance. If you don't receive a letter, please call our office to schedule the follow-up appointment.

## 2013-12-15 NOTE — Assessment & Plan Note (Signed)
There is a history of atrial fibrillation. The patient is anticoagulated.

## 2013-12-15 NOTE — Assessment & Plan Note (Signed)
He is followed very carefully by electrophysiology.

## 2013-12-15 NOTE — Progress Notes (Signed)
Patient ID: Caleb Bell, male   DOB: 14-Jan-1956, 58 y.o.   MRN: [id number]    HPI Patient is seen today to follow-up coronary disease and ischemic cardiomyopathy. He is doing very well. He has significant left ventricular dysfunction. He received a CRT D device in 2013. He has done well. He has not had  an echo since that time. He is not having any significant chest pain or shortness of breath.  No Known Allergies  Current Outpatient Prescriptions  Medication Sig Dispense Refill  . apixaban (ELIQUIS) 5 MG TABS tablet Take 1 tablet (5 mg total) by mouth 2 (two) times daily. 180 tablet 3  . benazepril (LOTENSIN) 20 MG tablet Take 0.5 tablets (10 mg total) by mouth daily. 90 tablet 1  . carvedilol (COREG) 25 MG tablet TAKE ONE TABLET BY MOUTH TWICE DAILY with food. 180 tablet 1  . digoxin (LANOXIN) 0.125 MG tablet TAKE ONE TABLET BY MOUTH ONE TIME DAILY. 90 tablet 1  . furosemide (LASIX) 40 MG tablet Take 40 mg by mouth daily. Take 1/2 tablet daily    . glucose blood (ONE TOUCH ULTRA TEST) test strip Follow directions provided by physician twice daily 100 each 2  . insulin aspart (NOVOLOG FLEXPEN) 100 UNIT/ML FlexPen Inject AS DIRECTED THREE TIMES DAILY, JUST BEFORE EACH MEAL. 11-10-38 units. 15 mL 1  . levothyroxine (SYNTHROID, LEVOTHROID) 88 MCG tablet TAKE ONE TABLET BY MOUTH ONE TIME DAILY. 90 tablet 1  . NOVOFINE 32G X 6 MM MISC As directed    . ONETOUCH DELICA LANCETS FINE MISC Follow directions provided by physician twice daily 100 each 2  . rosuvastatin (CRESTOR) 40 MG tablet Take one tablet daily 90 tablet 1  . [DISCONTINUED] glimepiride (AMARYL) 4 MG tablet 1.5 tablet a day 30 tablet 11   No current facility-administered medications for this visit.    History   Social History  . Marital Status: Single    Spouse Name: N/A    Number of Children: 0  . Years of Education: N/A   Occupational History  . musician    Social History Main Topics  . Smoking status: Never Smoker    . Smokeless tobacco: Never Used  . Alcohol Use: No     Comment: Hx of abuse/clean since 2003   . Drug Use: No     Comment: Hx of abuse/clean for many years  . Sexual Activity: Not on file   Other Topics Concern  . 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 Adopted: Yes    Past Medical History  Diagnosis Date  . Hodgkin's disease      Dx aprox 2004, s/p XRT-Chemo  . CAD (coronary artery disease)     CABG 2003  . Hyperlipidemia     low HDL  . DM type 2 (diabetes mellitus, type 2)   . Hypothyroidism   . Polysubstance     Alcohol, cocaine- resoloved for many years   . Atrial fibrillation     Paroxysmal, limited  . IVCD (intraventricular conduction defect)   . CRI (chronic renal insufficiency)      multifactorial... Dr. Posey Pronto.. consult September 18, 2009  . Hyperkalemia     August, 2011, Aldactone, Aldactone  stopped  . Chronic systolic heart failure     EF 20-25% echo 5- 2011  . Ischemic cardiomyopathy     MRI12/12 no real viability in hypo/akinetic segment  CAth native and graft disease  . Elevated serum creatinine   . ICD (implantable cardiac defibrillator) in place     CRT-D placed March, 2013  . Ejection fraction < 50%     EF 25%, echo, November, 2012  //   planning followup 2-D echo to assess LV function with CRT D.  device in place  . Personal history of colonic adenomas 12/24/2011    Past Surgical History  Procedure Laterality Date  . Tonsillectomy    . Coronary artery bypass graft  2003  . Cystectomy      Upper Back  . Cardiac defibrillator placement    . Colonoscopy  12/24/2011    Procedure: COLONOSCOPY;  Surgeon: [name], MD;  Location: [facility];  Service: Endoscopy;  Laterality: N/A;    Patient Active Problem List   Diagnosis Date Noted  .  Cardiomyopathy, ischemic 03/31/2013  . IVCD (intraventricular conduction defect) 03/31/2013  . Chronic systolic CHF (congestive heart failure) 11/30/2012  . Personal history of colonic adenomas 12/24/2011  . Ejection fraction < 50%   . Automatic implantable cardioverter-defibrillator in situ   . CAD (coronary artery disease) 11/26/2010  . Annual physical exam 08/28/2010  . Hx of radiation therapy   . Hyperkalemia   . RENAL INSUFFICIENCY 05/25/2009  . INCREASED BLOOD PRESSURE 02/17/2008  . ERECTILE DYSFUNCTION 02/03/2008  . HYPERLIPIDEMIA 05/12/2007  . HYPOTHYROIDISM 02/15/2006  . Diabetes mellitus with chronic kidney disease 02/15/2006  . Ischemic cardiomyopathy-status post CABG x5 2003 02/15/2006  . HX, PERSONAL, HODGKIN'S DISEASE 02/15/2006  . Atrial fibrillation 02/15/2006    ROS:  Patient denies fever, chills, headache, sweats, rash, change in vision, change in hearing, chest pain, cough, nausea or vomiting, urinary symptoms. All other systems are reviewed and are negative.  PHYSICAL EXAM Patient is quite stable. He is oriented to person time and place. Affect is normal. Head is atraumatic. Sclera and conjunctiva are normal. There is no jugular venous distention. Lungs are clear. Respiratory effort is nonlabored. Cardiac exam reveals S1 and S2. Abdomen is soft. There is no peripheral edema. There are no musculoskeletal deformities. There are no skin rashes. There is a soft right carotid bruit. Neurologic exam is grossly intact.  Filed Vitals:   12/15/13 0815  BP: 100/65  Pulse: 77  Height: 6\' 1"  (1.854 m)  Weight: 196 lb (88.905 kg)  SpO2: 98%     ASSESSMENT & PLAN

## 2013-12-15 NOTE — Assessment & Plan Note (Signed)
The patient underwent bypass surgery in 2003. He received CRT-D in 2013. He is on appropriate medications. He did not tolerate Aldactone previously. No change in medications.

## 2013-12-15 NOTE — Assessment & Plan Note (Signed)
The patient underwent cath in 2012. He also had a cardiac MRI. After careful review was felt that there was no viable myocardium for which she would be appropriate to do an intervention. After that time he received CRT D therapy

## 2013-12-23 ENCOUNTER — Ambulatory Visit (HOSPITAL_COMMUNITY): Payer: BC Managed Care – PPO | Attending: Cardiology | Admitting: Radiology

## 2013-12-23 DIAGNOSIS — E785 Hyperlipidemia, unspecified: Secondary | ICD-10-CM | POA: Insufficient documentation

## 2013-12-23 DIAGNOSIS — I255 Ischemic cardiomyopathy: Secondary | ICD-10-CM | POA: Insufficient documentation

## 2013-12-23 NOTE — Progress Notes (Signed)
Echocardiogram performed.  

## 2013-12-27 ENCOUNTER — Ambulatory Visit (HOSPITAL_COMMUNITY): Payer: BC Managed Care – PPO | Attending: Cardiology | Admitting: Cardiology

## 2013-12-27 DIAGNOSIS — R0989 Other specified symptoms and signs involving the circulatory and respiratory systems: Secondary | ICD-10-CM

## 2013-12-27 DIAGNOSIS — I6523 Occlusion and stenosis of bilateral carotid arteries: Secondary | ICD-10-CM | POA: Diagnosis present

## 2013-12-27 NOTE — Progress Notes (Signed)
Carotid duplex performed 

## 2013-12-30 ENCOUNTER — Encounter (HOSPITAL_COMMUNITY): Payer: Self-pay | Admitting: Internal Medicine

## 2014-01-03 ENCOUNTER — Other Ambulatory Visit: Payer: Self-pay | Admitting: Endocrinology

## 2014-01-03 ENCOUNTER — Other Ambulatory Visit: Payer: Self-pay

## 2014-01-03 MED ORDER — ROSUVASTATIN CALCIUM 40 MG PO TABS: ORAL_TABLET | ORAL | Status: DC

## 2014-01-04 ENCOUNTER — Encounter: Payer: Self-pay | Admitting: Cardiology

## 2014-01-04 DIAGNOSIS — I779 Disorder of arteries and arterioles, unspecified: Secondary | ICD-10-CM | POA: Insufficient documentation

## 2014-01-04 DIAGNOSIS — I739 Peripheral vascular disease, unspecified: Secondary | ICD-10-CM

## 2014-01-10 ENCOUNTER — Ambulatory Visit (INDEPENDENT_AMBULATORY_CARE_PROVIDER_SITE_OTHER): Payer: BC Managed Care – PPO | Admitting: *Deleted

## 2014-01-10 ENCOUNTER — Encounter: Payer: Self-pay | Admitting: Internal Medicine

## 2014-01-10 ENCOUNTER — Other Ambulatory Visit: Payer: Self-pay

## 2014-01-10 DIAGNOSIS — Z95810 Presence of automatic (implantable) cardiac defibrillator: Secondary | ICD-10-CM

## 2014-01-10 DIAGNOSIS — I779 Disorder of arteries and arterioles, unspecified: Secondary | ICD-10-CM

## 2014-01-10 DIAGNOSIS — I739 Peripheral vascular disease, unspecified: Principal | ICD-10-CM

## 2014-01-10 DIAGNOSIS — I5022 Chronic systolic (congestive) heart failure: Secondary | ICD-10-CM

## 2014-01-10 DIAGNOSIS — I255 Ischemic cardiomyopathy: Secondary | ICD-10-CM

## 2014-01-10 LAB — MDC_IDC_ENUM_SESS_TYPE_REMOTE
Brady Statistic RA Percent Paced: 86 %
Brady Statistic RV Percent Paced: 96 %
HighPow Impedance: 64 Ohm
Implantable Pulse Generator Serial Number: 1042049
Lead Channel Impedance Value: 380 Ohm
Lead Channel Impedance Value: 410 Ohm
Lead Channel Impedance Value: 460 Ohm
Lead Channel Pacing Threshold Amplitude: 0.625 V
Lead Channel Pacing Threshold Amplitude: 1.125 V
Lead Channel Pacing Threshold Pulse Width: 0.5 ms
Lead Channel Pacing Threshold Pulse Width: 0.5 ms
Lead Channel Sensing Intrinsic Amplitude: 12 mV
Lead Channel Sensing Intrinsic Amplitude: 3.6 mV
Lead Channel Setting Pacing Amplitude: 2 V
Lead Channel Setting Pacing Amplitude: 2.125
Lead Channel Setting Pacing Amplitude: 2.5 V
Lead Channel Setting Pacing Pulse Width: 0.5 ms
Lead Channel Setting Pacing Pulse Width: 0.8 ms
Lead Channel Setting Sensing Sensitivity: 0.5 mV
Zone Setting Detection Interval: 250 ms
Zone Setting Detection Interval: 300 ms

## 2014-01-10 NOTE — Progress Notes (Signed)
Remote ICD transmission.   

## 2014-01-24 ENCOUNTER — Encounter: Payer: Self-pay | Admitting: Internal Medicine

## 2014-01-24 ENCOUNTER — Ambulatory Visit (INDEPENDENT_AMBULATORY_CARE_PROVIDER_SITE_OTHER): Payer: BC Managed Care – PPO | Admitting: Internal Medicine

## 2014-01-24 VITALS — BP 104/69 | HR 104 | Temp 97.5°F | Ht 73.0 in | Wt 197.2 lb

## 2014-01-24 DIAGNOSIS — Z0000 Encounter for general adult medical examination without abnormal findings: Secondary | ICD-10-CM

## 2014-01-24 DIAGNOSIS — I10 Essential (primary) hypertension: Secondary | ICD-10-CM

## 2014-01-24 LAB — URINALYSIS, ROUTINE W REFLEX MICROSCOPIC
Bilirubin Urine: NEGATIVE
Ketones, ur: NEGATIVE
Leukocytes, UA: NEGATIVE
Nitrite: NEGATIVE
Specific Gravity, Urine: 1.01 (ref 1.000–1.030)
Total Protein, Urine: NEGATIVE
Urine Glucose: NEGATIVE
Urobilinogen, UA: 0.2 (ref 0.0–1.0)
pH: 6 (ref 5.0–8.0)

## 2014-01-24 LAB — DIGOXIN LEVEL: Digoxin Level: 1 ng/mL (ref 0.8–2.0)

## 2014-01-24 LAB — TSH: TSH: 2.69 u[IU]/mL (ref 0.35–4.50)

## 2014-01-24 MED ORDER — TAMSULOSIN HCL 0.4 MG PO CAPS: 0.4000 mg | ORAL_CAPSULE | Freq: Every day | ORAL | Status: DC

## 2014-01-24 MED ORDER — DIGOXIN 125 MCG PO TABS: ORAL_TABLET | ORAL | Status: DC

## 2014-01-24 MED ORDER — CARVEDILOL 25 MG PO TABS: ORAL_TABLET | ORAL | Status: DC

## 2014-01-24 MED ORDER — APIXABAN 5 MG PO TABS: 5.0000 mg | ORAL_TABLET | Freq: Two times a day (BID) | ORAL | Status: DC

## 2014-01-24 MED ORDER — LEVOTHYROXINE SODIUM 88 MCG PO TABS: ORAL_TABLET | ORAL | Status: DC

## 2014-01-24 MED ORDER — BENAZEPRIL HCL 20 MG PO TABS: ORAL_TABLET | ORAL | Status: DC

## 2014-01-24 MED ORDER — ROSUVASTATIN CALCIUM 40 MG PO TABS: ORAL_TABLET | ORAL | Status: DC

## 2014-01-24 NOTE — Progress Notes (Signed)
Pre visit review using our clinic review tool, if applicable. No additional management support is needed unless otherwise documented below in the visit note. 

## 2014-01-24 NOTE — Progress Notes (Signed)
Subjective:    Patient ID: Caleb Bell, male    DOB: [birth year], 59 y.o.   MRN: [id number]  DOS:  01/24/2014 Type of visit - description : cpx Interval history: In general feeling well, has noted some difficulty urinating. On further questions, denies dysuria, gross hematuria, + nocturia several times without difficulty urinating  ROS No chest pain or difficulty breathing No nausea, vomiting, diarrhea or blood in the stools occ cough w/o  sputum production. No wheezing No anxiety or depression. Meds reviewed, good compliance. No ambulatory BPs  Past Medical History  Diagnosis Date  . Hodgkin's disease      Dx aprox 2004, s/p XRT-Chemo  . CAD (coronary artery disease)     CABG 2003  . Hyperlipidemia     low HDL  . DM type 2 (diabetes mellitus, type 2)     Dr Loanne Drilling  . Hypothyroidism   . Polysubstance     Alcohol, cocaine- resoloved for many years   . Atrial fibrillation     Paroxysmal, limited  . IVCD (intraventricular conduction defect)   . CRI (chronic renal insufficiency)      multifactorial... Dr. Posey Pronto.. consult September 18, 2009  . Hyperkalemia     August, 2011, Aldactone, Aldactone stopped  . Chronic systolic heart failure     EF 20-25% echo 5- 2011  . Ischemic cardiomyopathy     MRI12/12 no real viability in hypo/akinetic segment  CAth native and graft disease  . Elevated serum creatinine   . ICD (implantable cardiac defibrillator) in place     CRT-D placed March, 2013  . Ejection fraction < 50%     EF 25%, echo, November, 2012  //   planning followup 2-D echo to assess LV function with CRT D.  device in place  . Personal history of colonic adenomas 12/24/2011    Past Surgical History  Procedure Laterality Date  . Tonsillectomy    . Coronary artery bypass graft  2003  . Cystectomy      Upper Back  . Cardiac defibrillator placement    . Colonoscopy  12/24/2011    Procedure: COLONOSCOPY;  Surgeon: [name], MD;  Location: [facility];  Service:  Endoscopy;  Laterality: N/A;  . Bi-ventricular implantable cardioverter defibrillator N/A 04/01/2011    Procedure: BI-VENTRICULAR IMPLANTABLE CARDIOVERTER DEFIBRILLATOR  (CRT-D);  Surgeon: Deboraha Sprang, MD;  Location[facility] C[facility] LAB;  Service: Cardiovascular;  Laterality: N/A;    History   Social History  . Marital Status: Single    Spouse Name: N/A    Number of Children: 0  . Years of Education: N/A   Occupational History  . musician    Social History Main Topics  . Smoking status: Never Smoker   . Smokeless tobacco: Never Used  . Alcohol Use: No     Comment: Hx of abuse/clean since 2003   . Drug Use: No     Comment: Hx of abuse/clean for many years  . Sexual Activity: Not on file   Other Topics Concern  . 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  Always use your most recent med list.  apixaban 5 MG Tabs tablet  Commonly known as:  ELIQUIS  Take 1 tablet (5 mg total) by mouth 2 (two) times daily.     benazepril 20 MG tablet  Commonly known as:  LOTENSIN  Take 0.5 tablets (10 mg total) by mouth daily.     carvedilol 25 MG tablet  Commonly known as:  COREG  TAKE ONE TABLET BY MOUTH TWICE DAILY with food.     digoxin 0.125 MG tablet  Commonly known as:  LANOXIN  TAKE ONE TABLET BY MOUTH ONE TIME DAILY.     furosemide 40 MG tablet  Commonly known as:  LASIX  Take 40 mg by mouth daily. Take 1/2 tablet daily     glucose blood test strip  Commonly known as:  ONE TOUCH ULTRA TEST  Follow directions provided by physician twice daily     insulin aspart 100 UNIT/ML FlexPen  Commonly known as:  NOVOLOG FLEXPEN  Inject AS DIRECTED THREE TIMES DAILY, JUST BEFORE EACH MEAL. 11-10-38 units.     levothyroxine 88 MCG tablet  Commonly  known as:  SYNTHROID, LEVOTHROID  TAKE ONE TABLET BY MOUTH ONE TIME DAILY.     NOVOFINE 32G X 6 MM Misc  Generic drug:  Insulin Pen Needle  Test three times daily as directed.     ONETOUCH DELICA LANCETS FINE Misc  Follow directions provided by physician twice daily     rosuvastatin 40 MG tablet  Commonly known as:  CRESTOR  Take one tablet daily     tamsulosin 0.4 MG Caps capsule  Commonly known as:  FLOMAX  Take 1 capsule (0.4 mg total) by mouth daily.           Objective:   Physical Exam BP 104/69 mmHg  Pulse 104  Temp(Src) 97.5 F (36.4 C) (Oral)  Ht 6\' 1"  (1.854 m)  Wt 197 lb 4 oz (89.472 kg)  BMI 26.03 kg/m2  SpO2 99% Pulse was rechecked, in the 70s, regular. General -- alert, well-developed, NAD.  Neck --no thyromegaly  HEENT-- Not pale.   Lungs -- normal respiratory effort, no intercostal retractions, no accessory muscle use, and normal breath sounds.  Heart-- normal rate, regular rhythm, no murmur.  Abdomen-- Not distended, good bowel sounds,soft, non-tender. Rectal-- No external abnormalities noted. Normal sphincter tone. No rectal masses or tenderness. Stool brown. Prostate--Prostate gland firm and smooth, no enlargement, nodularity, tenderness, mass, asymmetry or induration. Extremities-- no pretibial edema bilaterally  Neurologic--  alert & oriented X3. Speech normal, gait appropriate for age, strength symmetric and appropriate for age.  Psych-- Cognition and judgment appear intact. Cooperative with normal attention span and concentration. No anxious or depressed appearing.        Assessment & Plan:

## 2014-01-24 NOTE — Patient Instructions (Signed)
Get your blood work before you leave   Start flomax  Please come back to the office in 6 months  for a routine check up

## 2014-01-24 NOTE — Assessment & Plan Note (Addendum)
Td 03 08-2010 Pneumonia and flu shots, strongly declined  zostavax -- 2014 Labs reviewed  will check a PSA and a digoxin level. Diet exercise discussed Cscope 12-2011, next 3 years  Nocturia, normal DRE , last PSA was 10-2012 and above baseline   Plan: Flomax, PSA, UA UCX. If PSA velocity increased, consider urology referral  Other issues seem stable. Follow-up 6 months.

## 2014-01-25 LAB — URINE CULTURE: Colony Count: 3000

## 2014-01-26 NOTE — Addendum Note (Signed)
Addended by: Kathlene November E on: 01/26/2014 08:55 PM   Modules accepted: Orders, SmartSet

## 2014-01-27 ENCOUNTER — Encounter: Payer: Self-pay | Admitting: Cardiology

## 2014-01-27 NOTE — Addendum Note (Signed)
Addended by: Wilfrid Lund on: 01/27/2014 02:55 PM   Modules accepted: Orders

## 2014-02-12 ENCOUNTER — Telehealth: Payer: Self-pay | Admitting: Internal Medicine

## 2014-02-12 DIAGNOSIS — Z0000 Encounter for general adult medical examination without abnormal findings: Secondary | ICD-10-CM

## 2014-02-12 NOTE — Telephone Encounter (Signed)
Needs a PSA done at [facility], please arrange

## 2014-02-14 NOTE — Telephone Encounter (Signed)
PSA ordered to [facility] lab as requested.

## 2014-02-15 ENCOUNTER — Ambulatory Visit (INDEPENDENT_AMBULATORY_CARE_PROVIDER_SITE_OTHER): Payer: BC Managed Care – PPO | Admitting: Endocrinology

## 2014-02-15 ENCOUNTER — Encounter: Payer: Self-pay | Admitting: Endocrinology

## 2014-02-15 VITALS — BP 126/86 | HR 71 | Temp 97.9°F | Ht 73.0 in | Wt 195.0 lb

## 2014-02-15 DIAGNOSIS — N259 Disorder resulting from impaired renal tubular function, unspecified: Secondary | ICD-10-CM

## 2014-02-15 DIAGNOSIS — E1022 Type 1 diabetes mellitus with diabetic chronic kidney disease: Secondary | ICD-10-CM

## 2014-02-15 DIAGNOSIS — N400 Enlarged prostate without lower urinary tract symptoms: Secondary | ICD-10-CM

## 2014-02-15 DIAGNOSIS — N189 Chronic kidney disease, unspecified: Secondary | ICD-10-CM

## 2014-02-15 LAB — HEMOGLOBIN A1C: Hgb A1c MFr Bld: 7.3 % — ABNORMAL HIGH (ref 4.6–6.5)

## 2014-02-15 LAB — PSA: PSA: 1.03 ng/mL (ref 0.10–4.00)

## 2014-02-15 NOTE — Patient Instructions (Addendum)
check your blood sugar 3 times a day.  vary the time of day when you check, between before the 3 meals, and at bedtime.  also check if you have symptoms of your blood sugar being too high or too low.  please keep a record of the readings and bring it to your next appointment here.  please call us sooner if your blood sugar goes below 70, or if it stays over 200.   Please come back for a follow-up appointment in 4 months.   A diabetes blood test is requested for you today.  We'll contact you with results.

## 2014-02-15 NOTE — Progress Notes (Signed)
Subjective:    Patient ID: Caleb Bell, male    DOB: 07-10-1955, 59 y.o.   MRN: [id number]  HPI Pt returns for f/u of diabetes mellitus: DM type: 1 Dx'ed: 8768 Complications: polyneuropathy, renal insuff, foot ulcer, retinopathy, and CAD.   Therapy: insulin since 2010. DKA: never Severe hypoglycemia: never.   Pancreatitis: never Other: he takes multiple daily injections; he declines pump therapy. Interval history: no cbg record, but states cbg's are well-controlled.  There is no trend throughout the day.   Past Medical History  Diagnosis Date  . Hodgkin's disease      Dx aprox 2004, s/p XRT-Chemo  . CAD (coronary artery disease)     CABG 2003  . Hyperlipidemia     low HDL  . DM type 2 (diabetes mellitus, type 2)     Dr Loanne Drilling  . Hypothyroidism   . Polysubstance     Alcohol, cocaine- resoloved for many years   . Atrial fibrillation     Paroxysmal, limited  . IVCD (intraventricular conduction defect)   . CRI (chronic renal insufficiency)      multifactorial... Dr. Posey Pronto.. consult September 18, 2009  . Hyperkalemia     August, 2011, Aldactone, Aldactone stopped  . Chronic systolic heart failure     EF 20-25% echo 5- 2011  . Ischemic cardiomyopathy     MRI12/12 no real viability in hypo/akinetic segment  CAth native and graft disease  . Elevated serum creatinine   . ICD (implantable cardiac defibrillator) in place     CRT-D placed March, 2013  . Ejection fraction < 50%     EF 25%, echo, November, 2012  //   planning followup 2-D echo to assess LV function with CRT D.  device in place  . Personal history of colonic adenomas 12/24/2011    Past Surgical History  Procedure Laterality Date  . Tonsillectomy    . Coronary artery bypass graft  2003  . Cystectomy      Upper Back  . Cardiac defibrillator placement    . Colonoscopy  12/24/2011    Procedure: COLONOSCOPY;  Surgeon: [name], MD;  Location: [facility];  Service: Endoscopy;  Laterality: N/A;  .  Bi-ventricular implantable cardioverter defibrillator N/A 04/01/2011    Procedure: BI-VENTRICULAR IMPLANTABLE CARDIOVERTER DEFIBRILLATOR  (CRT-D);  Surgeon: Deboraha Sprang, MD;  Location: [facility] CATH LAB;  Service: Cardiovascular;  Laterality: N/A;    History   Social History  . Marital Status: Single    Spouse Name: N/A    Number of Children: 0  . Years of Education: N/A   Occupational History  . musician    Social History Main Topics  . Smoking status: Never Smoker   . Smokeless tobacco: Never Used  . Alcohol Use: No     Comment: Hx of abuse/clean since 2003   . Drug Use: No     Comment: Hx of abuse/clean for many years  . Sexual Activity: Not on file   Other Topics Concern  . 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 apixaban (ELIQUIS) 5 MG TABS tablet Take 1 tablet (5 mg total) by mouth 2 (two) times daily. 180 tablet  2  . benazepril (LOTENSIN) 20 MG tablet Take 0.5 tablets (10 mg total) by mouth daily. 90 tablet 2  . carvedilol (COREG) 25 MG tablet TAKE ONE TABLET BY MOUTH TWICE DAILY with food. 180 tablet 2  . digoxin (LANOXIN) 0.125 MG tablet TAKE ONE TABLET BY MOUTH ONE TIME DAILY. 90 tablet 1  . furosemide (LASIX) 40 MG tablet Take 40 mg by mouth daily. Take 1/2 tablet daily    . glucose blood (ONE TOUCH ULTRA TEST) test strip Follow directions provided by physician twice daily 100 each 2  . insulin aspart (NOVOLOG FLEXPEN) 100 UNIT/ML FlexPen Inject AS DIRECTED THREE TIMES DAILY, JUST BEFORE EACH MEAL. 11-10-38 units. (Patient taking differently: 3 times a day (just before each meal) 15-20-40 units.) 15 mL 1  . levothyroxine (SYNTHROID, LEVOTHROID) 88 MCG tablet TAKE ONE TABLET BY MOUTH ONE TIME DAILY. 90 tablet 2  . NOVOFINE 32G X 6  MM MISC Test three times daily as directed. 100 each 10  . ONETOUCH DELICA LANCETS FINE MISC Follow directions provided by physician twice daily 100 each 2  . rosuvastatin (CRESTOR) 40 MG tablet Take one tablet daily 90 tablet 2  . tamsulosin (FLOMAX) 0.4 MG CAPS capsule Take 1 capsule (0.4 mg total) by mouth daily. 90 capsule 2  . [DISCONTINUED] glimepiride (AMARYL) 4 MG tablet 1.5 tablet a day 30 tablet 11   No current facility-administered medications on file prior to visit.    No Known Allergies  Family History  Problem Relation Age of Onset  . Adopted: Yes    BP 126/86 mmHg  Pulse 71  Temp(Src) 97.9 F (36.6 C) (Oral)  Ht 6\' 1"  (1.854 m)  Wt 195 lb (88.451 kg)  BMI 25.73 kg/m2  SpO2 99%  Review of Systems He denies hypoglycemia and weight change.    Objective:   Physical Exam VITAL SIGNS:  See vs page GENERAL: no distress Pulses: dorsalis pedis intact bilat.   MSK: no deformity of the feet CV: no leg edema Skin:  no ulcer on the feet.  normal color and temp on the feet.   Neuro: sensation is intact to touch on the feet.    Lab Results  Component Value Date   HGBA1C 7.3* 02/15/2014      Assessment & Plan:  DM: mild exacerbation.   Noncompliance with cbg recording: I'll work around this as best I can.   Patient is advised the following: Patient Instructions  check your blood sugar 3 times a day.  vary the time of day when you check, between before the 3 meals, and at bedtime.  also check if you have symptoms of your blood sugar being too high or too low.  please keep a record of the readings and bring it to your next appointment here.  please call us sooner if your blood sugar goes below 70, or if it stays over 200.   Please come back for a follow-up appointment in 4 months.   A diabetes blood test is requested for you today.  We'll contact you with results.    increase the novolog to 3 times a day (just before each meal) 15-20-40 units.

## 2014-03-08 NOTE — Telephone Encounter (Signed)
error 

## 2014-04-21 ENCOUNTER — Encounter: Payer: Self-pay | Admitting: *Deleted

## 2014-05-11 LAB — HM DIABETES EYE EXAM

## 2014-05-18 ENCOUNTER — Encounter: Payer: Self-pay | Admitting: *Deleted

## 2014-06-01 ENCOUNTER — Other Ambulatory Visit: Payer: Self-pay | Admitting: Endocrinology

## 2014-06-03 ENCOUNTER — Other Ambulatory Visit: Payer: Self-pay

## 2014-06-03 MED ORDER — INSULIN ASPART 100 UNIT/ML FLEXPEN: PEN_INJECTOR | SUBCUTANEOUS | Status: DC

## 2014-06-13 ENCOUNTER — Ambulatory Visit (INDEPENDENT_AMBULATORY_CARE_PROVIDER_SITE_OTHER): Payer: BLUE CROSS/BLUE SHIELD | Admitting: *Deleted

## 2014-06-13 DIAGNOSIS — I255 Ischemic cardiomyopathy: Secondary | ICD-10-CM | POA: Diagnosis not present

## 2014-06-13 LAB — CUP PACEART INCLINIC DEVICE CHECK
Battery Remaining Longevity: 26.4 mo
Brady Statistic RA Percent Paced: 87 %
Brady Statistic RV Percent Paced: 97 %
Date Time Interrogation Session: 20160523152643
HighPow Impedance: 65.25 Ohm
Lead Channel Impedance Value: 412.5 Ohm
Lead Channel Impedance Value: 412.5 Ohm
Lead Channel Impedance Value: 462.5 Ohm
Lead Channel Pacing Threshold Amplitude: 0.625 V
Lead Channel Pacing Threshold Amplitude: 1.125 V
Lead Channel Pacing Threshold Amplitude: 1.25 V
Lead Channel Pacing Threshold Pulse Width: 0.5 ms
Lead Channel Pacing Threshold Pulse Width: 0.5 ms
Lead Channel Pacing Threshold Pulse Width: 0.8 ms
Lead Channel Sensing Intrinsic Amplitude: 12 mV
Lead Channel Sensing Intrinsic Amplitude: 3.6 mV
Lead Channel Setting Pacing Amplitude: 2 V
Lead Channel Setting Pacing Amplitude: 2.125
Lead Channel Setting Pacing Amplitude: 2.5 V
Lead Channel Setting Pacing Pulse Width: 0.5 ms
Lead Channel Setting Pacing Pulse Width: 0.8 ms
Lead Channel Setting Sensing Sensitivity: 0.5 mV
Pulse Gen Serial Number: 1042049
Zone Setting Detection Interval: 250 ms
Zone Setting Detection Interval: 300 ms

## 2014-06-13 NOTE — Progress Notes (Signed)
CRT-D device check in office. Thresholds and sensing consistent with previous device measurements. Lead impedance trends stable over time. 14 mode switch episodes recorded--longest was 5 hours 17 minutes. + Eliquis. No ventricular arrhythmia episodes recorded. Patient bi-ventricularly pacing 97% of the time. Device programmed with appropriate safety margins. CorVue increase 3-6 x 8 days and 1-29 x 13 days but stable at this time. Audible/vibratory alerts demonstrated for patient and pt aware to contact office if felt. No changes made this session. Estimated longevity 2.2 to 2.5 years.  Patient enrolled in remote follow up. ROV 09-22-14 @ 930 with SK.

## 2014-06-16 ENCOUNTER — Encounter: Payer: Self-pay | Admitting: Endocrinology

## 2014-06-16 ENCOUNTER — Ambulatory Visit (INDEPENDENT_AMBULATORY_CARE_PROVIDER_SITE_OTHER): Payer: BLUE CROSS/BLUE SHIELD | Admitting: Endocrinology

## 2014-06-16 VITALS — BP 112/70 | HR 87 | Temp 97.6°F | Ht 73.0 in | Wt 192.0 lb

## 2014-06-16 DIAGNOSIS — E1022 Type 1 diabetes mellitus with diabetic chronic kidney disease: Secondary | ICD-10-CM

## 2014-06-16 DIAGNOSIS — N189 Chronic kidney disease, unspecified: Secondary | ICD-10-CM

## 2014-06-16 LAB — HEMOGLOBIN A1C: Hgb A1c MFr Bld: 6.8 % — ABNORMAL HIGH (ref 4.6–6.5)

## 2014-06-16 MED ORDER — INSULIN ASPART 100 UNIT/ML FLEXPEN: PEN_INJECTOR | SUBCUTANEOUS | Status: DC

## 2014-06-16 NOTE — Patient Instructions (Addendum)
check your blood sugar 3 times a day.  vary the time of day when you check, between before the 3 meals, and at bedtime.  also check if you have symptoms of your blood sugar being too high or too low.  please keep a record of the readings and bring it to your next appointment here.  please call us sooner if your blood sugar goes below 70, or if it stays over 200.   Please come back for a follow-up appointment in 4 months.   A diabetes blood test is requested for you today.  We'll contact you with results.   Please change the insulin to 3 times a day (just before each meal) 15-25-35 units.

## 2014-06-16 NOTE — Progress Notes (Signed)
Subjective:    Patient ID: Caleb Bell, male    DOB: Jun 01, 1955, 59 y.o.   MRN: [id number]  HPI Pt returns for f/u of diabetes mellitus: DM type: 1 Dx'ed: 1937 Complications: polyneuropathy, renal insuff, foot ulcer, retinopathy, and CAD.   Therapy: insulin since 2010. DKA: never Severe hypoglycemia: never.   Pancreatitis: never Other: he takes multiple daily injections; he declines pump therapy. Interval history: he brings his cbg meter, which i have reviewed today.  It varies from 98-200's.  It is in general higher as the day goes on, but not necessarily so. Past Medical History  Diagnosis Date  . Hodgkin's disease      Dx aprox 2004, s/p XRT-Chemo  . CAD (coronary artery disease)     CABG 2003  . Hyperlipidemia     low HDL  . DM type 2 (diabetes mellitus, type 2)     Dr Loanne Drilling  . Hypothyroidism   . Polysubstance     Alcohol, cocaine- resoloved for many years   . Atrial fibrillation     Paroxysmal, limited  . IVCD (intraventricular conduction defect)   . CRI (chronic renal insufficiency)      multifactorial... Dr. Posey Pronto.. consult September 18, 2009  . Hyperkalemia     August, 2011, Aldactone, Aldactone stopped  . Chronic systolic heart failure     EF 20-25% echo 5- 2011  . Ischemic cardiomyopathy     MRI12/12 no real viability in hypo/akinetic segment  CAth native and graft disease  . Elevated serum creatinine   . ICD (implantable cardiac defibrillator) in place     CRT-D placed March, 2013  . Ejection fraction < 50%     EF 25%, echo, November, 2012  //   planning followup 2-D echo to assess LV function with CRT D.  device in place  . Personal history of colonic adenomas 12/24/2011    Past Surgical History  Procedure Laterality Date  . Tonsillectomy    . Coronary artery bypass graft  2003  . Cystectomy      Upper Back  . Cardiac defibrillator placement    . Colonoscopy  12/24/2011    Procedure: COLONOSCOPY;  Surgeon: [name], MD;  Location: [facility]  ENDOSCOPY;  Service: Endoscopy;  Laterality: N/A;  . Bi-ventricular implantable cardioverter defibrillator N/A 04/01/2011    Procedure: BI-VENTRICULAR IMPLANTABLE CARDIOVERTER DEFIBRILLATOR  (CRT-D);  Surgeon: Deboraha Sprang, MD;  Location: [facility] CATH LAB;  Service: Cardiovascular;  Laterality: N/A;    History   Social History  . Marital Status: Single    Spouse Name: N/A  . Number of Children: 0  . Years of Education: N/A   Occupational History  . musician    Social History Main Topics  . Smoking status: Never Smoker   . Smokeless tobacco: Never Used  . Alcohol Use: No     Comment: Hx of abuse/clean since 2003   . Drug Use: No     Comment: Hx of abuse/clean for many years  . Sexual Activity: Not on file   Other Topics Concern  . 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 apixaban (ELIQUIS) 5 MG TABS tablet Take 1 tablet (  5 mg total) by mouth 2 (two) times daily. 180 tablet 2  . benazepril (LOTENSIN) 20 MG tablet Take 0.5 tablets (10 mg total) by mouth daily. 90 tablet 2  . carvedilol (COREG) 25 MG tablet TAKE ONE TABLET BY MOUTH TWICE DAILY with food. 180 tablet 2  . digoxin (LANOXIN) 0.125 MG tablet TAKE ONE TABLET BY MOUTH ONE TIME DAILY. 90 tablet 1  . furosemide (LASIX) 40 MG tablet Take 40 mg by mouth daily. Take 1/2 tablet daily    . glucose blood (ONE TOUCH ULTRA TEST) test strip Follow directions provided by physician twice daily 100 each 2  . levothyroxine (SYNTHROID, LEVOTHROID) 88 MCG tablet TAKE ONE TABLET BY MOUTH ONE TIME DAILY. 90 tablet 2  . NOVOFINE 32G X 6 MM MISC Test three times daily as directed. 100 each 10  . ONETOUCH DELICA LANCETS FINE MISC Follow directions provided by physician twice daily 100 each 2  . rosuvastatin  (CRESTOR) 40 MG tablet Take one tablet daily 90 tablet 2  . [DISCONTINUED] glimepiride (AMARYL) 4 MG tablet 1.5 tablet a day 30 tablet 11   No current facility-administered medications on file prior to visit.    No Known Allergies  Family History  Problem Relation Age of Onset  . Adopted: Yes    BP 112/70 mmHg  Pulse 87  Temp(Src) 97.6 F (36.4 C) (Oral)  Ht 6\' 1"  (1.854 m)  Wt 192 lb (87.091 kg)  BMI 25.34 kg/m2  SpO2 98%  Review of Systems He seldom has hypoglycemia, and these episodes are mild.  It happens at hs.     Objective:   Physical Exam VITAL SIGNS:  See vs page. GENERAL: no distress. Pulses: dorsalis pedis intact bilat.   MSK: no deformity of the feet CV: no leg edema Skin:  no ulcer on the feet.  normal color and temp on the feet. Neuro: sensation is intact to touch on the feet   Lab Results  Component Value Date   HGBA1C 6.8* 06/16/2014       Assessment & Plan:  DM: well-controlled, except the pattern of his cbg's indicates he needs some adjustment in his therapy  Patient is advised the following: Patient Instructions  check your blood sugar 3 times a day.  vary the time of day when you check, between before the 3 meals, and at bedtime.  also check if you have symptoms of your blood sugar being too high or too low.  please keep a record of the readings and bring it to your next appointment here.  please call us sooner if your blood sugar goes below 70, or if it stays over 200.   Please come back for a follow-up appointment in 4 months.   A diabetes blood test is requested for you today.  We'll contact you with results.   Please change the insulin to 3 times a day (just before each meal) 15-25-35 units.

## 2014-07-18 ENCOUNTER — Encounter: Payer: Self-pay | Admitting: Internal Medicine

## 2014-07-20 ENCOUNTER — Other Ambulatory Visit: Payer: Self-pay | Admitting: Endocrinology

## 2014-07-27 ENCOUNTER — Other Ambulatory Visit: Payer: Self-pay | Admitting: Endocrinology

## 2014-09-22 ENCOUNTER — Encounter: Payer: Self-pay | Admitting: Internal Medicine

## 2014-09-22 ENCOUNTER — Ambulatory Visit (INDEPENDENT_AMBULATORY_CARE_PROVIDER_SITE_OTHER): Payer: BLUE CROSS/BLUE SHIELD | Admitting: Internal Medicine

## 2014-09-22 VITALS — BP 100/60 | HR 66 | Ht 73.0 in | Wt 192.8 lb

## 2014-09-22 DIAGNOSIS — I454 Nonspecific intraventricular block: Secondary | ICD-10-CM | POA: Diagnosis not present

## 2014-09-22 DIAGNOSIS — Z79899 Other long term (current) drug therapy: Secondary | ICD-10-CM | POA: Diagnosis not present

## 2014-09-22 DIAGNOSIS — Z4502 Encounter for adjustment and management of automatic implantable cardiac defibrillator: Secondary | ICD-10-CM

## 2014-09-22 DIAGNOSIS — Z95810 Presence of automatic (implantable) cardiac defibrillator: Secondary | ICD-10-CM | POA: Diagnosis not present

## 2014-09-22 DIAGNOSIS — I480 Paroxysmal atrial fibrillation: Secondary | ICD-10-CM | POA: Diagnosis not present

## 2014-09-22 DIAGNOSIS — I5022 Chronic systolic (congestive) heart failure: Secondary | ICD-10-CM

## 2014-09-22 DIAGNOSIS — I255 Ischemic cardiomyopathy: Secondary | ICD-10-CM

## 2014-09-22 LAB — CBC WITH DIFFERENTIAL/PLATELET
Basophils Absolute: 0 10*3/uL (ref 0.0–0.1)
Basophils Relative: 0.4 % (ref 0.0–3.0)
Eosinophils Absolute: 0.3 10*3/uL (ref 0.0–0.7)
Eosinophils Relative: 4 % (ref 0.0–5.0)
HCT: 37.3 % — ABNORMAL LOW (ref 39.0–52.0)
Hemoglobin: 12.5 g/dL — ABNORMAL LOW (ref 13.0–17.0)
Lymphocytes Relative: 22.5 % (ref 12.0–46.0)
Lymphs Abs: 1.8 10*3/uL (ref 0.7–4.0)
MCHC: 33.5 g/dL (ref 30.0–36.0)
MCV: 82.1 fl (ref 78.0–100.0)
Monocytes Absolute: 0.5 10*3/uL (ref 0.1–1.0)
Monocytes Relative: 6.1 % (ref 3.0–12.0)
Neutro Abs: 5.2 10*3/uL (ref 1.4–7.7)
Neutrophils Relative %: 67 % (ref 43.0–77.0)
Platelets: 197 10*3/uL (ref 150.0–400.0)
RBC: 4.55 Mil/uL (ref 4.22–5.81)
RDW: 13.7 % (ref 11.5–15.5)
WBC: 7.8 10*3/uL (ref 4.0–10.5)

## 2014-09-22 LAB — COMPREHENSIVE METABOLIC PANEL
ALT: 16 U/L (ref 0–53)
AST: 19 U/L (ref 0–37)
Albumin: 4.4 g/dL (ref 3.5–5.2)
Alkaline Phosphatase: 78 U/L (ref 39–117)
BUN: 33 mg/dL — ABNORMAL HIGH (ref 6–23)
CO2: 27 mEq/L (ref 19–32)
Calcium: 9.5 mg/dL (ref 8.4–10.5)
Chloride: 99 mEq/L (ref 96–112)
Creatinine, Ser: 1.68 mg/dL — ABNORMAL HIGH (ref 0.40–1.50)
GFR: 44.84 mL/min — ABNORMAL LOW (ref >60.00)
Glucose, Bld: 137 mg/dL — ABNORMAL HIGH (ref 70–99)
Potassium: 4.9 mEq/L (ref 3.5–5.1)
Sodium: 134 mEq/L — ABNORMAL LOW (ref 135–145)
Total Bilirubin: 0.6 mg/dL (ref 0.2–1.2)
Total Protein: 7.7 g/dL (ref 6.0–8.3)

## 2014-09-22 LAB — CUP PACEART INCLINIC DEVICE CHECK
Battery Remaining Longevity: 24 mo
Brady Statistic RA Percent Paced: 90 %
Brady Statistic RV Percent Paced: 97 %
Date Time Interrogation Session: 20160901120312
HighPow Impedance: 68.625
Lead Channel Impedance Value: 425 Ohm
Lead Channel Impedance Value: 437.5 Ohm
Lead Channel Impedance Value: 450 Ohm
Lead Channel Pacing Threshold Amplitude: 0.625 V
Lead Channel Pacing Threshold Amplitude: 1 V
Lead Channel Pacing Threshold Amplitude: 1.25 V
Lead Channel Pacing Threshold Pulse Width: 0.5 ms
Lead Channel Pacing Threshold Pulse Width: 0.5 ms
Lead Channel Pacing Threshold Pulse Width: 0.8 ms
Lead Channel Sensing Intrinsic Amplitude: 12 mV
Lead Channel Sensing Intrinsic Amplitude: 4.2 mV
Lead Channel Setting Pacing Amplitude: 2 V
Lead Channel Setting Pacing Amplitude: 2 V
Lead Channel Setting Pacing Amplitude: 2.5 V
Lead Channel Setting Pacing Pulse Width: 0.5 ms
Lead Channel Setting Pacing Pulse Width: 0.8 ms
Lead Channel Setting Sensing Sensitivity: 0.5 mV
Pulse Gen Serial Number: 1042049
Zone Setting Detection Interval: 250 ms
Zone Setting Detection Interval: 300 ms

## 2014-09-22 LAB — TSH: TSH: 1.46 u[IU]/mL (ref 0.35–4.50)

## 2014-09-22 LAB — MAGNESIUM: Magnesium: 1.9 mg/dL (ref 1.5–2.5)

## 2014-09-22 MED ORDER — FUROSEMIDE 20 MG PO TABS: 20.0000 mg | ORAL_TABLET | Freq: Every day | ORAL | Status: DC

## 2014-09-22 NOTE — Patient Instructions (Addendum)
Medication Instructions:  Your physician recommends that you continue on your current medications as directed. Please refer to the Current Medication list given to you today.  Labwork: Medication surveillance labs today: TSH, Digoxin level, BMET, & CBCD  Testing/Procedures: None ordered  Follow-Up: Remote monitoring is used to monitor your Pacemaker of ICD from home. This monitoring reduces the number of office visits required to check your device to one time per year. It allows [place] to keep an eye on the functioning of your device to ensure it is working properly. You are scheduled for a device check from home on 12/22/14.12. You may send your transmission at any time that day. If you have a wireless device, the transmission will be sent automatically. After your physician reviews your transmission, you will receive a postcard with your next transmission date.  Your physician wants you to follow-up in: 1 year with Chanetta Marshall, NP.  You will receive a reminder letter in the mail two months in advance. If you don't receive a letter, please call our office to schedule the follow-up appointment.   Any Other Special Instructions Will Be Listed Below (If Applicable). Thank you for choosing [facility]!!   Trinidad Curet, RN [phone]

## 2014-09-22 NOTE — Progress Notes (Signed)
Patient Care Team: Lavone Orn, MD as PCP - General (Internal Medicine) Elmarie Shiley, MD (Nephrology) Deboraha Sprang, MD (Cardiology) Renato Shin, MD as Consulting Physician (Endocrinology)   HPI  Caleb Bell is a 59 y.o. male Seen in followup for ischemic cardiomyopathy, s/p CABG 2003 congestive heart failure and an IVCD with a broad QRS status post CRT-D implantation (March 2013).  At his last visit 3/15 reprogramming of the VV timing resulted in narrowing of the QRS  Echocardiogram 10/13 demonstrated no interval improvement with ejection fraction of 20-25%  Echocardiogram 12/15 EF 20-25% Carotid Dopplers 12/15 bilateral 40-59% stenosis   He also has a history of paroxysmal atrial fibrillation and renal insufficiency.  Has history of Hodgkin's disease and is status post chemotherapy and radiation therapy   The patient denies chest pain, shortness of breath, nocturnal dyspnea, orthopnea or peripheral edema. There have been no palpitations, lightheadedness or syncope.    Laboratories were reviewed creatinine 1.610/15 digoxin 1.01/16  He saw Dr. Laurann Montana at [place] [facility] a couple of weeks ago. Blood work was obtained.      Past Medical History  Diagnosis Date  . Hodgkin's disease      Dx aprox 2004, s/p XRT-Chemo  . CAD (coronary artery disease)     CABG 2003  . Hyperlipidemia     low HDL  . DM type 2 (diabetes mellitus, type 2)     Dr Loanne Drilling  . Hypothyroidism   . Polysubstance     Alcohol, cocaine- resoloved for many years   . Atrial fibrillation     Paroxysmal, limited  . IVCD (intraventricular conduction defect)   . CRI (chronic renal insufficiency)      multifactorial... Dr. Posey Pronto.. consult September 18, 2009  . Hyperkalemia     August, 2011, Aldactone, Aldactone stopped  . Chronic systolic heart failure     EF 20-25% echo 5- 2011  . Ischemic cardiomyopathy     MRI12/12 no real viability in hypo/akinetic segment  CAth native and graft disease  .  Elevated serum creatinine   . ICD (implantable cardiac defibrillator) in place     CRT-D placed March, 2013  . Ejection fraction < 50%     EF 25%, echo, November, 2012  //   planning followup 2-D echo to assess LV function with CRT D.  device in place  . Personal history of colonic adenomas 12/24/2011    Past Surgical History  Procedure Laterality Date  . Tonsillectomy    . Coronary artery bypass graft  2003  . Cystectomy      Upper Back  . Cardiac defibrillator placement    . Colonoscopy  12/24/2011    Procedure: COLONOSCOPY;  Surgeon: [name], MD;  Location: [facility] ENDOSCOPY;  Service: Endoscopy;  Laterality: N/A;  . Bi-ventricular implantable cardioverter defibrillator N/A 04/01/2011    Procedure: BI-VENTRICULAR IMPLANTABLE CARDIOVERTER DEFIBRILLATOR  (CRT-D);  Surgeon: Deboraha Sprang, MD;  Location: [facility] CATH LAB;  Service: Cardiovascular;  Laterality: N/A;    Current Outpatient Prescriptions  Medication Sig Dispense Refill  . apixaban (ELIQUIS) 5 MG TABS tablet Take 1 tablet (5 mg total) by mouth 2 (two) times daily. 180 tablet 2  . benazepril (LOTENSIN) 20 MG tablet Take 0.5 tablets (10 mg total) by mouth daily. 90 tablet 2  . carvedilol (COREG) 25 MG tablet TAKE ONE TABLET BY MOUTH TWICE DAILY with food. 180 tablet 2  . digoxin (LANOXIN) 0.125 MG tablet TAKE ONE TABLET BY MOUTH  ONE TIME DAILY. 90 tablet 1  . furosemide (LASIX) 20 MG tablet Take 20 mg by mouth daily.    Marland Kitchen glucose blood (ONE TOUCH ULTRA TEST) test strip Follow directions provided by physician twice daily 100 each 2  . levothyroxine (SYNTHROID, LEVOTHROID) 88 MCG tablet TAKE ONE TABLET BY MOUTH ONE TIME DAILY. 90 tablet 2  . NOVOFINE 32G X 6 MM MISC Test three times daily as directed. 100 each 10  . NOVOLOG FLEXPEN 100 UNIT/ML FlexPen INJECT 3 TIMES A DAY (JUST BEFORE EACH MEAL) 15-20-40 UNITS. AS DIRECTED. 30 mL 1  . ONETOUCH DELICA LANCETS FINE MISC TEST TWICE DAILY AS DIRECTED. 100 each 2  . rosuvastatin  (CRESTOR) 40 MG tablet Take one tablet daily (Patient taking differently: Take one tablet by mouth daily) 90 tablet 2  . [DISCONTINUED] glimepiride (AMARYL) 4 MG tablet 1.5 tablet a day 30 tablet 11   No current facility-administered medications for this visit.    No Known Allergies  Review of Systems negative except from HPI and PMH  Physical Exam BP 100/60 mmHg  Pulse 66  Ht 6\' 1"  (1.854 m)  Wt 192 lb 12.8 oz (87.454 kg)  BMI 25.44 kg/m2 Well developed and well nourished in no acute distress HENT normal E scleral and icterus clear Neck Supple JVP flat; carotids brisk and full Clear to ausculation The patient's device was interrogated.  The information was reviewed. No changes were made in the programming.     Regular rate and rhythm, no murmurs gallops or rub Soft with active bowel sounds No clubbing cyanosis none Edema Alert and oriented, grossly normal motor and sensory function Skin Warm and Dry  ECG demonstrates P. Synchronous pacing  Intervals 21/16/42 Upright QRS in lead V1 Paced AV delay 200 with a sensed AV delay 150   Assessment and  Plan  Atrial fibrillation  Ischemic cardiomyopathy  Congestive heart failure-chronic systolic  CRT-D.-[employer]  Thromboembolic risk factors   diabetes hypertension vascular idisease and CHF   CHADS-VASc score of greater than or equal to 4  Renal insufficiency grade 3   We will check a metabolic profile today and digoxin level. We will also check thyroids and CBC after discussions with Dr. Delene Ruffini office  Euvolemic continue current meds  Without symptoms of ischemia

## 2014-09-23 LAB — DIGOXIN LEVEL: Digoxin Level: 0.9 ug/L (ref 0.8–2.0)

## 2014-10-08 ENCOUNTER — Other Ambulatory Visit: Payer: Self-pay | Admitting: Endocrinology

## 2014-10-17 ENCOUNTER — Ambulatory Visit (INDEPENDENT_AMBULATORY_CARE_PROVIDER_SITE_OTHER): Payer: BLUE CROSS/BLUE SHIELD | Admitting: Endocrinology

## 2014-10-17 ENCOUNTER — Encounter: Payer: Self-pay | Admitting: Endocrinology

## 2014-10-17 VITALS — BP 134/86 | HR 106 | Temp 97.7°F | Ht 73.0 in | Wt 193.0 lb

## 2014-10-17 DIAGNOSIS — E1022 Type 1 diabetes mellitus with diabetic chronic kidney disease: Secondary | ICD-10-CM

## 2014-10-17 DIAGNOSIS — N189 Chronic kidney disease, unspecified: Secondary | ICD-10-CM | POA: Diagnosis not present

## 2014-10-17 LAB — POCT GLYCOSYLATED HEMOGLOBIN (HGB A1C): Hemoglobin A1C: 6

## 2014-10-17 MED ORDER — INSULIN ASPART 100 UNIT/ML FLEXPEN: PEN_INJECTOR | SUBCUTANEOUS | Status: DC

## 2014-10-17 NOTE — Progress Notes (Signed)
Subjective:    Patient ID: Caleb Bell, male    DOB: 05-22-1955, 59 y.o.   MRN: [id number]  HPI Pt returns for f/u of diabetes mellitus: DM type: 1 Dx'ed: 4431 Complications: polyneuropathy, renal insuff, foot ulcer, retinopathy, and CAD.   Therapy: insulin since 2010. DKA: never Severe hypoglycemia: never.   Pancreatitis: never.   Other: he takes multiple daily injections; he declines pump therapy.   Interval history: Meter is downloaded today, and the printout is scanned into the record.  He seldom has hypoglycemia, and these episodes are mild.  He says this happens after a smaller-than-expected meal.  This happens most commonly after dinner.   Past Medical History  Diagnosis Date  . Hodgkin's disease      Dx aprox 2004, s/p XRT-Chemo  . CAD (coronary artery disease)     CABG 2003  . Hyperlipidemia     low HDL  . DM type 2 (diabetes mellitus, type 2)     Dr Caleb Bell  . Hypothyroidism   . Polysubstance     Alcohol, cocaine- resoloved for many years   . Atrial fibrillation     Paroxysmal, limited  . IVCD (intraventricular conduction defect)   . CRI (chronic renal insufficiency)      multifactorial... Dr. Posey Bell.. consult September 18, 2009  . Hyperkalemia     August, 2011, Aldactone, Aldactone stopped  . Chronic systolic heart failure     EF 20-25% echo 5- 2011  . Ischemic cardiomyopathy     MRI12/12 no real viability in hypo/akinetic segment  CAth native and graft disease  . Elevated serum creatinine   . ICD (implantable cardiac defibrillator) in place     CRT-D placed March, 2013  . Ejection fraction < 50%     EF 25%, echo, November, 2012  //   planning followup 2-D echo to assess LV function with CRT D.  device in place  . Personal history of colonic adenomas 12/24/2011    Past Surgical History  Procedure Laterality Date  . Tonsillectomy    . Coronary artery bypass graft  2003  . Cystectomy      Upper Back  . Cardiac defibrillator placement    . Colonoscopy   12/24/2011    Procedure: COLONOSCOPY;  Surgeon: Caleb Mayer, MD;  Location: [facility] ENDOSCOPY;  Service: Endoscopy;  Laterality: N/A;  . Bi-ventricular implantable cardioverter defibrillator N/A 04/01/2011    Procedure: BI-VENTRICULAR IMPLANTABLE CARDIOVERTER DEFIBRILLATOR  (CRT-D);  Surgeon: Caleb Sprang, MD;  Location: [facility] CATH LAB;  Service: Cardiovascular;  Laterality: N/A;    Social History   Social History  . Marital Status: Single    Spouse Name: N/A  . Number of Children: 0  . Years of Education: N/A   Occupational History  . musician    Social History Main Topics  . Smoking status: Never Smoker   . Smokeless tobacco: Never Used  . Alcohol Use: No     Comment: Hx of abuse/clean since 2003   . Drug Use: No     Comment: Hx of abuse/clean for many years  . Sexual Activity: Not on file   Other Topics Concern  . 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 apixaban (ELIQUIS) 5 MG TABS tablet Take 1 tablet (5 mg total) by mouth 2 (two) times daily. 180 tablet 2  . benazepril (LOTENSIN) 20 MG tablet Take 0.5 tablets (10 mg total) by mouth daily. 90 tablet 2  . carvedilol (COREG) 25 MG tablet TAKE ONE TABLET BY MOUTH TWICE DAILY with food. 180 tablet 2  . digoxin (LANOXIN) 0.125 MG tablet TAKE ONE TABLET BY MOUTH ONE TIME DAILY. 90 tablet 1  . furosemide (LASIX) 20 MG tablet Take 1 tablet (20 mg total) by mouth daily. 90 tablet 1  . glucose blood (ONE TOUCH ULTRA TEST) test strip Follow directions provided by physician twice daily 100 each 2  . levothyroxine (SYNTHROID, LEVOTHROID) 88 MCG tablet TAKE ONE TABLET BY MOUTH ONE TIME DAILY. 90 tablet 2  . NOVOFINE 32G X 6 MM MISC Test three times daily as directed. 100 each 10  . ONETOUCH DELICA  LANCETS FINE MISC TEST TWICE DAILY AS DIRECTED. 100 each 2  . rosuvastatin (CRESTOR) 40 MG tablet Take one tablet daily (Patient taking differently: Take one tablet by mouth daily) 90 tablet 2  . [DISCONTINUED] glimepiride (AMARYL) 4 MG tablet 1.5 tablet a day 30 tablet 11   No current facility-administered medications on file prior to visit.    No Known Allergies  Family History  Problem Relation Age of Onset  . Adopted: Yes  . Family history unknown: Yes    BP 134/86 mmHg  Pulse 106  Temp(Src) 97.7 F (36.5 C) (Oral)  Ht 6\' 1"  (1.854 m)  Wt 193 lb (87.544 kg)  BMI 25.47 kg/m2  SpO2 97%    Review of Systems Denies LOC.    Objective:   Physical Exam VITAL SIGNS:  See vs page GENERAL: no distress Pulses: dorsalis pedis intact bilat.   MSK: no deformity of the feet CV: no leg edema Skin:  no ulcer on the feet.  normal color and temp on the feet.  Old healed surgical scar (vein harvest) at the right leg. Neuro: sensation is intact to touch on the feet, but decreased from normal  A1c=6.0%    Assessment & Plan:  DM: slightly overcontrolled.  Patient is advised the following: Patient Instructions  Please reduce the supper novolog to 30 units. check your blood sugar twice a day.  vary the time of day when you check, between before the 3 meals, and at bedtime.  also check if you have symptoms of your blood sugar being too high or too low.  please keep a record of the readings and bring it to your next appointment here.  You can write it on any piece of paper.  please call us sooner if your blood sugar goes below 70, or if you have a lot of readings over 200. Please come back for a follow-up appointment in 4-5 months.

## 2014-10-17 NOTE — Patient Instructions (Signed)
Please reduce the supper novolog to 30 units. check your blood sugar twice a day.  vary the time of day when you check, between before the 3 meals, and at bedtime.  also check if you have symptoms of your blood sugar being too high or too low.  please keep a record of the readings and bring it to your next appointment here.  You can write it on any piece of paper.  please call us sooner if your blood sugar goes below 70, or if you have a lot of readings over 200. Please come back for a follow-up appointment in 4-5 months.

## 2014-11-21 ENCOUNTER — Encounter: Payer: Self-pay | Admitting: Endocrinology

## 2014-11-21 ENCOUNTER — Ambulatory Visit (INDEPENDENT_AMBULATORY_CARE_PROVIDER_SITE_OTHER): Payer: BLUE CROSS/BLUE SHIELD | Admitting: Endocrinology

## 2014-11-21 VITALS — BP 132/84 | HR 77 | Resp 20 | Wt 194.4 lb

## 2014-11-21 DIAGNOSIS — E1022 Type 1 diabetes mellitus with diabetic chronic kidney disease: Secondary | ICD-10-CM

## 2014-11-21 DIAGNOSIS — N182 Chronic kidney disease, stage 2 (mild): Secondary | ICD-10-CM

## 2014-11-21 NOTE — Progress Notes (Signed)
Subjective:    Patient ID: Caleb Bell, male    DOB: 1955-09-20, 59 y.o.   MRN: [id number]  HPI Pt returns for f/u of diabetes mellitus: DM type: 1 Dx'ed: 2440 Complications: polyneuropathy, renal insuff, foot ulcer, retinopathy, PAD, and CAD.   Therapy: insulin since 2010.   DKA: never.   Severe hypoglycemia: never.    Pancreatitis: never.   Other: he takes multiple daily injections; he declines pump therapy; the pattern of his cbg's indicates he does not need basal insulin (prob due to renal insufficiency).    Interval history: since supper humalog was decreased, he has had no further episodes of hypoglycemia.  pt states he feels well in general.   Past Medical History  Diagnosis Date  . Hodgkin's disease      Dx aprox 2004, s/p XRT-Chemo  . CAD (coronary artery disease)     CABG 2003  . Hyperlipidemia     low HDL  . DM type 2 (diabetes mellitus, type 2) (HCC)     Dr Loanne Drilling  . Hypothyroidism   . Polysubstance     Alcohol, cocaine- resoloved for many years   . Atrial fibrillation (HCC)     Paroxysmal, limited  . IVCD (intraventricular conduction defect)   . CRI (chronic renal insufficiency)      multifactorial... Dr. Posey Pronto.. consult September 18, 2009  . Hyperkalemia     August, 2011, Aldactone, Aldactone stopped  . Chronic systolic heart failure (HCC)     EF 20-25% echo 5- 2011  . Ischemic cardiomyopathy     MRI12/12 no real viability in hypo/akinetic segment  CAth native and graft disease  . Elevated serum creatinine   . ICD (implantable cardiac defibrillator) in place     CRT-D placed March, 2013  . Ejection fraction < 50%     EF 25%, echo, November, 2012  //   planning followup 2-D echo to assess LV function with CRT D.  device in place  . Personal history of colonic adenomas 12/24/2011    Past Surgical History  Procedure Laterality Date  . Tonsillectomy    . Coronary artery bypass graft  2003  . Cystectomy      Upper Back  . Cardiac defibrillator  placement    . Colonoscopy  12/24/2011    Procedure: COLONOSCOPY;  Surgeon: [name], MD;  Location: [facility] ENDOSCOPY;  Service: Endoscopy;  Laterality: N/A;  . Bi-ventricular implantable cardioverter defibrillator N/A 04/01/2011    Procedure: BI-VENTRICULAR IMPLANTABLE CARDIOVERTER DEFIBRILLATOR  (CRT-D);  Surgeon: Deboraha Sprang, MD;  Location: [facility] CATH LAB;  Service: Cardiovascular;  Laterality: N/A;    Social History   Social History  . Marital Status: Single    Spouse Name: N/A  . Number of Children: 0  . Years of Education: N/A   Occupational History  . musician    Social History Main Topics  . Smoking status: Never Smoker   . Smokeless tobacco: Never Used  . Alcohol Use: No     Comment: Hx of abuse/clean since 2003   . Drug Use: No     Comment: Hx of abuse/clean for many years  . Sexual Activity: Not on file   Other Topics Concern  . 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 apixaban (ELIQUIS) 5 MG TABS tablet Take 1 tablet (5 mg total) by mouth 2 (two) times daily. 180 tablet 2  . benazepril (LOTENSIN) 20 MG tablet Take 0.5 tablets (10 mg total) by mouth daily. 90 tablet 2  . carvedilol (COREG) 25 MG tablet TAKE ONE TABLET BY MOUTH TWICE DAILY with food. 180 tablet 2  . digoxin (LANOXIN) 0.125 MG tablet TAKE ONE TABLET BY MOUTH ONE TIME DAILY. 90 tablet 1  . furosemide (LASIX) 20 MG tablet Take 1 tablet (20 mg total) by mouth daily. 90 tablet 1  . glucose blood (ONE TOUCH ULTRA TEST) test strip Follow directions provided by physician twice daily 100 each 2  . insulin aspart (NOVOLOG FLEXPEN) 100 UNIT/ML FlexPen INJECT 3 TIMES A DAY (JUST BEFORE EACH MEAL) 15-25-30 UNITS. 30 pen 1  . levothyroxine (SYNTHROID, LEVOTHROID) 88 MCG  tablet TAKE ONE TABLET BY MOUTH ONE TIME DAILY. 90 tablet 2  . NOVOFINE 32G X 6 MM MISC Test three times daily as directed. 100 each 10  . ONETOUCH DELICA LANCETS FINE MISC TEST TWICE DAILY AS DIRECTED. 100 each 2  . rosuvastatin (CRESTOR) 40 MG tablet Take one tablet daily (Patient taking differently: Take one tablet by mouth daily) 90 tablet 2  . [DISCONTINUED] glimepiride (AMARYL) 4 MG tablet 1.5 tablet a day 30 tablet 11   No current facility-administered medications on file prior to visit.    No Known Allergies  Family History  Problem Relation Age of Onset  . Adopted: Yes  . Family history unknown: Yes    BP 132/84 mmHg  Pulse 77  Resp 20  Wt 194 lb 6.4 oz (88.179 kg)  SpO2 99%  Review of Systems Denies weight change    Objective:   Physical Exam VITAL SIGNS:  See vs page GENERAL: no distress SKIN:  Insulin injection sites at the anterior abdomen are normal     Assessment & Plan:  DM: well-controlled  Patient is advised the following: Patient Instructions  Please continue the same insulin.  check your blood sugar twice a day.  vary the time of day when you check, between before the 3 meals, and at bedtime.  also check if you have symptoms of your blood sugar being too high or too low.  please keep a record of the readings and bring it to your next appointment here.  You can write it on any piece of paper.  please call us sooner if your blood sugar goes below 70, or if you have a lot of readings over 200.   Please come back for a follow-up appointment in 3 months.

## 2014-11-21 NOTE — Patient Instructions (Addendum)
Please continue the same insulin.  check your blood sugar twice a day.  vary the time of day when you check, between before the 3 meals, and at bedtime.  also check if you have symptoms of your blood sugar being too high or too low.  please keep a record of the readings and bring it to your next appointment here.  You can write it on any piece of paper.  please call us sooner if your blood sugar goes below 70, or if you have a lot of readings over 200.   Please come back for a follow-up appointment in 3 months.

## 2014-12-05 ENCOUNTER — Other Ambulatory Visit: Payer: Self-pay | Admitting: Endocrinology

## 2014-12-05 ENCOUNTER — Other Ambulatory Visit: Payer: Self-pay | Admitting: Internal Medicine

## 2014-12-19 ENCOUNTER — Ambulatory Visit: Payer: BLUE CROSS/BLUE SHIELD | Admitting: Physician Assistant

## 2014-12-19 ENCOUNTER — Other Ambulatory Visit: Payer: Self-pay | Admitting: Nurse Practitioner

## 2014-12-19 DIAGNOSIS — I6523 Occlusion and stenosis of bilateral carotid arteries: Secondary | ICD-10-CM

## 2014-12-22 ENCOUNTER — Ambulatory Visit (INDEPENDENT_AMBULATORY_CARE_PROVIDER_SITE_OTHER): Payer: BLUE CROSS/BLUE SHIELD | Admitting: *Deleted

## 2014-12-22 DIAGNOSIS — I255 Ischemic cardiomyopathy: Secondary | ICD-10-CM

## 2014-12-23 NOTE — Progress Notes (Signed)
Remote ICD transmission.   

## 2014-12-28 ENCOUNTER — Ambulatory Visit (HOSPITAL_COMMUNITY)
Admission: RE | Admit: 2014-12-28 | Discharge: 2014-12-28 | Disposition: A | Discharge status: Home / Self Care | Payer: BLUE CROSS/BLUE SHIELD | Source: Ambulatory Visit | Attending: Cardiology | Admitting: Cardiology

## 2014-12-28 DIAGNOSIS — E119 Type 2 diabetes mellitus without complications: Secondary | ICD-10-CM | POA: Diagnosis not present

## 2014-12-28 DIAGNOSIS — I6523 Occlusion and stenosis of bilateral carotid arteries: Secondary | ICD-10-CM | POA: Insufficient documentation

## 2014-12-28 DIAGNOSIS — E785 Hyperlipidemia, unspecified: Secondary | ICD-10-CM | POA: Insufficient documentation

## 2015-01-02 LAB — CUP PACEART REMOTE DEVICE CHECK
Battery Remaining Longevity: 24 mo
Battery Remaining Percentage: 32 %
Battery Voltage: 2.87 V
Brady Statistic AP VP Percent: 89 %
Brady Statistic AP VS Percent: 1 %
Brady Statistic AS VP Percent: 8.9 %
Brady Statistic AS VS Percent: 1 %
Brady Statistic RA Percent Paced: 88 %
Date Time Interrogation Session: 20161201070015
HighPow Impedance: 64 Ohm
HighPow Impedance: 64 Ohm
Implantable Lead Implant Date: 20130311
Implantable Lead Implant Date: 20130311
Implantable Lead Implant Date: 20130311
Implantable Lead Location: 753858
Implantable Lead Location: 753859
Implantable Lead Location: 753860
Implantable Lead Model: 7122
Lead Channel Impedance Value: 410 Ohm
Lead Channel Impedance Value: 430 Ohm
Lead Channel Impedance Value: 490 Ohm
Lead Channel Pacing Threshold Amplitude: 0.75 V
Lead Channel Pacing Threshold Amplitude: 1.25 V
Lead Channel Pacing Threshold Amplitude: 1.25 V
Lead Channel Pacing Threshold Pulse Width: 0.5 ms
Lead Channel Pacing Threshold Pulse Width: 0.5 ms
Lead Channel Pacing Threshold Pulse Width: 0.8 ms
Lead Channel Sensing Intrinsic Amplitude: 12 mV
Lead Channel Sensing Intrinsic Amplitude: 4 mV
Lead Channel Setting Pacing Amplitude: 2 V
Lead Channel Setting Pacing Amplitude: 2.25 V
Lead Channel Setting Pacing Amplitude: 2.5 V
Lead Channel Setting Pacing Pulse Width: 0.5 ms
Lead Channel Setting Pacing Pulse Width: 0.8 ms
Lead Channel Setting Sensing Sensitivity: 0.5 mV
Pulse Gen Serial Number: 1042049

## 2015-01-03 ENCOUNTER — Encounter: Payer: Self-pay | Admitting: Cardiology

## 2015-01-07 ENCOUNTER — Other Ambulatory Visit: Payer: Self-pay | Admitting: Internal Medicine

## 2015-01-22 DIAGNOSIS — N503 Cyst of epididymis: Secondary | ICD-10-CM

## 2015-01-22 HISTORY — DX: Cyst of epididymis: N50.3

## 2015-02-01 ENCOUNTER — Ambulatory Visit (INDEPENDENT_AMBULATORY_CARE_PROVIDER_SITE_OTHER): Payer: BLUE CROSS/BLUE SHIELD | Admitting: Family Medicine

## 2015-02-01 ENCOUNTER — Ambulatory Visit (INDEPENDENT_AMBULATORY_CARE_PROVIDER_SITE_OTHER): Payer: BLUE CROSS/BLUE SHIELD

## 2015-02-01 VITALS — BP 118/72 | HR 93 | Temp 98.5°F | Ht 72.5 in | Wt 196.0 lb

## 2015-02-01 DIAGNOSIS — M79641 Pain in right hand: Secondary | ICD-10-CM

## 2015-02-01 DIAGNOSIS — S66911A Strain of unspecified muscle, fascia and tendon at wrist and hand level, right hand, initial encounter: Secondary | ICD-10-CM

## 2015-02-01 DIAGNOSIS — S6991XA Unspecified injury of right wrist, hand and finger(s), initial encounter: Secondary | ICD-10-CM | POA: Diagnosis not present
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
## 2015-02-01 NOTE — Patient Instructions (Signed)
No lifting more than 10 pounds with your right hand. Wear the splint during the day and at night. Take off the splint twice a day and start some of the stretching exercises so that you do not get stiff.  Do not go past the point of pain, gradually this should diminish, once pain resolves completely, stop wearing splint and start the strengthening exercises.  If you are still having any pain in 2 weeks or if it gets worse at any point, come back.  You may want to see our sports medicine physician Dr. Nyoka Cowden or Tues/Wed evenings or we can refer you to a hand specialist if you have any trouble.  Wrist Sprain With Rehab A sprain is an injury in which a ligament that maintains the proper alignment of a joint is partially or completely torn. The ligaments of the wrist are susceptible to sprains. Sprains are classified into three categories. Grade 1 sprains cause pain, but the tendon is not lengthened. Grade 2 sprains include a lengthened ligament because the ligament is stretched or partially ruptured. With grade 2 sprains there is still function, although the function may be diminished. Grade 3 sprains are characterized by a complete tear of the tendon or muscle, and function is usually impaired. SYMPTOMS   Pain tenderness, inflammation, and/or bruising (contusion) of the injury.  A "pop" or tear felt and/or heard at the time of injury.  Decreased wrist function. CAUSES  A wrist sprain occurs when a force is placed on one or more ligaments that is greater than it/they can withstand. Common mechanisms of injury include:  Catching a ball with your hands.  Repetitive and/ or strenuous extension or flexion of the wrist. RISK INCREASES WITH:  Previous wrist injury.  Contact sports (boxing or wrestling).  Activities in which falling is common.  Poor strength and flexibility.  Improperly fitted or padded protective equipment. PREVENTION  Warm up and stretch properly before activity.  Allow for  adequate recovery between workouts.  Maintain physical fitness:  Strength, flexibility, and endurance.  Cardiovascular fitness.  Protect the wrist joint by limiting its motion with the use of taping, braces, or splints.  Protect the wrist after injury for 6 to 12 months. PROGNOSIS  The prognosis for wrist sprains depends on the degree of injury. Grade 1 sprains require 2 to 6 weeks of treatment. Grade 2 sprains require 6 to 8 weeks of treatment, and grade 3 sprains require up to 12 weeks.  RELATED COMPLICATIONS   Prolonged healing time, if improperly treated or re-injured.  Recurrent symptoms that result in a chronic problem.  Injury to nearby structures (bone, cartilage, nerves, or tendons).  Arthritis of the wrist.  Inability to compete in athletics at a high level.  Wrist stiffness or weakness.  Progression to a complete rupture of the ligament. TREATMENT  Treatment initially involves resting from any activities that aggravate the symptoms, and the use of ice and medications to help reduce pain and inflammation. Your caregiver may recommend immobilizing the wrist for a period of time in order to reduce stress on the ligament and allow for healing. After immobilization it is important to perform strengthening and stretching exercises to help regain strength and a full range of motion. These exercises may be completed at home or with a therapist. Surgery is not usually required for wrist sprains, unless the ligament has been ruptured (grade 3 sprain). MEDICATION   If pain medication is necessary, then nonsteroidal anti-inflammatory medications, such as aspirin and ibuprofen, or  other minor pain relievers, such as acetaminophen, are often recommended.  Do not take pain medication for 7 days before surgery.  Prescription pain relievers may be given if deemed necessary by your caregiver. Use only as directed and only as much as you need. HEAT AND COLD  Cold treatment (icing)  relieves pain and reduces inflammation. Cold treatment should be applied for 10 to 15 minutes every 2 to 3 hours for inflammation and pain and immediately after any activity that aggravates your symptoms. Use ice packs or massage the area with a piece of ice (ice massage).  Heat treatment may be used prior to performing the stretching and strengthening activities prescribed by your caregiver, physical therapist, or athletic trainer. Use a heat pack or soak your injury in warm water. SEEK MEDICAL CARE IF:  Treatment seems to offer no benefit, or the condition worsens.  Any medications produce adverse side effects. EXERCISES RANGE OF MOTION (ROM) AND STRETCHING EXERCISES - Wrist Sprain  These exercises may help you when beginning to rehabilitate your injury. Your symptoms may resolve with or without further involvement from your physician, physical therapist or athletic trainer. While completing these exercises, remember:   Restoring tissue flexibility helps normal motion to return to the joints. This allows healthier, less painful movement and activity.  An effective stretch should be held for at least 30 seconds.  A stretch should never be painful. You should only feel a gentle lengthening or release in the stretched tissue. RANGE OF MOTION - Wrist Flexion, Active-Assisted  Extend your right / left elbow with your fingers pointing down.*  Gently pull the back of your hand towards you until you feel a gentle stretch on the top of your forearm.  Hold this position for __________ seconds. Repeat __________ times. Complete this exercise __________ times per day.  *If directed by your physician, physical therapist or athletic trainer, complete this stretch with your elbow bent rather than extended. RANGE OF MOTION - Wrist Extension, Active-Assisted  Extend your right / left elbow and turn your palm upwards.*  Gently pull your palm/fingertips back so your wrist extends and your fingers point  more toward the ground.  You should feel a gentle stretch on the inside of your forearm.  Hold this position for __________ seconds. Repeat __________ times. Complete this exercise __________ times per day. *If directed by your physician, physical therapist or athletic trainer, complete this stretch with your elbow bent, rather than extended. RANGE OF MOTION - Supination, Active  Stand or sit with your elbows at your side. Bend your right / left elbow to 90 degrees.  Turn your palm upward until you feel a gentle stretch on the inside of your forearm.  Hold this position for __________ seconds. Slowly release and return to the starting position. Repeat __________ times. Complete this stretch __________ times per day.  RANGE OF MOTION - Pronation, Active  Stand or sit with your elbows at your side. Bend your right / left elbow to 90 degrees.  Turn your palm downward until you feel a gentle stretch on the top of your forearm.  Hold this position for __________ seconds. Slowly release and return to the starting position. Repeat __________ times. Complete this stretch __________ times per day.  STRETCH - Wrist Flexion  Place the back of your right / left hand on a tabletop leaving your elbow slightly bent. Your fingers should point away from your body.  Gently press the back of your hand down onto the table by  straightening your elbow. You should feel a stretch on the top of your forearm.  Hold this position for __________ seconds. Repeat __________ times. Complete this stretch __________ times per day.  STRETCH - Wrist Extension  Place your right / left fingertips on a tabletop leaving your elbow slightly bent. Your fingers should point backwards.  Gently press your fingers and palm down onto the table by straightening your elbow. You should feel a stretch on the inside of your forearm.  Hold this position for __________ seconds. Repeat __________ times. Complete this stretch  __________ times per day.  STRENGTHENING EXERCISES - Wrist Sprain These exercises may help you when beginning to rehabilitate your injury. They may resolve your symptoms with or without further involvement from your physician, physical therapist or athletic trainer. While completing these exercises, remember:   Muscles can gain both the endurance and the strength needed for everyday activities through controlled exercises.  Complete these exercises as instructed by your physician, physical therapist or athletic trainer. Progress with the resistance and repetition exercises only as your caregiver advises. STRENGTH - Wrist Flexors  Sit with your right / left forearm palm-up and fully supported. Your elbow should be resting below the height of your shoulder. Allow your wrist to extend over the edge of the surface.  Loosely holding a __________ weight or a piece of rubber exercise band/tubing, slowly curl your hand up toward your forearm.  Hold this position for __________ seconds. Slowly lower the wrist back to the starting position in a controlled manner. Repeat __________ times. Complete this exercise __________ times per day.  STRENGTH - Wrist Extensors  Sit with your right / left forearm palm-down and fully supported. Your elbow should be resting below the height of your shoulder. Allow your wrist to extend over the edge of the surface.  Loosely holding a __________ weight or a piece of rubber exercise band/tubing, slowly curl your hand up toward your forearm.  Hold this position for __________ seconds. Slowly lower the wrist back to the starting position in a controlled manner. Repeat __________ times. Complete this exercise __________ times per day.  STRENGTH - Ulnar Deviators  Stand with a ____________________ weight in your right / left hand, or sit holding on to the rubber exercise band/tubing with your opposite arm supported.  Move your wrist so that your pinkie travels toward your  forearm and your thumb moves away from your forearm.  Hold this position for __________ seconds and then slowly lower the wrist back to the starting position. Repeat __________ times. Complete this exercise __________ times per day STRENGTH - Radial Deviators  Stand with a ____________________ weight in your  right / left hand, or sit holding on to the rubber exercise band/tubing with your arm supported.  Raise your hand upward in front of you or pull up on the rubber tubing.  Hold this position for __________ seconds and then slowly lower the wrist back to the starting position. Repeat __________ times. Complete this exercise __________ times per day. STRENGTH - Forearm Supinators  Sit with your right / left forearm supported on a table, keeping your elbow below shoulder height. Rest your hand over the edge, palm down.  Gently grip a hammer or a soup ladle.  Without moving your elbow, slowly turn your palm and hand upward to a "thumbs-up" position.  Hold this position for __________ seconds. Slowly return to the starting position. Repeat __________ times. Complete this exercise __________ times per day.  STRENGTH - Forearm Pronators  Sit with your right / left forearm supported on a table, keeping your elbow below shoulder height. Rest your hand over the edge, palm up.  Gently grip a hammer or a soup ladle.  Without moving your elbow, slowly turn your palm and hand upward to a "thumbs-up" position.  Hold this position for __________ seconds. Slowly return to the starting position. Repeat __________ times. Complete this exercise __________ times per day.  STRENGTH - Grip  Grasp a tennis ball, a dense sponge, or a large, rolled sock in your hand.  Squeeze as hard as you can without increasing any pain.  Hold this position for __________ seconds. Release your grip slowly. Repeat __________ times. Complete this exercise __________ times per day.    This information is not  intended to replace advice given to you by your health care provider. Make sure you discuss any questions you have with your health care provider.   Document Released: 01/07/2005 Document Revised: 09/28/2014 Document Reviewed: 04/21/2008 Elsevier Interactive Patient Education [employer].

## 2015-02-02 ENCOUNTER — Ambulatory Visit (INDEPENDENT_AMBULATORY_CARE_PROVIDER_SITE_OTHER): Payer: BLUE CROSS/BLUE SHIELD | Admitting: Internal Medicine

## 2015-02-02 ENCOUNTER — Encounter: Payer: Self-pay | Admitting: Internal Medicine

## 2015-02-02 VITALS — BP 116/64 | HR 74 | Ht 72.5 in | Wt 196.0 lb

## 2015-02-02 DIAGNOSIS — I25709 Atherosclerosis of coronary artery bypass graft(s), unspecified, with unspecified angina pectoris: Secondary | ICD-10-CM | POA: Diagnosis not present

## 2015-02-02 DIAGNOSIS — I5022 Chronic systolic (congestive) heart failure: Secondary | ICD-10-CM | POA: Diagnosis not present

## 2015-02-02 NOTE — Progress Notes (Signed)
Cardiology Office Note   Date:  02/02/2015   ID:  Caleb Bell, DOB 11-Aug-1955, MRN [id number]  PCP:  Irven Shelling, MD  Cardiologist:   Dorris Carnes, MD   F?U of CAD and systolic CHF      History of Present Illness: Caleb Bell is a 60 y.o. male with a history ofschemic cardiomyopathy, s/p CABG 2003 congestive heart failure and an IVCD with a broad QRS status post CRT-D implantation (March 2013). He is followed by Olin Pia  Was followed by Veatrice Bourbon   He also has a history of PAF, renal insuff, Hodkins dz (s/p chemo and XRT)   Echocardiogram 12/15 EF 20-25% Carotid Dopplers 12/15 bilateral 40-59% stenosis  Since he was in clinic last he says his breathing has been good  No CP   No dizziness  Fairly active      Current Outpatient Prescriptions  Medication Sig Dispense Refill  . apixaban (ELIQUIS) 5 MG TABS tablet Take 1 tablet (5 mg total) by mouth 2 (two) times daily. 180 tablet 0  . benazepril (LOTENSIN) 20 MG tablet Take 0.5 tablets (10 mg total) by mouth daily. 90 tablet 2  . carvedilol (COREG) 25 MG tablet Take 1 tablet (25 mg total) by mouth 2 (two) times daily with a meal. 60 tablet 0  . digoxin (LANOXIN) 0.125 MG tablet TAKE ONE TABLET BY MOUTH ONE TIME DAILY. 90 tablet 1  . furosemide (LASIX) 20 MG tablet Take 1 tablet (20 mg total) by mouth daily. 90 tablet 1  . insulin aspart (NOVOLOG FLEXPEN) 100 UNIT/ML FlexPen INJECT 3 TIMES A DAY (JUST BEFORE EACH MEAL) 15-25-30 UNITS. 30 pen 1  . levothyroxine (SYNTHROID, LEVOTHROID) 88 MCG tablet Take 1 tablet (88 mcg total) by mouth daily before breakfast. 30 tablet 0  . NOVOFINE 32G X 6 MM MISC Test three times daily as directed. 100 each 10  . ONE TOUCH ULTRA TEST test strip USE AS DIRECTED TWICE DAILY. 100 each 3  . ONETOUCH DELICA LANCETS FINE MISC TEST TWICE DAILY AS DIRECTED. 100 each 2  . rosuvastatin (CRESTOR) 40 MG tablet Take one tablet daily (Patient taking differently: Take one tablet by mouth daily) 90  tablet 2  . [DISCONTINUED] glimepiride (AMARYL) 4 MG tablet 1.5 tablet a day 30 tablet 11   No current facility-administered medications for this visit.    Allergies:   Review of patient's allergies indicates no known allergies.   Past Medical History  Diagnosis Date  . Hodgkin's disease      Dx aprox 2004, s/p XRT-Chemo  . CAD (coronary artery disease)     CABG 2003  . Hyperlipidemia     low HDL  . DM type 2 (diabetes mellitus, type 2) (HCC)     Dr Loanne Drilling  . Hypothyroidism   . Polysubstance     Alcohol, cocaine- resoloved for many years   . Atrial fibrillation (HCC)     Paroxysmal, limited  . IVCD (intraventricular conduction defect)   . CRI (chronic renal insufficiency)      multifactorial... Dr. Posey Pronto.. consult September 18, 2009  . Hyperkalemia     August, 2011, Aldactone, Aldactone stopped  . Chronic systolic heart failure (HCC)     EF 20-25% echo 5- 2011  . Ischemic cardiomyopathy     MRI12/12 no real viability in hypo/akinetic segment  CAth native and graft disease  . Elevated serum creatinine   . ICD (implantable cardiac defibrillator) in place     CRT-D  placed March, 2013  . Ejection fraction < 50%     EF 25%, echo, November, 2012  //   planning followup 2-D echo to assess LV function with CRT D.  device in place  . Personal history of colonic adenomas 12/24/2011    Past Surgical History  Procedure Laterality Date  . Tonsillectomy    . Coronary artery bypass graft  2003  . Cystectomy      Upper Back  . Cardiac defibrillator placement    . Colonoscopy  12/24/2011    Procedure: COLONOSCOPY;  Surgeon: [name], MD;  Location: [facility];  Service: Endoscopy;  Laterality: N/A;  . Bi-ventricular implantable cardioverter defibrillator N/A 04/01/2011    Procedure: BI-VENTRICULAR IMPLANTABLE CARDIOVERTER DEFIBRILLATOR  (CRT-D);  Surgeon: Deboraha Sprang, MD;  Location: [facility] CATH LAB;  Service: Cardiovascular;  Laterality: N/A;     Social History:  The patient   reports that he has never smoked. He has never used smokeless tobacco. He reports that he does not drink alcohol or use illicit drugs.   Family History:  The patient's He was adopted. Family history is unknown by patient.    ROS:  Please see the history of present illness. All other systems are reviewed and  Negative to the above problem except as noted.    PHYSICAL EXAM: VS:  BP 116/64 mmHg  Pulse 74  Ht 6' 0.5" (1.842 m)  Wt 88.905 kg (196 lb)  BMI 26.20 kg/m2  SpO2 98%  GEN: Well nourished, well developed, in no acute distress HEENT: normal Neck: no JVD, carotid bruits, or masses Cardiac: RRR; no murmurs, rubs, or gallops,no edema  Respiratory:  clear to auscultation bilaterally, normal work of breathing GI: soft, nontender, nondistended, + BS  No hepatomegaly  MS: no deformity Moving all extremities   Skin: warm and dry, no rash Neuro:  Strength and sensation are intact Psych: euthymic mood, full affect   EKG:  EKG is not ordered today.   Lipid Panel    Component Value Date/Time   CHOL 105 11/15/2013 0911   TRIG 169.0* 11/15/2013 0911   TRIG 306* 11/06/2005 1313   HDL 28.80* 11/15/2013 0911   CHOLHDL 4 11/15/2013 0911   CHOLHDL 6.6 CALC 11/06/2005 1313   VLDL 33.8 11/15/2013 0911   LDLCALC 42 11/15/2013 0911   LDLDIRECT 66.3 11/11/2012 0932   LDLDIRECT 119.3 11/06/2005 1313      Wt Readings from Last 3 Encounters:  02/02/15 88.905 kg (196 lb)  02/01/15 88.905 kg (196 lb)  11/21/14 88.179 kg (194 lb 6.4 oz)      ASSESSMENT AND PLAN:  1  CAD  No symtpoms of angina  Follow  2.  Chronic systolic CHF  Volume status looks good  I would keeep on same reigmen  3.  HL  Will need to get labs in future from Dr Laurann Montana  He is being switched from Crestor to ? Due to cost    4  DM  Followed in IM   I encouraged him to stay active   He has device check in March, F/U with A Seiler in fall  I will see him next winter    Signed, Dorris Carnes, MD  02/02/2015 11:28 AM     [facility] [place], [place], [place]  [address] Phone: [phone]; Fax: [phone]

## 2015-02-02 NOTE — Patient Instructions (Signed)
Your physician recommends that you continue on your current medications as directed. Please refer to the Current Medication list given to you today. Your physician wants you to follow-up in: 1 year (Feb 2018) with Dr. Harrington Challenger.  You will receive a reminder letter in the mail two months in advance. If you don't receive a letter, please call our office to schedule the follow-up appointment.

## 2015-02-20 ENCOUNTER — Other Ambulatory Visit: Payer: Self-pay | Admitting: *Deleted

## 2015-02-20 ENCOUNTER — Other Ambulatory Visit: Payer: Self-pay | Admitting: Internal Medicine

## 2015-02-20 MED ORDER — CARVEDILOL 25 MG PO TABS: 25.0000 mg | ORAL_TABLET | Freq: Two times a day (BID) | ORAL | Status: DC

## 2015-02-20 NOTE — Telephone Encounter (Signed)
Dr. Harrington Challenger saw him this month.  OK to refill from her.

## 2015-02-20 NOTE — Telephone Encounter (Signed)
Pharmacy requesting Dr Harrington Challenger to refill this medication for the patient, however I do not see where she has ever refilled this for the patient. Please advise. Thanks, MI

## 2015-03-03 ENCOUNTER — Other Ambulatory Visit: Payer: Self-pay | Admitting: Endocrinology

## 2015-03-21 ENCOUNTER — Ambulatory Visit (INDEPENDENT_AMBULATORY_CARE_PROVIDER_SITE_OTHER): Payer: BLUE CROSS/BLUE SHIELD | Admitting: Endocrinology

## 2015-03-21 ENCOUNTER — Encounter: Payer: Self-pay | Admitting: Endocrinology

## 2015-03-21 VITALS — BP 124/80 | HR 93 | Temp 97.5°F | Ht 72.5 in | Wt 192.0 lb

## 2015-03-21 DIAGNOSIS — N182 Chronic kidney disease, stage 2 (mild): Secondary | ICD-10-CM

## 2015-03-21 DIAGNOSIS — E1022 Type 1 diabetes mellitus with diabetic chronic kidney disease: Secondary | ICD-10-CM

## 2015-03-21 DIAGNOSIS — N183 Chronic kidney disease, stage 3 (moderate): Secondary | ICD-10-CM | POA: Diagnosis not present

## 2015-03-21 DIAGNOSIS — Z794 Long term (current) use of insulin: Secondary | ICD-10-CM

## 2015-03-21 DIAGNOSIS — E1122 Type 2 diabetes mellitus with diabetic chronic kidney disease: Secondary | ICD-10-CM

## 2015-03-21 LAB — POCT GLYCOSYLATED HEMOGLOBIN (HGB A1C): Hemoglobin A1C: 6.2

## 2015-03-21 MED ORDER — INSULIN ASPART 100 UNIT/ML FLEXPEN: PEN_INJECTOR | SUBCUTANEOUS | Status: DC

## 2015-03-21 NOTE — Patient Instructions (Signed)
Please reduce the novolog to 3 times a day (just before each meal) 11-09-28 units.   check your blood sugar twice a day.  vary the time of day when you check, between before the 3 meals, and at bedtime.  also check if you have symptoms of your blood sugar being too high or too low.  please keep a record of the readings and bring it to your next appointment here.  You can write it on any piece of paper.  please call us sooner if your blood sugar goes below 70, or if you have a lot of readings over 200.   Please come back for a follow-up appointment in 6 months.

## 2015-03-21 NOTE — Progress Notes (Signed)
Subjective:    Patient ID: Caleb Bell, male    DOB: [birth year], 60 y.o.   MRN: [id number]  HPI Pt returns for f/u of diabetes mellitus: DM type: 1 Dx'ed: 123456 Complications: polyneuropathy, renal insuff, foot ulcer, retinopathy, PAD, and CAD.   Therapy: insulin since 2010.   DKA: never.   Severe hypoglycemia: never.    Pancreatitis: never.   Other: he takes multiple daily injections; he declines pump therapy; the pattern of his cbg's indicates he does not need basal insulin (prob due to renal insufficiency).    Interval history: pt states he feels well in general.  Meter is downloaded today, and the printout is scanned into the record.  This is scant, but pt says cbg's are lowest at lunch.  He seldom has hypoglycemia, and these episodes are mild.  Past Medical History  Diagnosis Date  . Hodgkin's disease      Dx aprox 2004, s/p XRT-Chemo  . CAD (coronary artery disease)     CABG 2003  . Hyperlipidemia     low HDL  . DM type 2 (diabetes mellitus, type 2) (HCC)     Dr Loanne Drilling  . Hypothyroidism   . Polysubstance     Alcohol, cocaine- resoloved for many years   . Atrial fibrillation (HCC)     Paroxysmal, limited  . IVCD (intraventricular conduction defect)   . CRI (chronic renal insufficiency)      multifactorial... Dr. Posey Pronto.. consult September 18, 2009  . Hyperkalemia     August, 2011, Aldactone, Aldactone stopped  . Chronic systolic heart failure (HCC)     EF 20-25% echo 5- 2011  . Ischemic cardiomyopathy     MRI12/12 no real viability in hypo/akinetic segment  CAth native and graft disease  . Elevated serum creatinine   . ICD (implantable cardiac defibrillator) in place     CRT-D placed March, 2013  . Ejection fraction < 50%     EF 25%, echo, November, 2012  //   planning followup 2-D echo to assess LV function with CRT D.  device in place  . Personal history of colonic adenomas 12/24/2011    Past Surgical History  Procedure Laterality Date  . Tonsillectomy    .  Coronary artery bypass graft  2003  . Cystectomy      Upper Back  . Cardiac defibrillator placement    . Colonoscopy  12/24/2011    Procedure: COLONOSCOPY;  Surgeon: [name], MD;  Location: [facility];  Service: Endoscopy;  Laterality: N/A;  . Bi-ventricular implantable cardioverter defibrillator N/A 04/01/2011    Procedure: BI-VENTRICULAR IMPLANTABLE CARDIOVERTER DEFIBRILLATOR  (CRT-D);  Surgeon: Deboraha Sprang, MD;  Location: [facility] CATH LAB;  Service: Cardiovascular;  Laterality: N/A;    Social History   Social History  . Marital Status: Single    Spouse Name: N/A  . Number of Children: 0  . Years of Education: N/A   Occupational History  . musician    Social History Main Topics  . Smoking status: Never Smoker   . Smokeless tobacco: Never Used  . Alcohol Use: No     Comment: Hx of abuse/clean since 2003   . Drug Use: No     Comment: Hx of abuse/clean for many years  . Sexual Activity: Not on file   Other Topics Concern  . 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 apixaban (ELIQUIS) 5 MG TABS tablet Take 1 tablet (5 mg total) by mouth 2 (two) times daily. 180 tablet 0  . benazepril (LOTENSIN) 20 MG tablet Take 0.5 tablets (10 mg total) by mouth daily. 90 tablet 2  . carvedilol (COREG) 25 MG tablet TAKE 1 TABLET (25 MG TOTAL) BY MOUTH 2 (TWO) TIMES DAILY WITH A MEAL. 60 tablet 11  . digoxin (LANOXIN) 0.125 MG tablet TAKE ONE TABLET BY MOUTH ONE TIME DAILY. 90 tablet 1  . furosemide (LASIX) 20 MG tablet Take 1 tablet (20 mg total) by mouth daily. 90 tablet 1  . levothyroxine (SYNTHROID, LEVOTHROID) 88 MCG tablet Take 1 tablet (88 mcg total) by mouth daily before breakfast. 30 tablet 0  . NOVOFINE 32G X 6 MM MISC TEST THREE TIMES  DAILY AS DIRECTED 100 each 2  . ONE TOUCH ULTRA TEST test strip USE AS DIRECTED TWICE DAILY. 100 each 3  . ONETOUCH DELICA LANCETS FINE MISC TEST TWICE DAILY AS DIRECTED. 100 each 2  . rosuvastatin (CRESTOR) 40 MG tablet Take one tablet daily (Patient not taking: Reported on 03/21/2015) 90 tablet 2  . [DISCONTINUED] glimepiride (AMARYL) 4 MG tablet 1.5 tablet a day 30 tablet 11   No current facility-administered medications on file prior to visit.    No Known Allergies  Family History  Problem Relation Age of Onset  . Adopted: Yes  . Family history unknown: Yes    BP 124/80 mmHg  Pulse 93  Temp(Src) 97.5 F (36.4 C) (Oral)  Ht 6' 0.5" (1.842 m)  Wt 192 lb (87.091 kg)  BMI 25.67 kg/m2  SpO2 96%  Review of Systems Denies LOC    Objective:   Physical Exam VITAL SIGNS:  See vs page GENERAL: no distress Pulses: dorsalis pedis intact bilat.   MSK: no deformity of the feet CV: no leg edema Skin:  no ulcer on the feet.  normal color and temp on the feet.  Old healed surgical scar (vein harvest) at the right leg.   Neuro: sensation is intact to touch on the feet   Lab Results  Component Value Date   CREATININE 1.68* 09/22/2014   BUN 33* 09/22/2014   NA 134* 09/22/2014   K 4.9 09/22/2014   CL 99 09/22/2014   CO2 27 09/22/2014   A1c=6.2%    Assessment & Plan:  DM: overcontrolled Renal insuff: in this setting (and according to his cbg pattern), he does not need basal insulin.    Patient is advised the following: Patient Instructions  Please reduce the novolog to 3 times a day (just before each meal) 11-09-28 units.   check your blood sugar twice a day.  vary the time of day when you check, between before the 3 meals, and at bedtime.  also check if you have symptoms of your blood sugar being too high or too low.  please keep a record of the readings and bring it to your next appointment here.  You can write it on any piece of paper.  please call us sooner if your blood  sugar goes below 70, or if you have a lot of readings over 200.   Please come back for a follow-up appointment in 6 months.

## 2015-03-22 DIAGNOSIS — E119 Type 2 diabetes mellitus without complications: Secondary | ICD-10-CM | POA: Insufficient documentation

## 2015-03-23 ENCOUNTER — Telehealth: Payer: Self-pay | Admitting: Cardiology

## 2015-03-23 ENCOUNTER — Ambulatory Visit (INDEPENDENT_AMBULATORY_CARE_PROVIDER_SITE_OTHER): Payer: BLUE CROSS/BLUE SHIELD | Admitting: *Deleted

## 2015-03-23 DIAGNOSIS — I5022 Chronic systolic (congestive) heart failure: Secondary | ICD-10-CM

## 2015-03-23 DIAGNOSIS — I255 Ischemic cardiomyopathy: Secondary | ICD-10-CM

## 2015-03-23 NOTE — Telephone Encounter (Signed)
Returning your call. °

## 2015-03-23 NOTE — Telephone Encounter (Signed)
LMOVM reminding pt to send remote transmission.   

## 2015-03-23 NOTE — Telephone Encounter (Signed)
Spoke w/ pt and instructed him how to send manual transmission. Pt verbalized understanding.  

## 2015-03-24 NOTE — Progress Notes (Signed)
Remote ICD transmission.   

## 2015-04-01 LAB — CUP PACEART REMOTE DEVICE CHECK
Battery Remaining Longevity: 22 mo
Battery Remaining Percentage: 30 %
Battery Voltage: 2.86 V
Brady Statistic AP VP Percent: 89 %
Brady Statistic AP VS Percent: 1 %
Brady Statistic AS VP Percent: 8.5 %
Brady Statistic AS VS Percent: 1 %
Brady Statistic RA Percent Paced: 89 %
Date Time Interrogation Session: 20170303011125
HighPow Impedance: 66 Ohm
HighPow Impedance: 66 Ohm
Implantable Lead Implant Date: 20130311
Implantable Lead Implant Date: 20130311
Implantable Lead Implant Date: 20130311
Implantable Lead Location: 753858
Implantable Lead Location: 753859
Implantable Lead Location: 753860
Implantable Lead Model: 7122
Lead Channel Impedance Value: 400 Ohm
Lead Channel Impedance Value: 440 Ohm
Lead Channel Impedance Value: 450 Ohm
Lead Channel Pacing Threshold Amplitude: 0.625 V
Lead Channel Pacing Threshold Amplitude: 1.125 V
Lead Channel Pacing Threshold Amplitude: 1.25 V
Lead Channel Pacing Threshold Pulse Width: 0.5 ms
Lead Channel Pacing Threshold Pulse Width: 0.5 ms
Lead Channel Pacing Threshold Pulse Width: 0.8 ms
Lead Channel Sensing Intrinsic Amplitude: 11.7 mV
Lead Channel Sensing Intrinsic Amplitude: 3.8 mV
Lead Channel Setting Pacing Amplitude: 2 V
Lead Channel Setting Pacing Amplitude: 2.125
Lead Channel Setting Pacing Amplitude: 2.5 V
Lead Channel Setting Pacing Pulse Width: 0.5 ms
Lead Channel Setting Pacing Pulse Width: 0.8 ms
Lead Channel Setting Sensing Sensitivity: 0.5 mV
Pulse Gen Serial Number: 1042049

## 2015-04-01 NOTE — Progress Notes (Signed)
Normal remote reviewed. [place] abnormal - will enroll in [facility] clinic  Next [facility] 06/22/15

## 2015-04-05 ENCOUNTER — Encounter: Payer: Self-pay | Admitting: Cardiology

## 2015-04-15 ENCOUNTER — Other Ambulatory Visit: Payer: Self-pay | Admitting: Internal Medicine

## 2015-06-22 ENCOUNTER — Ambulatory Visit (INDEPENDENT_AMBULATORY_CARE_PROVIDER_SITE_OTHER): Payer: BLUE CROSS/BLUE SHIELD | Admitting: *Deleted

## 2015-06-22 ENCOUNTER — Telehealth: Payer: Self-pay | Admitting: Cardiology

## 2015-06-22 DIAGNOSIS — I5022 Chronic systolic (congestive) heart failure: Secondary | ICD-10-CM

## 2015-06-22 DIAGNOSIS — I255 Ischemic cardiomyopathy: Secondary | ICD-10-CM

## 2015-06-22 NOTE — Progress Notes (Signed)
Remote ICD transmission.   

## 2015-06-22 NOTE — Telephone Encounter (Signed)
Spoke with pt and reminded pt of remote transmission that is due today. Pt verbalized understanding.   

## 2015-07-07 LAB — CUP PACEART REMOTE DEVICE CHECK
Battery Remaining Longevity: 18 mo
Battery Remaining Percentage: 27 %
Battery Voltage: 2.83 V
Brady Statistic AP VP Percent: 89 %
Brady Statistic AP VS Percent: 1 %
Brady Statistic AS VP Percent: 8.7 %
Brady Statistic AS VS Percent: 1 %
Brady Statistic RA Percent Paced: 89 %
Date Time Interrogation Session: 20170601183532
HighPow Impedance: 66 Ohm
HighPow Impedance: 66 Ohm
Implantable Lead Implant Date: 20130311
Implantable Lead Implant Date: 20130311
Implantable Lead Implant Date: 20130311
Implantable Lead Location: 753858
Implantable Lead Location: 753859
Implantable Lead Location: 753860
Implantable Lead Model: 7122
Lead Channel Impedance Value: 410 Ohm
Lead Channel Impedance Value: 410 Ohm
Lead Channel Impedance Value: 480 Ohm
Lead Channel Pacing Threshold Amplitude: 0.625 V
Lead Channel Pacing Threshold Amplitude: 1.125 V
Lead Channel Pacing Threshold Amplitude: 1.25 V
Lead Channel Pacing Threshold Pulse Width: 0.5 ms
Lead Channel Pacing Threshold Pulse Width: 0.5 ms
Lead Channel Pacing Threshold Pulse Width: 0.8 ms
Lead Channel Sensing Intrinsic Amplitude: 12 mV
Lead Channel Sensing Intrinsic Amplitude: 4.1 mV
Lead Channel Setting Pacing Amplitude: 2 V
Lead Channel Setting Pacing Amplitude: 2.125
Lead Channel Setting Pacing Amplitude: 2.5 V
Lead Channel Setting Pacing Pulse Width: 0.5 ms
Lead Channel Setting Pacing Pulse Width: 0.8 ms
Lead Channel Setting Sensing Sensitivity: 0.5 mV
Pulse Gen Serial Number: 1042049

## 2015-07-12 ENCOUNTER — Encounter: Payer: Self-pay | Admitting: Cardiology

## 2015-09-01 ENCOUNTER — Other Ambulatory Visit: Payer: Self-pay | Admitting: Internal Medicine

## 2015-09-05 ENCOUNTER — Ambulatory Visit: Payer: BLUE CROSS/BLUE SHIELD | Admitting: Endocrinology

## 2015-09-15 ENCOUNTER — Encounter: Payer: Self-pay | Admitting: Endocrinology

## 2015-09-15 ENCOUNTER — Ambulatory Visit (INDEPENDENT_AMBULATORY_CARE_PROVIDER_SITE_OTHER): Payer: BLUE CROSS/BLUE SHIELD | Admitting: Endocrinology

## 2015-09-15 VITALS — BP 126/72 | HR 78 | Ht 73.0 in | Wt 200.0 lb

## 2015-09-15 DIAGNOSIS — Z794 Long term (current) use of insulin: Secondary | ICD-10-CM | POA: Diagnosis not present

## 2015-09-15 DIAGNOSIS — N183 Chronic kidney disease, stage 3 unspecified: Secondary | ICD-10-CM

## 2015-09-15 DIAGNOSIS — E1122 Type 2 diabetes mellitus with diabetic chronic kidney disease: Secondary | ICD-10-CM

## 2015-09-15 LAB — POCT GLYCOSYLATED HEMOGLOBIN (HGB A1C): Hemoglobin A1C: 7

## 2015-09-15 MED ORDER — INSULIN ASPART 100 UNIT/ML FLEXPEN: PEN_INJECTOR | SUBCUTANEOUS | 1 refills | Status: DC

## 2015-09-15 NOTE — Patient Instructions (Addendum)
Please continue the same insulin.  check your blood sugar twice a day.  vary the time of day when you check, between before the 3 meals, and at bedtime.  also check if you have symptoms of your blood sugar being too high or too low.  please keep a record of the readings and bring it to your next appointment here.  You can write it on any piece of paper.  please call us sooner if your blood sugar goes below 70, or if you have a lot of readings over 200.   Please come back for a follow-up appointment in 6 months.

## 2015-09-15 NOTE — Progress Notes (Signed)
Subjective:    Patient ID: Caleb Bell, male    DOB: May 25, 1955, 60 y.o.   MRN: [id number]  HPI Pt returns for f/u of diabetes mellitus: DM type: 1 Dx'ed: 123456 Complications: polyneuropathy, renal insuff, foot ulcer, retinopathy, PAD, and CAD.   Therapy: insulin since 2010.   DKA: never.   Severe hypoglycemia: never.    Pancreatitis: never.   Other: he takes multiple daily injections; he declines pump therapy; the pattern of his cbg's indicates he does not need basal insulin (prob due to renal insufficiency).    Interval history: pt states he feels well in general. Meter is downloaded today, and the printout is scanned into the record.  It varies from 90-180.  It is highest fasting.   Past Medical History:  Diagnosis Date  . Atrial fibrillation (HCC)    Paroxysmal, limited  . CAD (coronary artery disease)    CABG 2003  . Chronic systolic heart failure (HCC)    EF 20-25% echo 5- 2011  . CRI (chronic renal insufficiency)     multifactorial... Dr. Posey Pronto.. consult September 18, 2009  . DM type 2 (diabetes mellitus, type 2) (HCC)    Dr Loanne Drilling  . Ejection fraction < 50%    EF 25%, echo, November, 2012  //   planning followup 2-D echo to assess LV function with CRT D.  device in place  . Elevated serum creatinine   . Hodgkin's disease     Dx aprox 2004, s/p XRT-Chemo  . Hyperkalemia    August, 2011, Aldactone, Aldactone stopped  . Hyperlipidemia    low HDL  . Hypothyroidism   . ICD (implantable cardiac defibrillator) in place    CRT-D placed March, 2013  . Ischemic cardiomyopathy    MRI12/12 no real viability in hypo/akinetic segment  CAth native and graft disease  . IVCD (intraventricular conduction defect)   . Personal history of colonic adenomas 12/24/2011  . Polysubstance    Alcohol, cocaine- resoloved for many years     Past Surgical History:  Procedure Laterality Date  . BI-VENTRICULAR IMPLANTABLE CARDIOVERTER DEFIBRILLATOR N/A 04/01/2011   Procedure:  BI-VENTRICULAR IMPLANTABLE CARDIOVERTER DEFIBRILLATOR  (CRT-D);  Surgeon: Deboraha Sprang, MD;  Location: [facility] CATH LAB;  Service: Cardiovascular;  Laterality: N/A;  . CARDIAC DEFIBRILLATOR PLACEMENT    . COLONOSCOPY  12/24/2011   Procedure: COLONOSCOPY;  Surgeon: [name], MD;  Location: [facility];  Service: Endoscopy;  Laterality: N/A;  . CORONARY ARTERY BYPASS GRAFT  2003  . CYSTECTOMY     Upper Back  . TONSILLECTOMY      Social History   Social History  . Marital status: Single    Spouse name: N/A  . Number of children: 0  . Years of education: N/A   Occupational History  . musician    Social History Main Topics  . Smoking status: Never Smoker  . Smokeless tobacco: Never Used  . Alcohol use No     Comment: Hx of abuse/clean since 2003   . Drug use: No     Comment: Hx of abuse/clean for many years  . Sexual activity: Not on file   Other Topics Concern  . 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 apixaban (ELIQUIS) 5 MG TABS tablet Take 1 tablet (5 mg total) by mouth 2 (two) times daily. 180 tablet 0  . atorvastatin (LIPITOR) 80 MG tablet Take 80 mg by mouth daily.  6  . benazepril (LOTENSIN) 20 MG tablet Take 0.5 tablets (10 mg total) by mouth daily. 90 tablet 2  . carvedilol (COREG) 25 MG tablet TAKE 1 TABLET (25 MG TOTAL) BY MOUTH 2 (TWO) TIMES DAILY WITH A MEAL. 60 tablet 11  . digoxin (LANOXIN) 0.125 MG tablet TAKE ONE TABLET BY MOUTH ONE TIME DAILY. 90 tablet 1  . furosemide (LASIX) 20 MG tablet TAKE 1 TABLET (20 MG TOTAL) BY MOUTH DAILY. 90 tablet 1  . levothyroxine (SYNTHROID, LEVOTHROID) 88 MCG tablet Take 1 tablet (88 mcg total) by mouth daily before breakfast. 30 tablet 0  . NOVOFINE 32G X 6 MM MISC TEST THREE TIMES  DAILY AS DIRECTED 100 each 2  . ONE TOUCH ULTRA TEST test strip USE AS DIRECTED TWICE DAILY. 100 each 3  . ONETOUCH DELICA LANCETS FINE MISC TEST TWICE DAILY AS DIRECTED. 100 each 2  . rosuvastatin (CRESTOR) 40 MG tablet Take one tablet daily (Patient not taking: Reported on 03/21/2015) 90 tablet 2  . [DISCONTINUED] glimepiride (AMARYL) 4 MG tablet 1.5 tablet a day 30 tablet 11   No current facility-administered medications on file prior to visit.     No Known Allergies  Family History  Problem Relation Age of Onset  . Adopted: Yes  . Family history unknown: Yes    BP 126/72   Pulse 78   Ht 6\' 1"  (1.854 m)   Wt 200 lb (90.7 kg)   BMI 26.39 kg/m    Review of Systems He denies hypoglycemia.      Objective:   Physical Exam VITAL SIGNS:  See vs page GENERAL: no distress Pulses: dorsalis pedis intact bilat.   MSK: no deformity of the feet CV: no leg edema Skin:  no ulcer on the feet.  normal color and temp on the feet.  Old healed surgical scar (vein harvest) at the right leg.   Neuro: sensation is intact to touch on the feet  Lab Results  Component Value Date   HGBA1C 7.0 09/15/2015      Assessment & Plan:  Type 1 DM: well-controlled

## 2015-09-29 ENCOUNTER — Telehealth: Payer: Self-pay | Admitting: Endocrinology

## 2015-09-29 MED ORDER — BAYER CONTOUR NEXT EZ W/DEVICE KIT: PACK | 2 refills | Status: DC

## 2015-09-29 MED ORDER — GLUCOSE BLOOD VI STRP: ORAL_STRIP | 12 refills | Status: DC

## 2015-09-29 MED ORDER — BAYER MICROLET LANCETS MISC: 12 refills | Status: DC

## 2015-09-29 NOTE — Telephone Encounter (Signed)
Please call in the diabetic supplies to [facility] [place] in [place], meter is the contour next one

## 2015-09-29 NOTE — Telephone Encounter (Signed)
Refill submitted. 

## 2015-10-18 ENCOUNTER — Ambulatory Visit
Admission: RE | Admit: 2015-10-18 | Discharge: 2015-10-18 | Disposition: A | Discharge status: Home / Self Care | Payer: BLUE CROSS/BLUE SHIELD | Source: Ambulatory Visit | Attending: Nurse Practitioner | Admitting: Nurse Practitioner

## 2015-10-18 ENCOUNTER — Other Ambulatory Visit: Payer: Self-pay | Admitting: Nurse Practitioner

## 2015-10-18 DIAGNOSIS — J40 Bronchitis, not specified as acute or chronic: Secondary | ICD-10-CM

## 2015-11-02 ENCOUNTER — Other Ambulatory Visit: Payer: Self-pay | Admitting: Internal Medicine

## 2015-11-16 ENCOUNTER — Encounter: Payer: Self-pay | Admitting: Nurse Practitioner

## 2015-11-29 NOTE — Progress Notes (Signed)
Electrophysiology Office Note Date: 11/30/2015  ID:  Caleb Bell, DOB [birth year], MRN [id number]  PCP: Caleb Shelling, MD  Cardiologist: Caleb Bell Electrophysiologist: Caleb Bell  CC: Routine ICD follow-up  Caleb Bell is a 60 y.o. male seen today for Dr Caleb Bell.  He presents today for routine electrophysiology followup.  Since last being seen in our clinic, the patient reports doing very well. He denies chest pain, palpitations, dyspnea, PND, orthopnea, nausea, vomiting, dizziness, syncope, edema, weight gain, or early satiety.  He has not had ICD shocks.   Device History: STJ CRTD implanted 2013 for ICM, CHF History of appropriate therapy: No History of AAD therapy: No   Past Medical History:  Diagnosis Date  . Atrial fibrillation (HCC)    Paroxysmal, limited  . CAD (coronary artery disease)    CABG 2003  . Chronic systolic heart failure (HCC)    EF 20-25% echo 5- 2011  . CRI (chronic renal insufficiency)     multifactorial... Dr. Posey Bell.. consult September 18, 2009  . DM type 2 (diabetes mellitus, type 2) (HCC)    Dr Caleb Bell  . Ejection fraction < 50%    EF 25%, echo, November, 2012  //   planning followup 2-D echo to assess LV function with CRT D.  device in place  . Elevated serum creatinine   . Hodgkin's disease     Dx aprox 2004, s/p XRT-Chemo  . Hyperkalemia    August, 2011, Aldactone, Aldactone stopped  . Hyperlipidemia    low HDL  . Hypothyroidism   . ICD (implantable cardiac defibrillator) in place    CRT-D placed March, 2013  . Ischemic cardiomyopathy    MRI12/12 no real viability in hypo/akinetic segment  CAth native and graft disease  . IVCD (intraventricular conduction defect)   . Personal history of colonic adenomas 12/24/2011  . Polysubstance    Alcohol, cocaine- resoloved for many years    Past Surgical History:  Procedure Laterality Date  . BI-VENTRICULAR IMPLANTABLE CARDIOVERTER DEFIBRILLATOR N/A 04/01/2011   Procedure: BI-VENTRICULAR  IMPLANTABLE CARDIOVERTER DEFIBRILLATOR  (CRT-D);  Surgeon: Deboraha Sprang, MD;  Location: [facility] CATH [facility];  Service: Cardiovascular;  Laterality: N/A;  . CARDIAC DEFIBRILLATOR PLACEMENT    . COLONOSCOPY  12/24/2011   Procedure: COLONOSCOPY;  Surgeon: Caleb Mayer, MD;  Location: [facility] ENDOSCOPY;  Service: Endoscopy;  Laterality: N/A;  . CORONARY ARTERY BYPASS GRAFT  2003  . CYSTECTOMY     Upper Back  . TONSILLECTOMY      Current Outpatient Prescriptions  Medication Sig Dispense Refill  . apixaban (ELIQUIS) 5 MG TABS tablet Take 1 tablet (5 mg total) by mouth 2 (two) times daily. 180 tablet 0  . atorvastatin (LIPITOR) 80 MG tablet Take 80 mg by mouth daily.  6  . BAYER MICROLET LANCETS lancets Use to check blood sugar 2 times per day. 100 each 12  . benazepril (LOTENSIN) 20 MG tablet Take 0.5 tablets (10 mg total) by mouth daily. 90 tablet 2  . Blood Glucose Monitoring Suppl (CONTOUR NEXT EZ MONITOR) w/Device KIT Use to check blood sugar 2 times per day. 1 kit 2  . carvedilol (COREG) 25 MG tablet TAKE 1 TABLET (25 MG TOTAL) BY MOUTH 2 (TWO) TIMES DAILY WITH A MEAL. 60 tablet 11  . digoxin (LANOXIN) 0.125 MG tablet TAKE ONE TABLET BY MOUTH ONE TIME DAILY. 90 tablet 1  . furosemide (LASIX) 20 MG tablet TAKE 1 TABLET (20 MG TOTAL) BY MOUTH DAILY. 90 tablet 0  .  glucose blood (BAYER CONTOUR NEXT TEST) test strip Use to check blood sugar 2 times per day. 100 each 12  . insulin aspart (NOVOLOG FLEXPEN) 100 UNIT/ML FlexPen INJECT 3 TIMES A DAY (JUST BEFORE EACH MEAL) 11-09-28 UNITS. 30 pen 1  . levothyroxine (SYNTHROID, LEVOTHROID) 88 MCG tablet Take 1 tablet (88 mcg total) by mouth daily before breakfast. 30 tablet 0  . NOVOFINE 32G X 6 MM MISC TEST THREE TIMES DAILY AS DIRECTED 100 each 2  . ONETOUCH DELICA LANCETS FINE MISC TEST TWICE DAILY AS DIRECTED. 100 each 2   No current facility-administered medications for this visit.     Allergies:   Patient has no known allergies.   Social  History: Social History   Social History  . Marital status: Single    Spouse name: N/A  . Number of children: 0  . Years of education: N/A   Occupational History  . musician    Social History Main Topics  . Smoking status: Never Smoker  . Smokeless tobacco: Never Used  . Alcohol use No     Comment: Hx of abuse/clean since 2003   . Drug use: No     Comment: Hx of abuse/clean for many years  . Sexual activity: Not on file   Other Topics Concern  . Not on file   Social History Narrative   Musician   Single, no children, lives by himself   Alcohol use-no (hx of abuse clean since 2003)      Drug use-no (hx of abuse clean x years)      Family History: Family History  Problem Relation Age of Onset  . Adopted: Yes  . Family history unknown: Yes    Review of Systems: All other systems reviewed and are otherwise negative except as noted above.   Physical Exam: VS:  BP 140/86   Pulse 69   Ht 6' 1"  (1.854 m)   Wt 203 lb (92.1 kg)   BMI 26.78 kg/m  , BMI Body mass index is 26.78 kg/m.  GEN- The patient is well appearing, alert and oriented x 3 today.   HEENT: normocephalic, atraumatic; sclera clear, conjunctiva pink; hearing intact; oropharynx clear; neck supple  Lungs- Clear to ausculation bilaterally, normal work of breathing.  No wheezes, rales, rhonchi Heart- Regular rate and rhythm (paced) GI- soft, non-tender, non-distended, bowel sounds present  Extremities- no clubbing, cyanosis, or edema  MS- no significant deformity or atrophy Skin- warm and dry, no rash or lesion; ICD pocket well healed Psych- euthymic mood, full affect Neuro- strength and sensation are intact  ICD interrogation- reviewed in detail today,  See PACEART report  EKG:  EKG is ordered today. The ekg ordered today shows sinus rhythm, ventricular pacing, PVC  Recent Labs: No results found for requested labs within last 8760 hours.   Wt Readings from Last 3 Encounters:  11/30/15 203 lb  (92.1 kg)  09/15/15 200 lb (90.7 kg)  03/21/15 192 lb (87.1 kg)     Other studies Reviewed: Additional studies/ records that were reviewed today include: Dr Olin Pia office notes  Assessment and Plan:  1.  Chronic systolic dysfunction euvolemic today Stable on an appropriate medical regimen Normal ICD function See Pace Art report No changes today Dig level today. BMET recently checked by renal  2.  Paroxysmal atrial fibrillation Burden by device interrogation <1% V rates controlled Continue Eliquis for CHADS2VASC of at least 3 CBC today  3.  CAD s/p CABG No recent ischemic symptoms Continue  medical therapy  4.  Carotid stenosis Overdue for repeat carotid ultrasound, will order today    Current medicines are reviewed at length with the patient today.   The patient does not have concerns regarding his medicines.  The following changes were made today:  none  Labs/ tests ordered today include: Dig level, CBC, carotid ultrasound Orders Placed This Encounter  Procedures  . Brain natriuretic peptide  . CBC  . Digoxin level  . EKG 12-Lead     Disposition:   Follow up with Delilah Shan, Dr Caleb Bell 1 year, Dr Caleb Bell as scheduled    Signed, Chanetta Marshall, NP 11/30/2015 10:03 AM  [facility] [address] [place] [address] [phone] (office) [phone] (fax

## 2015-11-30 ENCOUNTER — Ambulatory Visit (INDEPENDENT_AMBULATORY_CARE_PROVIDER_SITE_OTHER): Payer: BLUE CROSS/BLUE SHIELD | Admitting: Nurse Practitioner

## 2015-11-30 ENCOUNTER — Encounter: Payer: Self-pay | Admitting: Nurse Practitioner

## 2015-11-30 VITALS — BP 140/86 | HR 69 | Ht 73.0 in | Wt 203.0 lb

## 2015-11-30 DIAGNOSIS — I480 Paroxysmal atrial fibrillation: Secondary | ICD-10-CM

## 2015-11-30 DIAGNOSIS — I6529 Occlusion and stenosis of unspecified carotid artery: Secondary | ICD-10-CM

## 2015-11-30 DIAGNOSIS — I25810 Atherosclerosis of coronary artery bypass graft(s) without angina pectoris: Secondary | ICD-10-CM | POA: Diagnosis not present

## 2015-11-30 DIAGNOSIS — I5022 Chronic systolic (congestive) heart failure: Secondary | ICD-10-CM

## 2015-11-30 LAB — CUP PACEART INCLINIC DEVICE CHECK
Date Time Interrogation Session: 20171109100706
Implantable Lead Implant Date: 20130311
Implantable Lead Implant Date: 20130311
Implantable Lead Implant Date: 20130311
Implantable Lead Location: 753858
Implantable Lead Location: 753859
Implantable Lead Location: 753860
Implantable Lead Model: 7122
Implantable Pulse Generator Implant Date: 20130311
Pulse Gen Serial Number: 1042049

## 2015-11-30 LAB — CBC
HCT: 37 % — ABNORMAL LOW (ref 38.5–50.0)
Hemoglobin: 13.1 g/dL — ABNORMAL LOW (ref 13.2–17.1)
MCH: 29.2 pg (ref 27.0–33.0)
MCHC: 35.4 g/dL (ref 32.0–36.0)
MCV: 82.6 fL (ref 80.0–100.0)
MPV: 9.6 fL (ref 7.5–12.5)
Platelets: 186 10*3/uL (ref 140–400)
RBC: 4.48 MIL/uL (ref 4.20–5.80)
RDW: 14.9 % (ref 11.0–15.0)
WBC: 7 10*3/uL (ref 3.8–10.8)

## 2015-11-30 LAB — BRAIN NATRIURETIC PEPTIDE: Brain Natriuretic Peptide: 76.7 pg/mL (ref <100)

## 2015-11-30 NOTE — Patient Instructions (Addendum)
Medication Instructions:    Your physician recommends that you continue on your current medications as directed. Please refer to the Current Medication list given to you today.    If you need a refill on your cardiac medications before your next appointment, please call your pharmacy.  Labwork: BNP DIG LEVEL AND CBC   Testing/Procedures:  Your physician has requested that you have a carotid duplex. This test is an ultrasound of the carotid arteries in your neck. It looks at blood flow through these arteries that supply the brain with blood. Allow one hour for this exam. There are no restrictions or special instructions.     Follow-Up: Your physician wants you to follow-up in: [place] will receive a reminder letter in the mail two months in advance. If you don't receive a letter, please call our office to schedule the follow-up appointment.  Remote monitoring is used to monitor your Pacemaker of ICD from home. This monitoring reduces the number of office visits required to check your device to one time per year. It allows [place] to keep an eye on the functioning of your device to ensure it is working properly. 8..You are scheduled for a device check from home on .Marland KitchenMarland Kitchen2/8/18..You may send your transmission at any time that day. If you have a wireless device, the transmission will be sent automatically. After your physician reviews your transmission, you will receive a postcard with your next transmission date.     Any Other Special Instructions Will Be Listed Below (If Applicable).

## 2015-12-01 LAB — DIGOXIN LEVEL: Digoxin Level: 0.6 ug/L — ABNORMAL LOW (ref 0.8–2.0)

## 2015-12-06 ENCOUNTER — Telehealth: Payer: Self-pay | Admitting: *Deleted

## 2015-12-06 NOTE — Telephone Encounter (Signed)
SPOKE TO PT ABOUT RESULTS AND VERBALIZED UNDERSTANDING  

## 2015-12-06 NOTE — Telephone Encounter (Signed)
-----   Message from Patsey Berthold, NP sent at 12/01/2015 10:05 AM EST ----- Please notify patient of stable labs. Thanks!

## 2015-12-09 ENCOUNTER — Encounter: Payer: Self-pay | Admitting: Nurse Practitioner

## 2015-12-28 ENCOUNTER — Ambulatory Visit (HOSPITAL_COMMUNITY)
Admission: RE | Admit: 2015-12-28 | Discharge: 2015-12-28 | Disposition: A | Discharge status: Home / Self Care | Payer: BLUE CROSS/BLUE SHIELD | Source: Ambulatory Visit | Attending: Cardiology | Admitting: Cardiology

## 2015-12-28 DIAGNOSIS — E119 Type 2 diabetes mellitus without complications: Secondary | ICD-10-CM | POA: Diagnosis not present

## 2015-12-28 DIAGNOSIS — I6529 Occlusion and stenosis of unspecified carotid artery: Secondary | ICD-10-CM

## 2015-12-28 DIAGNOSIS — E785 Hyperlipidemia, unspecified: Secondary | ICD-10-CM | POA: Diagnosis not present

## 2015-12-28 DIAGNOSIS — Z951 Presence of aortocoronary bypass graft: Secondary | ICD-10-CM | POA: Diagnosis not present

## 2015-12-28 DIAGNOSIS — I6523 Occlusion and stenosis of bilateral carotid arteries: Secondary | ICD-10-CM | POA: Insufficient documentation

## 2015-12-28 DIAGNOSIS — I2510 Atherosclerotic heart disease of native coronary artery without angina pectoris: Secondary | ICD-10-CM | POA: Insufficient documentation

## 2016-01-03 IMAGING — CR DG CERVICAL SPINE COMPLETE 4+V
6 series · 6 of 6 positions shown · non-contrast
Comparison: None.

CLINICAL DATA: Chronic neck pain and decreased range of motion.

EXAM:
CERVICAL SPINE  4+ VIEWS

[view not recorded (1 of 6)]
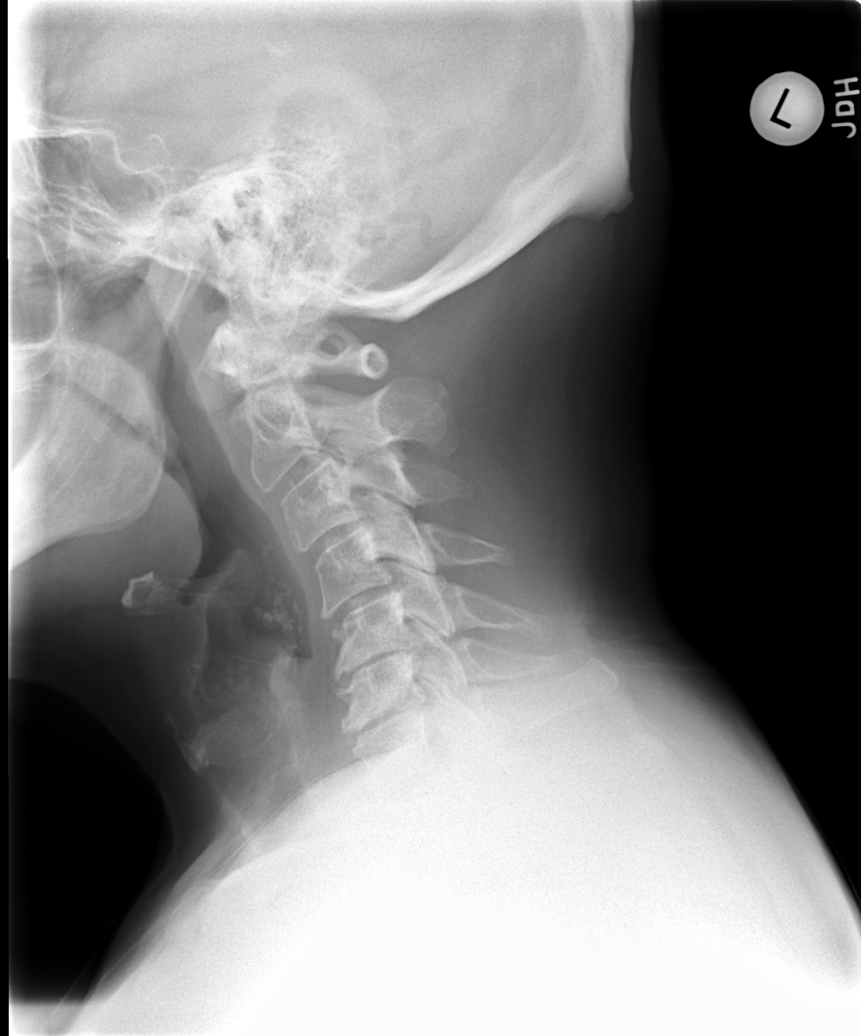

[view not recorded (2 of 6)]
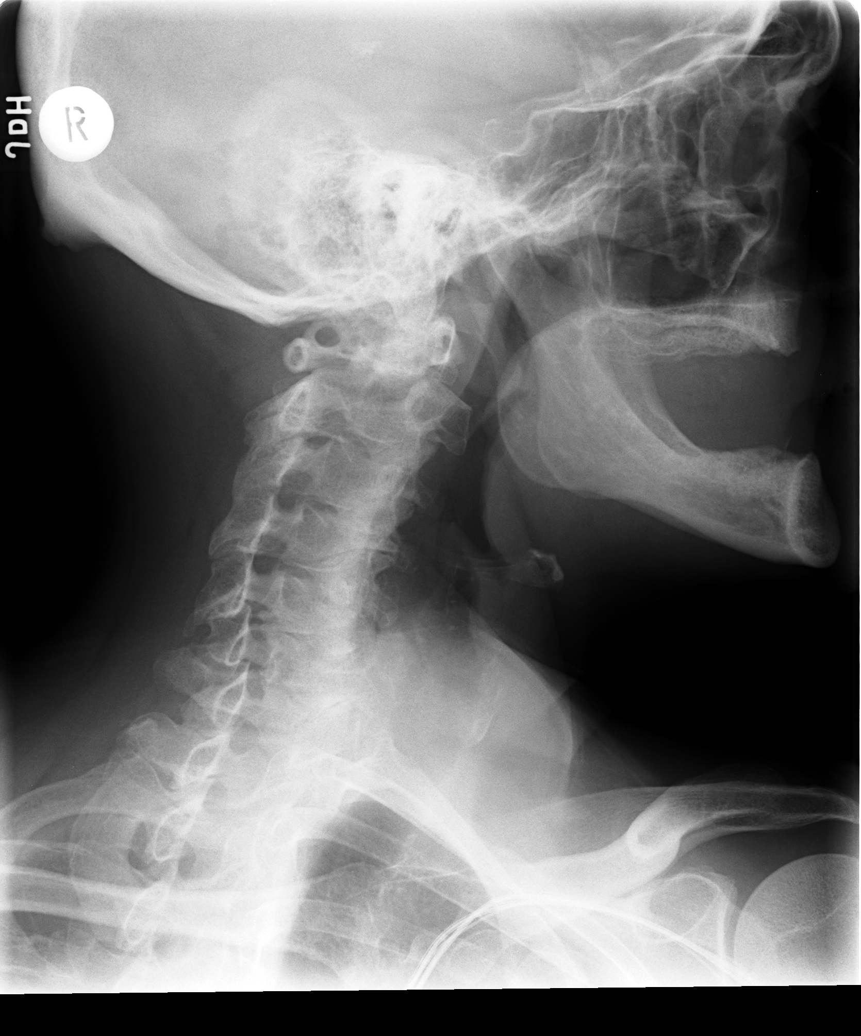

[view not recorded (3 of 6)]
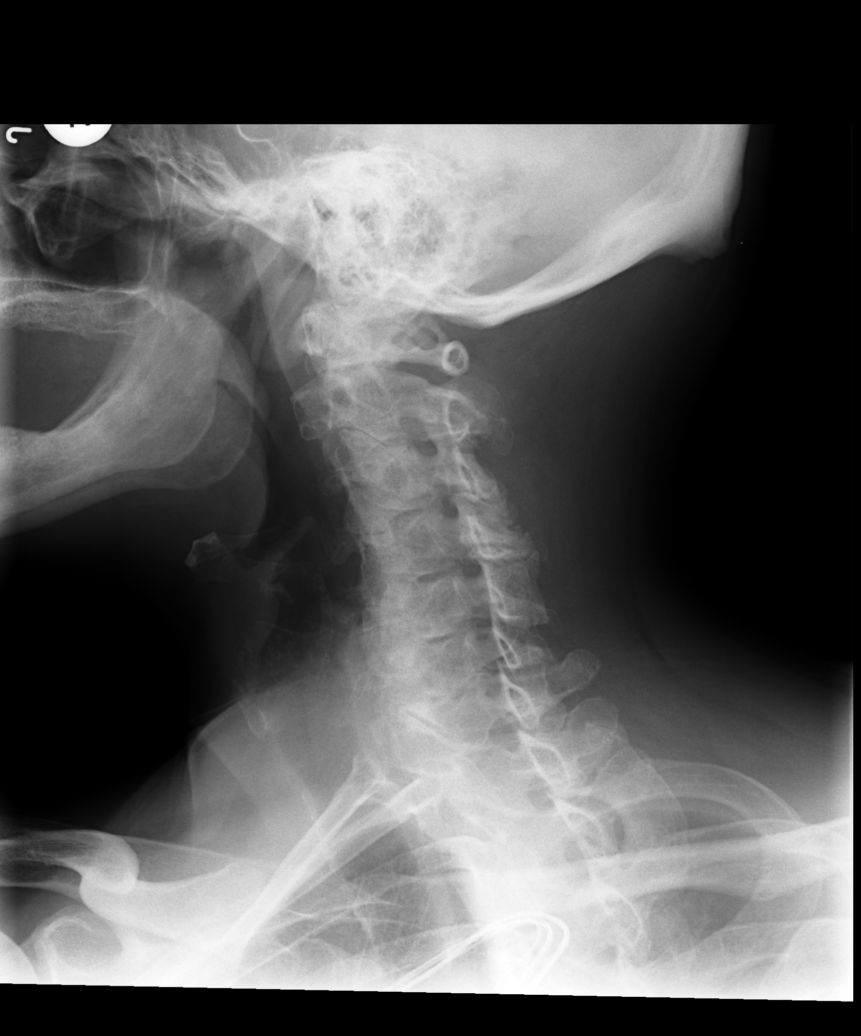

[view not recorded (4 of 6)]
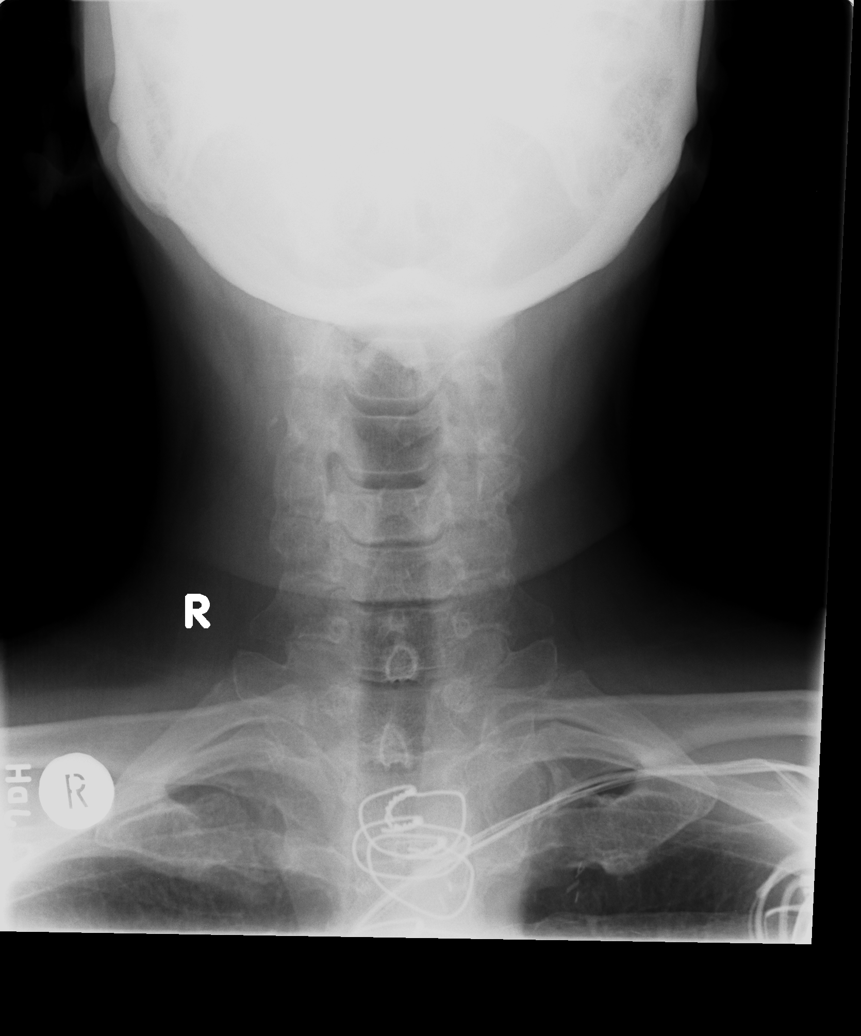

[view not recorded (5 of 6)]
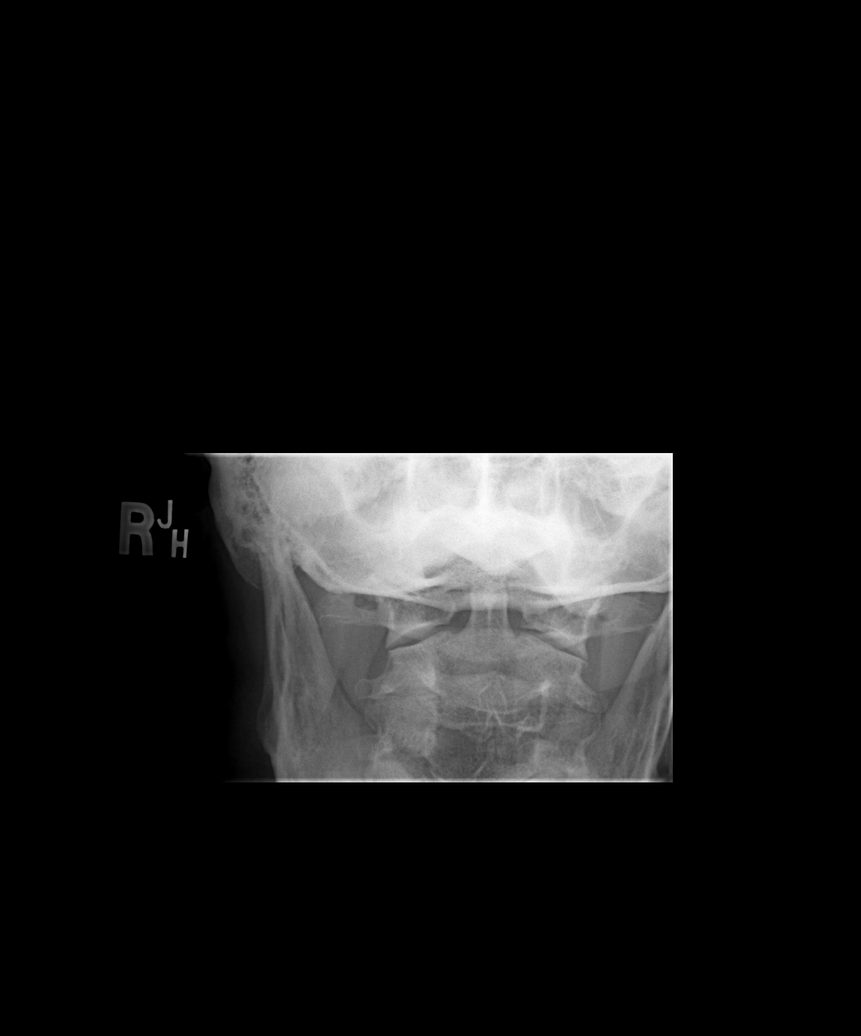

[view not recorded (6 of 6)]
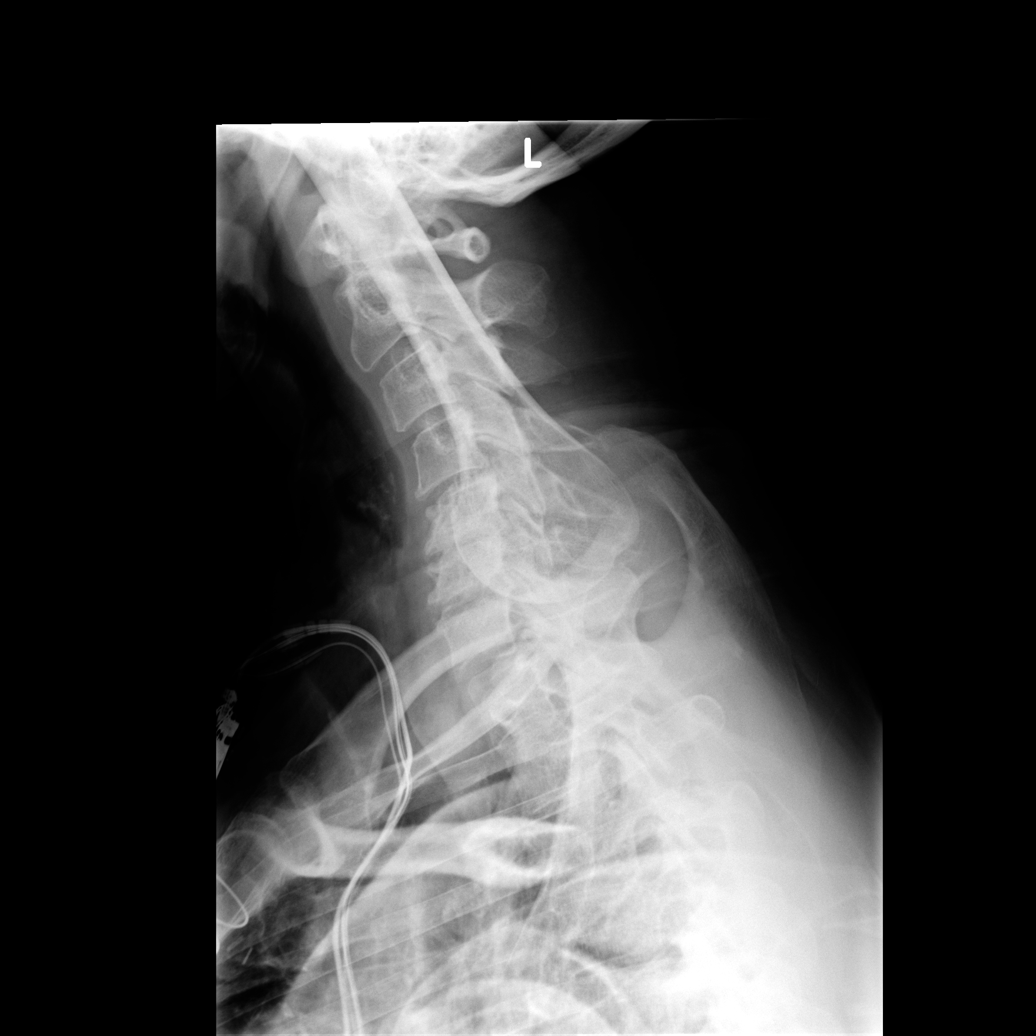

[6 of 6 positions shown; findings below may reference images not displayed]

FINDINGS: There is mild reversal of the normal cervical lordosis. Vertebral
body heights are within normal. There is mild to moderate
spondylosis throughout the cervical spine most notable over the
lower cervical spine. Disc space narrowing is present at the C5-6
and C6-7 levels. Prevertebral soft tissues are within normal. There
is uncovertebral joint spurring and facet arthropathy. The
atlantoaxial articulation is within normal. There is mild left-sided
neural foraminal narrowing at the C3-4, C4-5 and C6-7 levels there
is right-sided neural foraminal narrowing at the C5-6 and C6-7
levels.
IMPRESSION: Moderate spondylosis of the cervical spine with disc disease at the
C5-6 and C6-7 levels. Neural foraminal narrowing bilaterally as
described.

## 2016-01-11 ENCOUNTER — Other Ambulatory Visit: Payer: Self-pay | Admitting: Internal Medicine

## 2016-02-19 ENCOUNTER — Other Ambulatory Visit: Payer: Self-pay | Admitting: Internal Medicine

## 2016-02-20 NOTE — Telephone Encounter (Signed)
Medication Detail    Disp Refills Start End   furosemide (LASIX) 20 MG tablet 90 tablet 3 01/11/2016    Sig: TAKE 1 TABLET (20 MG TOTAL) BY MOUTH DAILY.   E-Prescribing Status: Receipt confirmed by pharmacy (01/11/2016 9:49 AM EST)   Pharmacy   [facility] [place], [address]

## 2016-02-21 ENCOUNTER — Other Ambulatory Visit: Payer: Self-pay | Admitting: *Deleted

## 2016-02-21 MED ORDER — FUROSEMIDE 20 MG PO TABS: ORAL_TABLET | ORAL | 3 refills | Status: DC

## 2016-02-24 ENCOUNTER — Other Ambulatory Visit: Payer: Self-pay | Admitting: Internal Medicine

## 2016-02-29 ENCOUNTER — Ambulatory Visit (INDEPENDENT_AMBULATORY_CARE_PROVIDER_SITE_OTHER): Payer: BLUE CROSS/BLUE SHIELD | Admitting: *Deleted

## 2016-02-29 ENCOUNTER — Telehealth: Payer: Self-pay | Admitting: Cardiology

## 2016-02-29 DIAGNOSIS — I255 Ischemic cardiomyopathy: Secondary | ICD-10-CM | POA: Diagnosis not present

## 2016-02-29 NOTE — Telephone Encounter (Signed)
Spoke with pt and reminded pt of remote transmission that is due today. Pt verbalized understanding.   

## 2016-03-01 ENCOUNTER — Encounter: Payer: Self-pay | Admitting: Cardiology

## 2016-03-01 NOTE — Progress Notes (Signed)
Remote ICD transmission.   

## 2016-03-05 LAB — CUP PACEART REMOTE DEVICE CHECK
Battery Remaining Longevity: 13 mo
Battery Remaining Percentage: 18 %
Battery Voltage: 2.75 V
Brady Statistic AP VP Percent: 83 %
Brady Statistic AP VS Percent: 1.6 %
Brady Statistic AS VP Percent: 12 %
Brady Statistic AS VS Percent: 1 %
Brady Statistic RA Percent Paced: 82 %
Date Time Interrogation Session: 20180208172129
HighPow Impedance: 71 Ohm
HighPow Impedance: 71 Ohm
Implantable Lead Implant Date: 20130311
Implantable Lead Implant Date: 20130311
Implantable Lead Implant Date: 20130311
Implantable Lead Location: 753858
Implantable Lead Location: 753859
Implantable Lead Location: 753860
Implantable Lead Model: 7122
Implantable Pulse Generator Implant Date: 20130311
Lead Channel Impedance Value: 400 Ohm
Lead Channel Impedance Value: 430 Ohm
Lead Channel Impedance Value: 490 Ohm
Lead Channel Pacing Threshold Amplitude: 0.625 V
Lead Channel Pacing Threshold Amplitude: 1.125 V
Lead Channel Pacing Threshold Amplitude: 1.5 V
Lead Channel Pacing Threshold Pulse Width: 0.5 ms
Lead Channel Pacing Threshold Pulse Width: 0.5 ms
Lead Channel Pacing Threshold Pulse Width: 0.8 ms
Lead Channel Sensing Intrinsic Amplitude: 12 mV
Lead Channel Sensing Intrinsic Amplitude: 4 mV
Lead Channel Setting Pacing Amplitude: 2 V
Lead Channel Setting Pacing Amplitude: 2.125
Lead Channel Setting Pacing Amplitude: 2.5 V
Lead Channel Setting Pacing Pulse Width: 0.5 ms
Lead Channel Setting Pacing Pulse Width: 0.8 ms
Lead Channel Setting Sensing Sensitivity: 0.5 mV
Pulse Gen Serial Number: 1042049

## 2016-03-07 ENCOUNTER — Encounter: Payer: Self-pay | Admitting: Internal Medicine

## 2016-03-07 ENCOUNTER — Telehealth: Payer: Self-pay | Admitting: Internal Medicine

## 2016-03-07 ENCOUNTER — Ambulatory Visit (INDEPENDENT_AMBULATORY_CARE_PROVIDER_SITE_OTHER): Payer: BLUE CROSS/BLUE SHIELD | Admitting: Internal Medicine

## 2016-03-07 ENCOUNTER — Other Ambulatory Visit: Payer: Self-pay | Admitting: Endocrinology

## 2016-03-07 VITALS — BP 106/52 | HR 61 | Ht 73.0 in | Wt 204.8 lb

## 2016-03-07 DIAGNOSIS — I2510 Atherosclerotic heart disease of native coronary artery without angina pectoris: Secondary | ICD-10-CM | POA: Diagnosis not present

## 2016-03-07 DIAGNOSIS — E785 Hyperlipidemia, unspecified: Secondary | ICD-10-CM

## 2016-03-07 DIAGNOSIS — I5022 Chronic systolic (congestive) heart failure: Secondary | ICD-10-CM | POA: Diagnosis not present

## 2016-03-07 NOTE — Progress Notes (Signed)
Cardiology Office Note   Date:  03/07/2016   ID:  BLUFORD Bell, DOB 18-Feb-1955, MRN [id number]  PCP:  Irven Shelling, MD  Cardiologist:   Dorris Carnes, MD   F?U of CAD and CHF      History of Present Illness: Caleb Bell is a 61 y.o. male with a history ofofschemic cardiomyopathy, s/p CABG 2003 congestive heart failure and an IVCD with a broad QRS status post CRT-D implantation (March 2013). He is followed by Caleb Bell  Was followed by Caleb Bell   He also has a history of PAF, renal insuff, Hodkins dz (s/p chemo and XRT)   Echocardiogram 12/15 EF 20-25% Carotid Dopplers 12/15 bilateral 40-59% stenosis  SInce I saw him last year he has done well  Breathing is good  No CP  No dizziness       Current Meds  Medication Sig  . apixaban (ELIQUIS) 5 MG TABS tablet Take 1 tablet (5 mg total) by mouth 2 (two) times daily.  Marland Kitchen atorvastatin (LIPITOR) 80 MG tablet Take 80 mg by mouth daily.  Marland Kitchen BAYER MICROLET LANCETS lancets Use to check blood sugar 2 times per day.  . benazepril (LOTENSIN) 20 MG tablet Take 0.5 tablets (10 mg total) by mouth daily.  . Blood Glucose Monitoring Suppl (CONTOUR NEXT EZ MONITOR) w/Device KIT Use to check blood sugar 2 times per day.  . carvedilol (COREG) 25 MG tablet Take 1 tablet by mouth twice daily with a meal.*Pt is overdue for an appt with Dr Harrington Challenger. Please call and schedule for further refills*  . digoxin (LANOXIN) 0.125 MG tablet TAKE ONE TABLET BY MOUTH ONE TIME DAILY.  . furosemide (LASIX) 20 MG tablet TAKE 1 TABLET (20 MG TOTAL) BY MOUTH DAILY.  Marland Kitchen glucose blood (BAYER CONTOUR NEXT TEST) test strip Use to check blood sugar 2 times per day.  . insulin aspart (NOVOLOG FLEXPEN) 100 UNIT/ML FlexPen INJECT 3 TIMES A DAY (JUST BEFORE EACH MEAL) 11-09-28 UNITS.  Marland Kitchen levothyroxine (SYNTHROID, LEVOTHROID) 88 MCG tablet Take 1 tablet (88 mcg total) by mouth daily before breakfast.  . NOVOFINE 32G X 6 MM MISC TEST THREE TIMES DAILY AS DIRECTED  . ONETOUCH  DELICA LANCETS FINE MISC TEST TWICE DAILY AS DIRECTED.     Allergies:   Patient has no known allergies.   Past Medical History:  Diagnosis Date  . Atrial fibrillation (HCC)    Paroxysmal, limited  . CAD (coronary artery disease)    CABG 2003  . Chronic systolic heart failure (HCC)    EF 20-25% echo 5- 2011  . CRI (chronic renal insufficiency)     multifactorial... Dr. Posey Pronto.. consult September 18, 2009  . DM type 2 (diabetes mellitus, type 2) (HCC)    Dr Loanne Drilling  . Ejection fraction < 50%    EF 25%, echo, November, 2012  //   planning followup 2-D echo to assess LV function with CRT D.  device in place  . Elevated serum creatinine   . Hodgkin's disease     Dx aprox 2004, s/p XRT-Chemo  . Hyperkalemia    August, 2011, Aldactone, Aldactone stopped  . Hyperlipidemia    low HDL  . Hypothyroidism   . ICD (implantable cardiac defibrillator) in place    CRT-D placed March, 2013  . Ischemic cardiomyopathy    MRI12/12 no real viability in hypo/akinetic segment  CAth native and graft disease  . IVCD (intraventricular conduction defect)   . Personal history of colonic adenomas 12/24/2011  .  Polysubstance    Alcohol, cocaine- resoloved for many years     Past Surgical History:  Procedure Laterality Date  . BI-VENTRICULAR IMPLANTABLE CARDIOVERTER DEFIBRILLATOR N/A 04/01/2011   Procedure: BI-VENTRICULAR IMPLANTABLE CARDIOVERTER DEFIBRILLATOR  (CRT-D);  Surgeon: Deboraha Sprang, MD;  Location: [facility] CATH LAB;  Service: Cardiovascular;  Laterality: N/A;  . CARDIAC DEFIBRILLATOR PLACEMENT    . COLONOSCOPY  12/24/2011   Procedure: COLONOSCOPY;  Surgeon: [name], MD;  Location: [facility] ENDOSCOPY;  Service: Endoscopy;  Laterality: N/A;  . CORONARY ARTERY BYPASS GRAFT  2003  . CYSTECTOMY     Upper Back  . TONSILLECTOMY       Social History:  The patient  reports that he has never smoked. He has never used smokeless tobacco. He reports that he does not drink alcohol or use drugs.   Family  History:  The patient's He was adopted. Family history is unknown by patient.    ROS:  Please see the history of present illness. All other systems are reviewed and  Negative to the above problem except as noted.    PHYSICAL EXAM: VS:  BP (!) 106/52   Pulse 61   Ht 6' 1"  (1.854 m)   Wt 204 lb 12.8 oz (92.9 kg)   SpO2 97%   BMI 27.02 kg/m   GEN: Well nourished, well developed, in no acute distress  HEENT: normal  Neck: no JVD, carotid bruits, or masses Cardiac: RRR; no murmurs, rubs, or gallops,no edema  Respiratory:  clear to auscultation bilaterally, normal work of breathing GI: soft, nontender, nondistended, + BS  No hepatomegaly  MS: no deformity Moving all extremities   Skin: warm and dry, no rash Neuro:  Strength and sensation are intact Psych: euthymic mood, full affect   EKG:  EKG is not  ordered today.   Lipid Panel    Component Value Date/Time   CHOL 105 11/15/2013 0911   TRIG 169.0 (H) 11/15/2013 0911   TRIG 306 (HH) 11/06/2005 1313   HDL 28.80 (L) 11/15/2013 0911   CHOLHDL 4 11/15/2013 0911   VLDL 33.8 11/15/2013 0911   LDLCALC 42 11/15/2013 0911   LDLDIRECT 66.3 11/11/2012 0932      Wt Readings from Last 3 Encounters:  03/07/16 204 lb 12.8 oz (92.9 kg)  11/30/15 203 lb (92.1 kg)  09/15/15 200 lb (90.7 kg)      ASSESSMENT AND PLAN: 1  CAD  No symptoms to sugg angina   Keep on same meds   2  Chronic systolic CHF  Volume status looks good  I would reocmm switching to Entresto  Pt is going to check with insurance  Will check albs today  3  HL  Continue statin  Check lipids   F/U based on if Entresto started      Current medicines are reviewed at length with the patient today.  The patient does not have concerns regarding medicines.  Signed, Dorris Carnes, MD  03/07/2016 10:23 AM    [address], [place], [place]  [address] Phone: [phone]; Fax: [phone]

## 2016-03-07 NOTE — Patient Instructions (Signed)
Call your insurance company and find out the cost of [place].  Your starting dose would be Entresto 24/26 mg one pill 2 times a day. Then call or send a MyChart message to Dr. Harrington Challenger with update.

## 2016-03-08 ENCOUNTER — Encounter: Payer: Self-pay | Admitting: Internal Medicine

## 2016-03-08 NOTE — Telephone Encounter (Signed)
Left detailed voice mail (DPR on file) that 1.) Dr Harrington Challenger needs to review with the kidney doctor before patient starts Entresto 2.) Dr. Harrington Challenger reviewed labs from PCP and his LDL needs to be lower.  It was 84 on 03/13/15.  Goal would be 70 or less.  Add Zetia 10 mg and have follow up lipids in 8 weeks..  Asked patient to call back to discuss further.

## 2016-03-12 NOTE — Telephone Encounter (Signed)
Email response from patient 03/08/16:   I'm sorry I missed your call. Is the lipid situation something I can address with my diet?  Will call patient.  Waiting to give him update on [facility]

## 2016-03-13 NOTE — Telephone Encounter (Signed)
He can try diet.   Need to recheck in 4 months to see if adequate

## 2016-03-14 ENCOUNTER — Encounter: Payer: Self-pay | Admitting: Internal Medicine

## 2016-03-14 NOTE — Telephone Encounter (Signed)
Called patient and discussed dietary changes to help improved LDL.  Educated on foods high in saturated fats.  Pt verbalizes understanding and has scheduled lab appointment for June to recheck cholesterol.  He continues benazepril and is aware that Dr. Harrington Challenger wants to discuss with renal before prescribing Entresto.  Pt is appreciative for the information provided.

## 2016-03-18 ENCOUNTER — Encounter: Payer: Self-pay | Admitting: Endocrinology

## 2016-03-18 ENCOUNTER — Ambulatory Visit (INDEPENDENT_AMBULATORY_CARE_PROVIDER_SITE_OTHER): Payer: BLUE CROSS/BLUE SHIELD | Admitting: Endocrinology

## 2016-03-18 VITALS — BP 112/72 | HR 98 | Ht 73.0 in | Wt 203.0 lb

## 2016-03-18 DIAGNOSIS — E1122 Type 2 diabetes mellitus with diabetic chronic kidney disease: Secondary | ICD-10-CM

## 2016-03-18 DIAGNOSIS — N183 Chronic kidney disease, stage 3 unspecified: Secondary | ICD-10-CM

## 2016-03-18 DIAGNOSIS — Z794 Long term (current) use of insulin: Secondary | ICD-10-CM

## 2016-03-18 LAB — POCT GLYCOSYLATED HEMOGLOBIN (HGB A1C): Hemoglobin A1C: 7.2

## 2016-03-18 MED ORDER — INSULIN ASPART 100 UNIT/ML FLEXPEN: PEN_INJECTOR | SUBCUTANEOUS | 1 refills | Status: DC

## 2016-03-18 NOTE — Patient Instructions (Addendum)
Please increase the novolog to 3 times a day (just before each meal), 01-10-31 units check your blood sugar twice a day.  vary the time of day when you check, between before the 3 meals, and at bedtime.  also check if you have symptoms of your blood sugar being too high or too low.  please keep a record of the readings and bring it to your next appointment here.  You can write it on any piece of paper.  please call us sooner if your blood sugar goes below 70, or if you have a lot of readings over 200.   Please come back for a follow-up appointment in 6 months.

## 2016-03-18 NOTE — Progress Notes (Signed)
Subjective:    Patient ID: Caleb Bell, male    DOB: 1955-04-21, 61 y.o.   MRN: [id number]  HPI Pt returns for f/u of diabetes mellitus: DM type: 1 Dx'ed: 0623 Complications: polyneuropathy, renal insuff, foot ulcer, retinopathy, PAD, and CAD.   Therapy: insulin since 2010.   DKA: never.   Severe hypoglycemia: never.    Pancreatitis: never.   Other: he takes multiple daily injections; he declines pump therapy; the pattern of his cbg's indicates he does not need basal insulin (prob due to renal insufficiency).    Interval history: pt states he feels well in general. Meter is downloaded today, and the printout is scanned into the record.  It varies from 160-190.  There is no trend throughout the day.  Past Medical History:  Diagnosis Date  . Atrial fibrillation (HCC)    Paroxysmal, limited  . CAD (coronary artery disease)    CABG 2003  . Chronic systolic heart failure (HCC)    EF 20-25% echo 5- 2011  . CRI (chronic renal insufficiency)     multifactorial... Dr. Posey Pronto.. consult September 18, 2009  . DM type 2 (diabetes mellitus, type 2) (HCC)    Dr Loanne Drilling  . Ejection fraction < 50%    EF 25%, echo, November, 2012  //   planning followup 2-D echo to assess LV function with CRT D.  device in place  . Elevated serum creatinine   . Hodgkin's disease     Dx aprox 2004, s/p XRT-Chemo  . Hyperkalemia    August, 2011, Aldactone, Aldactone stopped  . Hyperlipidemia    low HDL  . Hypothyroidism   . ICD (implantable cardiac defibrillator) in place    CRT-D placed March, 2013  . Ischemic cardiomyopathy    MRI12/12 no real viability in hypo/akinetic segment  CAth native and graft disease  . IVCD (intraventricular conduction defect)   . Personal history of colonic adenomas 12/24/2011  . Polysubstance    Alcohol, cocaine- resoloved for many years     Past Surgical History:  Procedure Laterality Date  . BI-VENTRICULAR IMPLANTABLE CARDIOVERTER DEFIBRILLATOR N/A 04/01/2011   Procedure: BI-VENTRICULAR IMPLANTABLE CARDIOVERTER DEFIBRILLATOR  (CRT-D);  Surgeon: Deboraha Sprang, MD;  Location: [facility] CATH LAB;  Service: Cardiovascular;  Laterality: N/A;  . CARDIAC DEFIBRILLATOR PLACEMENT    . COLONOSCOPY  12/24/2011   Procedure: COLONOSCOPY;  Surgeon: [name], MD;  Location: [facility] ENDOSCOPY;  Service: Endoscopy;  Laterality: N/A;  . CORONARY ARTERY BYPASS GRAFT  2003  . CYSTECTOMY     Upper Back  . TONSILLECTOMY      Social History   Social History  . Marital status: Single    Spouse name: N/A  . Number of children: 0  . Years of education: N/A   Occupational History  . musician    Social History Main Topics  . Smoking status: Never Smoker  . Smokeless tobacco: Never Used  . Alcohol use No     Comment: Hx of abuse/clean since 2003   . Drug use: No     Comment: Hx of abuse/clean for many years  . Sexual activity: Not on file   Other Topics Concern  . 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 apixaban (ELIQUIS) 5 MG TABS tablet Take 1 tablet (5 mg total) by mouth 2 (two) times daily. 180 tablet 0  . atorvastatin (LIPITOR) 80 MG tablet Take 80 mg by mouth daily.  6  . BAYER MICROLET LANCETS lancets Use to check blood sugar 2 times per day. 100 each 12  . benazepril (LOTENSIN) 20 MG tablet Take 0.5 tablets (10 mg total) by mouth daily. 90 tablet 2  . Blood Glucose Monitoring Suppl (CONTOUR NEXT EZ MONITOR) w/Device KIT Use to check blood sugar 2 times per day. 1 kit 2  . carvedilol (COREG) 25 MG tablet Take 1 tablet by mouth twice daily with a meal.*Pt is overdue for an appt with Dr Harrington Challenger. Please call and schedule for further refills* 60 tablet 0  . digoxin (LANOXIN) 0.125 MG tablet TAKE ONE TABLET BY MOUTH ONE TIME  DAILY. 90 tablet 1  . furosemide (LASIX) 20 MG tablet TAKE 1 TABLET (20 MG TOTAL) BY MOUTH DAILY. 90 tablet 3  . glucose blood (BAYER CONTOUR NEXT TEST) test strip Use to check blood sugar 2 times per day. 100 each 12  . levothyroxine (SYNTHROID, LEVOTHROID) 88 MCG tablet Take 1 tablet (88 mcg total) by mouth daily before breakfast. 30 tablet 0  . NOVOFINE 32G X 6 MM MISC TEST THREE TIMES DAILY AS DIRECTED 100 each 2  . ONETOUCH DELICA LANCETS FINE MISC TEST TWICE DAILY AS DIRECTED. 100 each 2  . [DISCONTINUED] glimepiride (AMARYL) 4 MG tablet 1.5 tablet a day 30 tablet 11   No current facility-administered medications on file prior to visit.     No Known Allergies  Family History  Problem Relation Age of Onset  . Adopted: Yes  . Family history unknown: Yes    BP 112/72   Pulse 98   Ht 6' 1"  (1.854 m)   Wt 203 lb (92.1 kg)   SpO2 97%   BMI 26.78 kg/m    Review of Systems He says he had mild hypoglycemia in the afternoon, last week.  He says this was due to a smaller than expected lunch.      Objective:   Physical Exam VITAL SIGNS:  See vs page GENERAL: no distress Pulses: dorsalis pedis intact bilat.   MSK: no deformity of the feet CV: no leg edema Skin:  no ulcer on the feet.  normal color and temp on the feet.  Old healed surgical scar (vein harvest) at the right leg.   Neuro: sensation is intact to touch on the feet, but decreased from normal.   A1c=7.2% Lab Results  Component Value Date   CREATININE 1.68 (H) 09/22/2014   BUN 33 (H) 09/22/2014   NA 134 (L) 09/22/2014   K 4.9 09/22/2014   CL 99 09/22/2014   CO2 27 09/22/2014      Assessment & Plan:  Insulin-requiring type 2 DM, with PAD: worse.  Renal failure: in this setting, he does not need basal insulin.   Patient is advised the following: Patient Instructions  Please increase the novolog to 3 times a day (just before each meal), 01-10-31 units check your blood sugar twice a day.  vary the time of day  when you check, between before the 3 meals, and at bedtime.  also check if you have symptoms of your blood sugar being too high or too low.  please keep a record of the readings and bring it to your next appointment here.  You  can write it on any piece of paper.  please call us sooner if your blood sugar goes below 70, or if you have a lot of readings over 200.   Please come back for a follow-up appointment in 6 months.

## 2016-05-25 ENCOUNTER — Other Ambulatory Visit: Payer: Self-pay | Admitting: Internal Medicine

## 2016-06-20 ENCOUNTER — Other Ambulatory Visit: Payer: Self-pay | Admitting: Endocrinology

## 2016-07-12 ENCOUNTER — Other Ambulatory Visit: Payer: BLUE CROSS/BLUE SHIELD | Admitting: *Deleted

## 2016-07-12 DIAGNOSIS — E785 Hyperlipidemia, unspecified: Secondary | ICD-10-CM

## 2016-07-12 LAB — LIPID PANEL
Chol/HDL Ratio: 4.6 ratio (ref 0.0–5.0)
Cholesterol, Total: 146 mg/dL (ref 100–199)
HDL: 32 mg/dL — ABNORMAL LOW (ref >39)
LDL Calculated: 57 mg/dL (ref 0–99)
Triglycerides: 284 mg/dL — ABNORMAL HIGH (ref 0–149)
VLDL Cholesterol Cal: 57 mg/dL — ABNORMAL HIGH (ref 5–40)

## 2016-07-17 ENCOUNTER — Telehealth: Payer: Self-pay | Admitting: Internal Medicine

## 2016-07-17 NOTE — Telephone Encounter (Signed)
New Message  Pt call requesting to speak with RN about getting Lab results. Please call back to discuss

## 2016-07-17 NOTE — Telephone Encounter (Signed)
Informed patient that Dr. Harrington Challenger needs to review his lab work as of right now, and that her nurse will give him a call if there are any changes. Patient verbalized understanding.

## 2016-07-18 ENCOUNTER — Telehealth: Payer: Self-pay | Admitting: Cardiology

## 2016-07-18 NOTE — Telephone Encounter (Signed)
Spoke w/ pt and requested that he send a manual transmission b/c his home monitor has not updated in at least 7 days.   

## 2016-08-07 ENCOUNTER — Other Ambulatory Visit: Payer: Self-pay | Admitting: Endocrinology

## 2016-08-14 ENCOUNTER — Telehealth: Payer: Self-pay | Admitting: Internal Medicine

## 2016-08-14 NOTE — Telephone Encounter (Signed)
Mr.Weitzman is calling to see whether he can set up labs . Please call

## 2016-08-14 NOTE — Telephone Encounter (Signed)
Patient had lipids drawn on 07/12/16 which do not look reviewed by Dr. Harrington Challenger.  Patient has not received a call about results.  He was not fasting for this blood work.  He takes Lipitor 80 mg every evening.   I advised that I would send a message for Dr. Harrington Challenger to review and will call him when does to let him know if there are any recommendations for further blood work.  He wonders if triglycerides are falsely elevated because he had eaten.

## 2016-08-19 NOTE — Telephone Encounter (Signed)
Lipid panel from 6/22 did not come back to my in basket  I was not aware that it had been drawn LDL is excellent  Triglycerides are probably up because he ate I would keep on same meds

## 2016-08-21 NOTE — Telephone Encounter (Signed)
Spoke with patient and informed.  He is appreciative for the call/information provided.

## 2016-09-06 ENCOUNTER — Other Ambulatory Visit: Payer: Self-pay | Admitting: Endocrinology

## 2016-09-16 ENCOUNTER — Encounter: Payer: Self-pay | Admitting: Endocrinology

## 2016-09-16 ENCOUNTER — Ambulatory Visit (INDEPENDENT_AMBULATORY_CARE_PROVIDER_SITE_OTHER): Payer: BLUE CROSS/BLUE SHIELD | Admitting: Endocrinology

## 2016-09-16 VITALS — BP 112/74 | HR 77 | Wt 200.4 lb

## 2016-09-16 DIAGNOSIS — E1122 Type 2 diabetes mellitus with diabetic chronic kidney disease: Secondary | ICD-10-CM | POA: Diagnosis not present

## 2016-09-16 DIAGNOSIS — Z794 Long term (current) use of insulin: Secondary | ICD-10-CM | POA: Diagnosis not present

## 2016-09-16 DIAGNOSIS — N183 Chronic kidney disease, stage 3 unspecified: Secondary | ICD-10-CM

## 2016-09-16 LAB — POCT GLYCOSYLATED HEMOGLOBIN (HGB A1C): Hemoglobin A1C: 6.5

## 2016-09-16 NOTE — Progress Notes (Signed)
Subjective:    Patient ID: Caleb Bell, male    DOB: 1955/04/10, 61 y.o.   MRN: [id number]  HPI Pt returns for f/u of diabetes mellitus: DM type: 1 Dx'ed: 2694 Complications: polyneuropathy, renal insuff, foot ulcer, retinopathy, PAD, and CAD.   Therapy: insulin since 2010.   DKA: never.   Severe hypoglycemia: never.    Pancreatitis: never.   Other: he takes multiple daily injections; he declines pump therapy; the pattern of his cbg's indicates he does not need basal insulin (prob due to renal insufficiency).    Interval history: pt states he feels well in general. Meter is downloaded today, and the printout is scanned into the record.  It varies from 130-200's.  There is no trend throughout the day.  Past Medical History:  Diagnosis Date  . Atrial fibrillation (HCC)    Paroxysmal, limited  . CAD (coronary artery disease)    CABG 2003  . Chronic systolic heart failure (HCC)    EF 20-25% echo 5- 2011  . CRI (chronic renal insufficiency)     multifactorial... Dr. Posey Pronto.. consult September 18, 2009  . DM type 2 (diabetes mellitus, type 2) (HCC)    Dr Loanne Drilling  . Ejection fraction < 50%    EF 25%, echo, November, 2012  //   planning followup 2-D echo to assess LV function with CRT D.  device in place  . Elevated serum creatinine   . Hodgkin's disease     Dx aprox 2004, s/p XRT-Chemo  . Hyperkalemia    August, 2011, Aldactone, Aldactone stopped  . Hyperlipidemia    low HDL  . Hypothyroidism   . ICD (implantable cardiac defibrillator) in place    CRT-D placed March, 2013  . Ischemic cardiomyopathy    MRI12/12 no real viability in hypo/akinetic segment  CAth native and graft disease  . IVCD (intraventricular conduction defect)   . Personal history of colonic adenomas 12/24/2011  . Polysubstance    Alcohol, cocaine- resoloved for many years     Past Surgical History:  Procedure Laterality Date  . BI-VENTRICULAR IMPLANTABLE CARDIOVERTER DEFIBRILLATOR N/A 04/01/2011   Procedure: BI-VENTRICULAR IMPLANTABLE CARDIOVERTER DEFIBRILLATOR  (CRT-D);  Surgeon: Deboraha Sprang, MD;  Location: [facility] CATH LAB;  Service: Cardiovascular;  Laterality: N/A;  . CARDIAC DEFIBRILLATOR PLACEMENT    . COLONOSCOPY  12/24/2011   Procedure: COLONOSCOPY;  Surgeon: [name], MD;  Location: [facility] ENDOSCOPY;  Service: Endoscopy;  Laterality: N/A;  . CORONARY ARTERY BYPASS GRAFT  2003  . CYSTECTOMY     Upper Back  . TONSILLECTOMY      Social History   Social History  . Marital status: Single    Spouse name: N/A  . Number of children: 0  . Years of education: N/A   Occupational History  . musician    Social History Main Topics  . Smoking status: Never Smoker  . Smokeless tobacco: Never Used  . Alcohol use No     Comment: Hx of abuse/clean since 2003   . Drug use: No     Comment: Hx of abuse/clean for many years  . Sexual activity: Not on file   Other Topics Concern  . 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 apixaban (ELIQUIS) 5 MG TABS tablet Take 1 tablet (5 mg total) by mouth 2 (two) times daily. 180 tablet 0  . atorvastatin (LIPITOR) 80 MG tablet Take 80 mg by mouth daily.  6  . BAYER MICROLET LANCETS lancets Use to check blood sugar 2 times per day. 100 each 12  . benazepril (LOTENSIN) 20 MG tablet Take 0.5 tablets (10 mg total) by mouth daily. 90 tablet 2  . Blood Glucose Monitoring Suppl (CONTOUR NEXT EZ MONITOR) w/Device KIT Use to check blood sugar 2 times per day. 1 kit 2  . carvedilol (COREG) 25 MG tablet TAKE 1 TABLET (25 MG TOTAL) BY MOUTH 2 (TWO) TIMES DAILY WITH A MEAL. 60 tablet 8  . digoxin (LANOXIN) 0.125 MG tablet TAKE ONE TABLET BY MOUTH ONE TIME DAILY. 90 tablet 1  . furosemide (LASIX) 20 MG tablet TAKE 1  TABLET (20 MG TOTAL) BY MOUTH DAILY. 90 tablet 3  . glucose blood (BAYER CONTOUR NEXT TEST) test strip Use to check blood sugar 2 times per day. 100 each 12  . levothyroxine (SYNTHROID, LEVOTHROID) 88 MCG tablet Take 1 tablet (88 mcg total) by mouth daily before breakfast. 30 tablet 0  . NOVOFINE 32G X 6 MM MISC TEST THREE TIMES DAILY AS DIRECTED 100 each 5  . NOVOLOG FLEXPEN 100 UNIT/ML FlexPen INJECT 3 TIMES A DAY (JUST BEFORE EACH MEAL), 01-10-31 UNITS. 30 mL 1  . ONETOUCH DELICA LANCETS FINE MISC TEST TWICE DAILY AS DIRECTED. 100 each 2  . [DISCONTINUED] glimepiride (AMARYL) 4 MG tablet 1.5 tablet a day 30 tablet 11   No current facility-administered medications on file prior to visit.     No Known Allergies  Family History  Problem Relation Age of Onset  . Adopted: Yes  . Family history unknown: Yes    BP 112/74   Pulse 77   Wt 200 lb 6.4 oz (90.9 kg)   SpO2 98%   BMI 26.44 kg/m    Review of Systems He denies hypoglycemia.      Objective:   Physical Exam VITAL SIGNS:  See vs page GENERAL: no distress Pulses: foot pulses are intact bilaterally.   MSK: no deformity of the feet or ankles.  CV: no edema of the legs or ankles Skin:  no ulcer on the feet or ankles.  normal color and temp on the feet and ankles.  Old healed surgical scar (vein harvest) at the right leg.   Neuro: sensation is intact to touch on the feet, but decreased from normal.    A1c=6.5%    Assessment & Plan:  Type 1 DM, with PAD: well-controlled Renal insuff: he does not need basal insulin.  Pattern of cbg's supports this.  Patient Instructions  Please continue the same novolog: 3 times a day (just before each meal), 01-10-31 units check your blood sugar twice a day.  vary the time of day when you check, between before the 3 meals, and at bedtime.  also check if you have symptoms of your blood sugar being too high or too low.  please keep a record of the readings and bring it to your next  appointment here.  You can write it on any piece of paper.  please call us sooner if your blood sugar goes below 70, or if you have a lot of readings over 200.   Please come back for a follow-up appointment in 4-6 months.

## 2016-09-16 NOTE — Patient Instructions (Signed)
Please continue the same novolog: 3 times a day (just before each meal), 01-10-31 units check your blood sugar twice a day.  vary the time of day when you check, between before the 3 meals, and at bedtime.  also check if you have symptoms of your blood sugar being too high or too low.  please keep a record of the readings and bring it to your next appointment here.  You can write it on any piece of paper.  please call us sooner if your blood sugar goes below 70, or if you have a lot of readings over 200.   Please come back for a follow-up appointment in 4-6 months.

## 2016-11-13 ENCOUNTER — Other Ambulatory Visit: Payer: Self-pay | Admitting: *Deleted

## 2016-11-13 DIAGNOSIS — I779 Disorder of arteries and arterioles, unspecified: Secondary | ICD-10-CM

## 2016-11-13 DIAGNOSIS — I739 Peripheral vascular disease, unspecified: Principal | ICD-10-CM

## 2016-12-24 ENCOUNTER — Telehealth: Payer: Self-pay | Admitting: Cardiology

## 2016-12-24 NOTE — Telephone Encounter (Signed)
Patient called and stated that he felt his device vibrate twice today. Instructed pt to send a remote transmission w/ his home monitor and that he may not receive a call back until Wednesday 12-25-16. Pt verbalized understanding.

## 2016-12-25 NOTE — Telephone Encounter (Signed)
Transmission received. ICD ERI 12/24/16.  Caleb Bell made aware that his device is ready for generator replacement, the vibratory alert will time out and that I will have a scheduler get in touch with him to arrange f/u with Dr. Caryl Comes. He verbalizes understanding and is appreciative.

## 2016-12-30 ENCOUNTER — Other Ambulatory Visit: Payer: Self-pay | Admitting: Internal Medicine

## 2016-12-31 ENCOUNTER — Other Ambulatory Visit: Payer: Self-pay | Admitting: Endocrinology

## 2017-01-08 DIAGNOSIS — R7989 Other specified abnormal findings of blood chemistry: Secondary | ICD-10-CM | POA: Insufficient documentation

## 2017-01-08 DIAGNOSIS — E785 Hyperlipidemia, unspecified: Secondary | ICD-10-CM | POA: Insufficient documentation

## 2017-01-08 DIAGNOSIS — E119 Type 2 diabetes mellitus without complications: Secondary | ICD-10-CM | POA: Insufficient documentation

## 2017-01-08 DIAGNOSIS — N189 Chronic kidney disease, unspecified: Secondary | ICD-10-CM | POA: Insufficient documentation

## 2017-01-08 DIAGNOSIS — I454 Nonspecific intraventricular block: Secondary | ICD-10-CM | POA: Insufficient documentation

## 2017-01-08 DIAGNOSIS — I255 Ischemic cardiomyopathy: Secondary | ICD-10-CM | POA: Insufficient documentation

## 2017-01-08 DIAGNOSIS — E039 Hypothyroidism, unspecified: Secondary | ICD-10-CM | POA: Insufficient documentation

## 2017-01-08 DIAGNOSIS — I5022 Chronic systolic (congestive) heart failure: Secondary | ICD-10-CM | POA: Insufficient documentation

## 2017-01-21 NOTE — Progress Notes (Signed)
Cardiology Office Note Date:  01/23/2017  Patient ID:  Caleb Bell, Caleb Bell 1955-10-28, MRN [id number] PCP:  Lavone Orn, MD  Cardiologist:  Dr. Harrington Challenger Electrophysiologist: Dr. Caryl Comes   Chief Complaint: device has reached ERI  History of Present Illness: Caleb Bell is a 62 y.o. male with history of CAD (CABG in 2003), chronic CHF (systolic), ICM, ICVD w/CRT/D, PAFib, CRI, Hodgkin's Dz treated with chemo and XRT, HTN, HLD, DM.  He comes today to be seen for Dr. Caryl Comes, last seen by  EP service, [name], NP Nov 2017, at that time was doing well from a device/CHF standpoint.  He feels very well.  Denies any difficulties with ADLs, walks "a couple miles" most days and daily push-up without changes to his exertional capacity.  Denies CP of any kind, no palpitations, SOB or DOE, no dizziness, near syncope or syncope, no shocks.   Device History: STJ CRTD implanted 2013 for ICM, CHF History of appropriate therapy: No History of AAD therapy: No  Past Medical History:  Diagnosis Date  . Atrial fibrillation (HCC)    Paroxysmal, limited  . CAD (coronary artery disease)    CABG 2003  . Chronic systolic heart failure (HCC)    EF 20-25% echo 5- 2011  . CRI (chronic renal insufficiency)     multifactorial... Dr. Posey Pronto.. consult September 18, 2009  . DM type 2 (diabetes mellitus, type 2) (HCC)    Dr Loanne Drilling  . Ejection fraction < 50%    EF 25%, echo, November, 2012  //   planning followup 2-D echo to assess LV function with CRT D.  device in place  . Elevated serum creatinine   . Hodgkin's disease     Dx aprox 2004, s/p XRT-Chemo  . Hyperkalemia    August, 2011, Aldactone, Aldactone stopped  . Hyperlipidemia    low HDL  . Hypothyroidism   . ICD (implantable cardiac defibrillator) in place    CRT-D placed March, 2013  . Ischemic cardiomyopathy    MRI12/12 no real viability in hypo/akinetic segment  CAth native and graft disease  . IVCD (intraventricular conduction defect)   .  Personal history of colonic adenomas 12/24/2011  . Polysubstance    Alcohol, cocaine- resoloved for many years     Past Surgical History:  Procedure Laterality Date  . BI-VENTRICULAR IMPLANTABLE CARDIOVERTER DEFIBRILLATOR N/A 04/01/2011   Procedure: BI-VENTRICULAR IMPLANTABLE CARDIOVERTER DEFIBRILLATOR  (CRT-D);  Surgeon: Deboraha Sprang, MD;  Location[facility] CATH LAB;  Service: Cardiovascular;  Laterality: N/A;  . CARDIAC DEFIBRILLATOR PLACEMENT    . COLONOSCOPY  12/24/2011   Procedure: COLONOSCOPY;  Surgeon: [name], MD;  Location: [facility] ENDOSCOPY;  Service: Endoscopy;  Laterality: N/A;  . CORONARY ARTERY BYPASS GRAFT  2003  . CYSTECTOMY     Upper Back  . TONSILLECTOMY      Current Outpatient Medications  Medication Sig Dispense Refill  . apixaban (ELIQUIS) 5 MG TABS tablet Take 1 tablet (5 mg total) by mouth 2 (two) times daily. 180 tablet 0  . atorvastatin (LIPITOR) 80 MG tablet Take 80 mg by mouth daily.  6  . BAYER MICROLET LANCETS lancets Use to check blood sugar 2 times per day. 100 each 12  . benazepril (LOTENSIN) 20 MG tablet Take 0.5 tablets (10 mg total) by mouth daily. 90 tablet 2  . Blood Glucose Monitoring Suppl (CONTOUR NEXT EZ MONITOR) w/Device KIT Use to check blood sugar 2 times per day. 1 kit 2  . carvedilol (COREG)  25 MG tablet Take 1 tablet (25 mg total) by mouth 2 (two) times daily with a meal. Please make yearly appt with Dr. Harrington Challenger for February. Thank you 60 tablet 2  . digoxin (LANOXIN) 0.125 MG tablet TAKE ONE TABLET BY MOUTH ONE TIME DAILY. 90 tablet 1  . glucose blood (BAYER CONTOUR NEXT TEST) test strip Use to check blood sugar 2 times per day. 100 each 12  . levothyroxine (SYNTHROID, LEVOTHROID) 88 MCG tablet Take 1 tablet (88 mcg total) by mouth daily before breakfast. 30 tablet 0  . NOVOFINE 32G X 6 MM MISC TEST THREE TIMES DAILY AS DIRECTED 100 each 5  . NOVOLOG FLEXPEN 100 UNIT/ML FlexPen INJECT 3 TIMES A DAY (JUST BEFORE EACH MEAL), 01-10-31 UNITS. 10 pen  11  . ONETOUCH DELICA LANCETS FINE MISC TEST TWICE DAILY AS DIRECTED. 100 each 2   No current facility-administered medications for this visit.     Allergies:   Patient has no known allergies.   Social History:  The patient  reports that  has never smoked. he has never used smokeless tobacco. He reports that he does not drink alcohol or use drugs.   Family History:  The patient's He was adopted. Family history is unknown by patient.  ROS:  Please see the history of present illness.    All other systems are reviewed and otherwise negative.   PHYSICAL EXAM:  VS:  BP 126/72   Pulse 62   Ht 6' (1.829 m)   Wt 203 lb 12.8 oz (92.4 kg)   SpO2 98%   BMI 27.64 kg/m  BMI: Body mass index is 27.64 kg/m. Well nourished, well developed, in no acute distress  HEENT: normocephalic, atraumatic  Neck: no JVD, carotid bruits or masses Cardiac:  RRR; no significant murmurs, no rubs, or gallops Lungs:  CTA b/l, no wheezing, rhonchi or rales  Abd: soft, nontender MS: no deformity or atrophy Ext: no edema  Skin: warm and dry, no rash Neuro:  No gross deficits appreciated Psych: euthymic mood, full affect  ICD site is stable, no tethering or discomfort   EKG:  Done today shows SR, V paced ICD interrogation done today with industry and reviewed by myself:  Battery has reached ERI, Dec 4,2018.  RA/LV lead measurements are good, RV lead threshold has increased slowly over the last couple years, today is 2V/0.15m and 1.7V/1.035m(last 1.5/0.81m29m year ago, and 1.25V/0.81ms581m 2016), RV lead impedance and sensing remain stable.  2 AMS episodes very brief true AF, longest 56seconds, one NSVT 15beats..  Discussed with Dr. KleiCaryl Comesde aware RV lead, cap confirm was programmed on with 0.5V margin.  12/23/13: TTE Study Conclusions - Left ventricle: The cavity size was moderately dilated. Wall thickness was normal. Systolic function was severely reduced. The estimated ejection fraction was in the range  of 25% to 30%. Global hypokinesis with inferior and lateral akinesis. Doppler parameters are consistent with abnormal left ventricular relaxation (grade 1 diastolic dysfunction). The E/e&' ratio is between 8-15, suggesting indeterminate LV filling pressure. - Aortic valve: Sclerosis without stenosis. There was no regurgitation. - Mitral valve: Structurally normal valve. There was mild regurgitation. - Left atrium: The atrium was mildly dilated. - Right ventricle: The cavity size was mildly dilated. Wall thickness was normal. - Pulmonary arteries: PA peak pressure: 32 mm Hg (S). Impressions - Compared to the prior echo in 2013, there has been mild improvement in EF to 25-30%.  10/29/11: LVEF 20-25% 08/01/10: LVEF 25% 06/14/09: LVEF  40% 07/03/01: LVEF 25-35%   Recent Labs: No results found for requested labs within last 8760 hours.  07/12/2016: Chol/HDL Ratio 4.6; Cholesterol, Total 146; HDL 32; LDL Calculated 57; Triglycerides 284   CrCl cannot be calculated (Patient's most recent lab result is older than the maximum 21 days allowed.).   Wt Readings from Last 3 Encounters:  01/23/17 203 lb 12.8 oz (92.4 kg)  09/16/16 200 lb 6.4 oz (90.9 kg)  03/18/16 203 lb (92.1 kg)     Other studies reviewed: Additional studies/records reviewed today include: summarized above  ASSESSMENT AND PLAN:  1. ICD     device has reached ERI     Echos over the years back to 2003 with persistent CM, last in 2015     hold Eliquis the day prior and day of his procedure     Reviewed device check findings with Dr. Caryl Comes, no plans for RV lead revision at this time     Generator change procedure, risks and benefits were discussed with the patient, he would like to proceed.  2. CAD      On BB, statin, no ASA w/eliquis     No anginal complaints     C/w Dr. Harrington Challenger  3. ICM     examand CorVue do not suggest fluid OL     Excellent exertional capacity     On BB/ACE, lanoxin     Will get dig  level with pre-op labs     He reported lasix was stopped by his PMD, he thinks having to do with his kidneys, he has not noted any swelling, bloating      4. HTN     Looks good, no changes  5. Paroxysmal Afib     CHA2DS2Vasc is 5, on Eliquis     Patient reports labs are done routinely with his PMD, not in Epic, will be getting pre-op labs   Disposition: F/u with ICD gen change with Dr. Caryl Comes, pre-op labs including dig level,  will update his echo as well.   Current medicines are reviewed at length with the patient today.  The patient did not have any concerns regarding medicines.  Venetia Night, PA-C 01/23/2017 2:53 PM     [place] [address] [phone] (office)  [phone] (fax)

## 2017-01-23 ENCOUNTER — Ambulatory Visit: Payer: Managed Care, Other (non HMO) | Admitting: Physician Assistant

## 2017-01-23 ENCOUNTER — Encounter: Payer: Self-pay | Admitting: Physician Assistant

## 2017-01-23 ENCOUNTER — Encounter: Payer: Self-pay | Admitting: *Deleted

## 2017-01-23 VITALS — BP 126/72 | HR 62 | Ht 72.0 in | Wt 203.8 lb

## 2017-01-23 DIAGNOSIS — I2510 Atherosclerotic heart disease of native coronary artery without angina pectoris: Secondary | ICD-10-CM | POA: Diagnosis not present

## 2017-01-23 DIAGNOSIS — Z4502 Encounter for adjustment and management of automatic implantable cardiac defibrillator: Secondary | ICD-10-CM | POA: Diagnosis not present

## 2017-01-23 DIAGNOSIS — I255 Ischemic cardiomyopathy: Secondary | ICD-10-CM

## 2017-01-23 DIAGNOSIS — I10 Essential (primary) hypertension: Secondary | ICD-10-CM | POA: Diagnosis not present

## 2017-01-23 DIAGNOSIS — I480 Paroxysmal atrial fibrillation: Secondary | ICD-10-CM | POA: Diagnosis not present

## 2017-01-23 NOTE — Patient Instructions (Addendum)
Medication Instructions:   Your physician recommends that you continue on your current medications as directed. Please refer to the Current Medication list given to you today.    If you need a refill on your cardiac medications before your next appointment, please call your pharmacy.  Labwork:  RETURN FOR LAB WORK  BMET  CBC AND DIGOXIN  LEVEL    Testing/Procedures: SEE LETTER FOR GENERATOR CHANGE ON 03-10-17 WITH DR Caryl Comes    Your physician has requested that you have an echocardiogram. Echocardiography is a painless test that uses sound waves to create images of your heart. It provides your doctor with information about the size and shape of your heart and how well your heart's chambers and valves are working. This procedure takes approximately one hour. There are no restrictions for this procedure.   Follow-Up:   AFTER 03-10-2017  10 DAY  WOUND CHECK WITH DEVICE CLINIC  24 DAY PHYS DEFIB WITH DR. Caryl Comes    Any Other Special Instructions Will Be Listed Below (If Applicable).

## 2017-01-24 ENCOUNTER — Telehealth: Payer: Self-pay | Admitting: *Deleted

## 2017-01-24 ENCOUNTER — Encounter: Payer: Self-pay | Admitting: *Deleted

## 2017-01-24 NOTE — Telephone Encounter (Signed)
SPOKE TO PT ABOUT RETURNING TO PICK UP  ANTISEPTIC SCRUB AND   PROCEDURE LETTER FOR GEN CHANGE ON 03-10-17.  PT STATED HE WILL PICK UP SCRUB AND LETTER ON THE DAY HE COMES TO GET  TO LAB WORK.

## 2017-02-24 ENCOUNTER — Other Ambulatory Visit: Payer: Self-pay

## 2017-02-24 ENCOUNTER — Encounter: Payer: Self-pay | Admitting: Internal Medicine

## 2017-02-24 ENCOUNTER — Telehealth: Payer: Self-pay | Admitting: Internal Medicine

## 2017-02-24 MED ORDER — INSULIN LISPRO 100 UNIT/ML (KWIKPEN): PEN_INJECTOR | SUBCUTANEOUS | 2 refills | Status: DC

## 2017-02-24 NOTE — Telephone Encounter (Signed)
Per Barbera Setters, schedule with APP or Dr. Carlean Purl for Office visit.  Spoke to patient and he states that he doesn't want to schedule this appointment right now and will callback to schedule.

## 2017-02-26 ENCOUNTER — Ambulatory Visit (HOSPITAL_COMMUNITY): Payer: Managed Care, Other (non HMO) | Attending: Cardiology

## 2017-02-26 ENCOUNTER — Other Ambulatory Visit: Payer: Self-pay

## 2017-02-26 ENCOUNTER — Other Ambulatory Visit: Payer: Managed Care, Other (non HMO) | Admitting: *Deleted

## 2017-02-26 DIAGNOSIS — I350 Nonrheumatic aortic (valve) stenosis: Secondary | ICD-10-CM | POA: Diagnosis not present

## 2017-02-26 DIAGNOSIS — I255 Ischemic cardiomyopathy: Secondary | ICD-10-CM | POA: Insufficient documentation

## 2017-02-26 DIAGNOSIS — I480 Paroxysmal atrial fibrillation: Secondary | ICD-10-CM | POA: Diagnosis not present

## 2017-02-26 DIAGNOSIS — E785 Hyperlipidemia, unspecified: Secondary | ICD-10-CM | POA: Insufficient documentation

## 2017-02-26 DIAGNOSIS — I509 Heart failure, unspecified: Secondary | ICD-10-CM | POA: Diagnosis not present

## 2017-02-26 DIAGNOSIS — Z95811 Presence of heart assist device: Secondary | ICD-10-CM | POA: Insufficient documentation

## 2017-02-26 LAB — BASIC METABOLIC PANEL
BUN/Creatinine Ratio: 22 (ref 10–24)
BUN: 31 mg/dL — ABNORMAL HIGH (ref 8–27)
CO2: 24 mmol/L (ref 20–29)
Calcium: 9.5 mg/dL (ref 8.6–10.2)
Chloride: 96 mmol/L (ref 96–106)
Creatinine, Ser: 1.39 mg/dL — ABNORMAL HIGH (ref 0.76–1.27)
GFR calc Af Amer: 63 mL/min/{1.73_m2} (ref >59)
GFR calc non Af Amer: 55 mL/min/{1.73_m2} — ABNORMAL LOW (ref >59)
Glucose: 163 mg/dL — ABNORMAL HIGH (ref 65–99)
Potassium: 4.7 mmol/L (ref 3.5–5.2)
Sodium: 134 mmol/L (ref 134–144)

## 2017-02-26 LAB — CBC
Hematocrit: 36.9 % — ABNORMAL LOW (ref 37.5–51.0)
Hemoglobin: 12.9 g/dL — ABNORMAL LOW (ref 13.0–17.7)
MCH: 28 pg (ref 26.6–33.0)
MCHC: 35 g/dL (ref 31.5–35.7)
MCV: 80 fL (ref 79–97)
Platelets: 218 10*3/uL (ref 150–379)
RBC: 4.6 x10E6/uL (ref 4.14–5.80)
RDW: 14.4 % (ref 12.3–15.4)
WBC: 8 10*3/uL (ref 3.4–10.8)

## 2017-02-26 LAB — DIGOXIN LEVEL: Digoxin, Serum: 0.8 ng/mL (ref 0.5–0.9)

## 2017-03-04 ENCOUNTER — Telehealth: Payer: Self-pay

## 2017-03-04 ENCOUNTER — Other Ambulatory Visit: Payer: Self-pay

## 2017-03-04 NOTE — Telephone Encounter (Signed)
[facility] & gave verbal to switch patient to U 200 same dosage.

## 2017-03-04 NOTE — Telephone Encounter (Signed)
Pharmacy is asking if pt can be switched to Humalog U 200 because they have a coupon for it. States Humalog U 100 is more expensive and they do not have a coupon for it.

## 2017-03-04 NOTE — Telephone Encounter (Signed)
Ok.  Same dosage 

## 2017-03-07 ENCOUNTER — Ambulatory Visit (HOSPITAL_COMMUNITY): Admit: 2017-03-07 | Payer: Managed Care, Other (non HMO) | Admitting: Internal Medicine

## 2017-03-07 ENCOUNTER — Encounter (HOSPITAL_COMMUNITY): Payer: Self-pay

## 2017-03-07 SURGERY — BIV ICD GENERATOR CHANGEOUT

## 2017-03-09 DIAGNOSIS — I428 Other cardiomyopathies: Secondary | ICD-10-CM

## 2017-03-09 NOTE — H&P (Signed)
Patient Care Team: Lavone Orn, MD as PCP - General (Internal Medicine) Elmarie Shiley, MD (Nephrology) Deboraha Sprang, MD (Cardiology) Renato Shin, MD as Consulting Physician (Endocrinology)   HPI  Caleb Bell is a 62 y.o. male admitted for ICD generator replacement and possible lead revision   There is also been interval increase in RV pacing threshold--(2016) 1.25 at 0.8 AND now 2.0@0 .8   He has a history of ischemic cardiomyopathy, s/p CABG 2003 congestive heart failure and an IVCD with a broad QRS status post St Jude CRT-D implantation (March 2013).  3/15 reprogramming of the VV timing resulted in narrowing of the QRS   DATE TEST EF   10/13 Echo  20-25 %   12/15 Echo   20.25 %   2/19 Echo  30-35%    Remote history also notable for Hodgkin's disease and PAF.  The patient denies chest pain, shortness of breath, nocturnal dyspnea, orthopnea or peripheral edema.  There have been no palpitations, lightheadedness or syncope.    Records and Results Reviewed19  Past Medical History:  Diagnosis Date  . Atrial fibrillation (HCC)    Paroxysmal, limited  . CAD (coronary artery disease)    CABG 2003  . Chronic systolic heart failure (HCC)    EF 20-25% echo 5- 2011  . CRI (chronic renal insufficiency)     multifactorial... Dr. Posey Pronto.. consult September 18, 2009  . DM type 2 (diabetes mellitus, type 2) (HCC)    Dr Loanne Drilling  . Ejection fraction < 50%    EF 25%, echo, November, 2012  //   planning followup 2-D echo to assess LV function with CRT D.  device in place  . Elevated serum creatinine   . Hodgkin's disease     Dx aprox 2004, s/p XRT-Chemo  . Hyperkalemia    August, 2011, Aldactone, Aldactone stopped  . Hyperlipidemia    low HDL  . Hypothyroidism   . ICD (implantable cardiac defibrillator) in place    CRT-D placed March, 2013  . Ischemic cardiomyopathy    MRI12/12 no real viability in hypo/akinetic segment  CAth native and graft disease  . IVCD  (intraventricular conduction defect)   . Personal history of colonic adenomas 12/24/2011  . Polysubstance    Alcohol, cocaine- resoloved for many years     Past Surgical History:  Procedure Laterality Date  . BI-VENTRICULAR IMPLANTABLE CARDIOVERTER DEFIBRILLATOR N/A 04/01/2011   Procedure: BI-VENTRICULAR IMPLANTABLE CARDIOVERTER DEFIBRILLATOR  (CRT-D);  Surgeon: Deboraha Sprang, MD;  Location: [facility] CATH LAB;  Service: Cardiovascular;  Laterality: N/A;  . CARDIAC DEFIBRILLATOR PLACEMENT    . COLONOSCOPY  12/24/2011   Procedure: COLONOSCOPY;  Surgeon: [name], MD;  Location: [facility] ENDOSCOPY;  Service: Endoscopy;  Laterality: N/A;  . CORONARY ARTERY BYPASS GRAFT  2003  . CYSTECTOMY     Upper Back  . TONSILLECTOMY      Current Facility-Administered Medications  Medication Dose Route Frequency Provider Last Rate Last Dose  . 0.9 %  sodium chloride infusion   Intravenous Continuous Deboraha Sprang, MD 50 mL/hr at 03/10/17 [phone]    . ceFAZolin (ANCEF) IVPB 2g/100 mL premix  2 g Intravenous On Call Deboraha Sprang, MD      . chlorhexidine (HIBICLENS) 4 % liquid 4 application  60 mL Topical Once Deboraha Sprang, MD      . gentamicin (GARAMYCIN) 80 mg in sodium chloride irrigation 0.9 % 500 mL irrigation  80 mg Irrigation On Call Caryl Comes,  Revonda Standard, MD        No Known Allergies    Social History   Tobacco Use  . Smoking status: Never Smoker  . Smokeless tobacco: Never Used  Substance Use Topics  . Alcohol use: No    Alcohol/week: 0.0 oz    Comment: Hx of abuse/clean since 2003   . Drug use: No    Comment: Hx of abuse/clean for many years     Family History  Adopted: Yes  Family history unknown: Yes     Current Meds  Medication Sig  . acetaminophen (TYLENOL) 500 MG tablet Take 1,000 mg by mouth every 8 (eight) hours as needed for mild pain or headache.  Marland Kitchen apixaban (ELIQUIS) 5 MG TABS tablet Take 1 tablet (5 mg total) by mouth 2 (two) times daily.  Marland Kitchen atorvastatin (LIPITOR) 80 MG  tablet Take 80 mg by mouth daily at 6 PM.   . benazepril (LOTENSIN) 5 MG tablet Take 5 mg by mouth daily.  . carvedilol (COREG) 25 MG tablet Take 1 tablet (25 mg total) by mouth 2 (two) times daily with a meal. Please make yearly appt with Dr. Harrington Challenger for February. Thank you  . digoxin (LANOXIN) 0.125 MG tablet TAKE ONE TABLET BY MOUTH ONE TIME DAILY. (Patient taking differently: Take 0.125 mg by mouth daily. )  . insulin lispro (HUMALOG KWIKPEN) 100 UNIT/ML KiwkPen Inject 12 units at breakfast 20 units at lunch and 32 units at dinner (Patient taking differently: Inject 12-32 Units into the skin See admin instructions. Inject 12 units at breakfast 20 units at lunch and 32 units at dinner)  . levothyroxine (SYNTHROID, LEVOTHROID) 88 MCG tablet Take 1 tablet (88 mcg total) by mouth daily before breakfast.  . NOVOLOG FLEXPEN 100 UNIT/ML FlexPen INJECT 3 TIMES A DAY (JUST BEFORE EACH MEAL), 01-10-31 UNITS.     Review of Systems negative except from HPI and PMH  Physical Exam BP 128/62 (BP Location: Right Arm)   Pulse 69   Temp 97.6 F (36.4 C) (Oral)   Ht 6\' 1"  (1.854 m)   Wt 201 lb (91.2 kg)   SpO2 100%   BMI 26.52 kg/m  Well developed and well nourished in no acute distress HENT normal E scleral and icterus clear Neck Supple JVP flat; carotids brisk and full Clear to ausculation Device pocket well healed; without hematoma or erythema.  There is no tethering Regular rate and rhythm, no murmurs gallops or rub Soft with active bowel sounds No clubbing cyanosis  Edema Alert and oriented, grossly normal motor and sensory function Skin Warm and Dry    Assessment and  Plan  Atrial fibrillation  Ischemic cardiomyopathy  Congestive heart failure-chronic systolic  CRT-D.-St. Jude  Thromboembolic risk factors   diabetes hypertension vascular idisease and CHF   CHADS-VASc score of greater than or equal to 4  Renal insufficiency grade 3    Have reviewed the potential  benefits and risks of ICD implantation including but not limited to death, perforation of heart or lung, lead dislodgement, infection,  device malfunction and inappropriate shocks.  The patient express understanding  and are willing to proceed.    We will assess RV lead at change  AsLV pacing is the more important will have a relatively high threshold for implanting a new lead

## 2017-03-10 ENCOUNTER — Ambulatory Visit (HOSPITAL_COMMUNITY)
Admission: RE | Disposition: A | Discharge status: Home / Self Care | Payer: Self-pay | Source: Ambulatory Visit | Attending: Internal Medicine

## 2017-03-10 ENCOUNTER — Encounter (HOSPITAL_COMMUNITY): Payer: Self-pay | Admitting: Internal Medicine

## 2017-03-10 ENCOUNTER — Ambulatory Visit (HOSPITAL_COMMUNITY)
Admission: RE | Admit: 2017-03-10 | Discharge: 2017-03-10 | Disposition: A | Discharge status: Home / Self Care | Payer: Managed Care, Other (non HMO) | Source: Ambulatory Visit | Attending: Internal Medicine | Admitting: Internal Medicine

## 2017-03-10 DIAGNOSIS — E039 Hypothyroidism, unspecified: Secondary | ICD-10-CM | POA: Insufficient documentation

## 2017-03-10 DIAGNOSIS — I2510 Atherosclerotic heart disease of native coronary artery without angina pectoris: Secondary | ICD-10-CM | POA: Diagnosis not present

## 2017-03-10 DIAGNOSIS — I252 Old myocardial infarction: Secondary | ICD-10-CM | POA: Insufficient documentation

## 2017-03-10 DIAGNOSIS — N183 Chronic kidney disease, stage 3 (moderate): Secondary | ICD-10-CM | POA: Diagnosis not present

## 2017-03-10 DIAGNOSIS — E785 Hyperlipidemia, unspecified: Secondary | ICD-10-CM | POA: Diagnosis not present

## 2017-03-10 DIAGNOSIS — E1122 Type 2 diabetes mellitus with diabetic chronic kidney disease: Secondary | ICD-10-CM | POA: Diagnosis not present

## 2017-03-10 DIAGNOSIS — Z951 Presence of aortocoronary bypass graft: Secondary | ICD-10-CM | POA: Diagnosis not present

## 2017-03-10 DIAGNOSIS — I255 Ischemic cardiomyopathy: Secondary | ICD-10-CM | POA: Diagnosis not present

## 2017-03-10 DIAGNOSIS — Z95810 Presence of automatic (implantable) cardiac defibrillator: Secondary | ICD-10-CM | POA: Diagnosis present

## 2017-03-10 DIAGNOSIS — Z7901 Long term (current) use of anticoagulants: Secondary | ICD-10-CM | POA: Insufficient documentation

## 2017-03-10 DIAGNOSIS — I130 Hypertensive heart and chronic kidney disease with heart failure and stage 1 through stage 4 chronic kidney disease, or unspecified chronic kidney disease: Secondary | ICD-10-CM | POA: Diagnosis not present

## 2017-03-10 DIAGNOSIS — I481 Persistent atrial fibrillation: Secondary | ICD-10-CM | POA: Insufficient documentation

## 2017-03-10 DIAGNOSIS — Z4502 Encounter for adjustment and management of automatic implantable cardiac defibrillator: Secondary | ICD-10-CM | POA: Diagnosis not present

## 2017-03-10 DIAGNOSIS — I5022 Chronic systolic (congestive) heart failure: Secondary | ICD-10-CM | POA: Diagnosis present

## 2017-03-10 DIAGNOSIS — Z794 Long term (current) use of insulin: Secondary | ICD-10-CM | POA: Diagnosis not present

## 2017-03-10 DIAGNOSIS — I454 Nonspecific intraventricular block: Secondary | ICD-10-CM | POA: Diagnosis present

## 2017-03-10 HISTORY — PX: BIV ICD GENERATOR CHANGEOUT: EP1194

## 2017-03-10 LAB — SURGICAL PCR SCREEN
MRSA, PCR: NEGATIVE
Staphylococcus aureus: NEGATIVE

## 2017-03-10 LAB — GLUCOSE, CAPILLARY: Glucose-Capillary: 140 mg/dL — ABNORMAL HIGH (ref 65–99)

## 2017-03-10 SURGERY — BIV ICD GENERATOR CHANGEOUT

## 2017-03-10 MED ORDER — LIDOCAINE HCL 1 % IJ SOLN
INTRAMUSCULAR | Status: AC
  Filled 2017-03-10: qty 20

## 2017-03-10 MED ORDER — SODIUM CHLORIDE 0.9 % IV SOLN: INTRAVENOUS | Status: AC

## 2017-03-10 MED ORDER — ACETAMINOPHEN 325 MG PO TABS
325.0000 mg | ORAL_TABLET | ORAL | Status: DC | PRN
  Filled 2017-03-10: qty 2

## 2017-03-10 MED ORDER — SODIUM CHLORIDE 0.9 % IV SOLN
INTRAVENOUS | Status: DC
  Administered 2017-03-10: 07:00:00 via INTRAVENOUS

## 2017-03-10 MED ORDER — CEFAZOLIN SODIUM-DEXTROSE 2-4 GM/100ML-% IV SOLN
INTRAVENOUS | Status: AC
  Filled 2017-03-10: qty 100

## 2017-03-10 MED ORDER — SODIUM CHLORIDE 0.9 % IR SOLN
Status: AC
  Filled 2017-03-10: qty 2

## 2017-03-10 MED ORDER — FENTANYL CITRATE (PF) 100 MCG/2ML IJ SOLN
INTRAMUSCULAR | Status: DC | PRN
  Administered 2017-03-10: 25 ug via INTRAVENOUS

## 2017-03-10 MED ORDER — CHLORHEXIDINE GLUCONATE 4 % EX LIQD: 60.0000 mL | Freq: Once | CUTANEOUS | Status: DC

## 2017-03-10 MED ORDER — MIDAZOLAM HCL 5 MG/5ML IJ SOLN
INTRAMUSCULAR | Status: AC
  Filled 2017-03-10: qty 5

## 2017-03-10 MED ORDER — ONDANSETRON HCL 4 MG/2ML IJ SOLN: 4.0000 mg | Freq: Four times a day (QID) | INTRAMUSCULAR | Status: DC | PRN

## 2017-03-10 MED ORDER — SODIUM CHLORIDE 0.9 % IR SOLN
80.0000 mg | Status: AC
  Administered 2017-03-10: 80 mg

## 2017-03-10 MED ORDER — MIDAZOLAM HCL 2 MG/2ML IJ SOLN
INTRAMUSCULAR | Status: DC | PRN
  Administered 2017-03-10 (×2): 2 mg via INTRAVENOUS

## 2017-03-10 MED ORDER — LIDOCAINE HCL (PF) 1 % IJ SOLN
INTRAMUSCULAR | Status: DC | PRN
  Administered 2017-03-10: 60 mL

## 2017-03-10 MED ORDER — FENTANYL CITRATE (PF) 100 MCG/2ML IJ SOLN
INTRAMUSCULAR | Status: AC
  Filled 2017-03-10: qty 2

## 2017-03-10 MED ORDER — MUPIROCIN 2 % EX OINT
TOPICAL_OINTMENT | CUTANEOUS | Status: AC
  Administered 2017-03-10: 1
  Filled 2017-03-10: qty 22

## 2017-03-10 MED ORDER — CEFAZOLIN SODIUM-DEXTROSE 2-4 GM/100ML-% IV SOLN
2.0000 g | INTRAVENOUS | Status: AC
  Administered 2017-03-10: 2 g via INTRAVENOUS

## 2017-03-10 SURGICAL SUPPLY — 5 items
CABLE SURGICAL S-101-97-12 (CABLE) ×2 IMPLANT
HEMOSTAT SURGICEL 2X4 FIBR (HEMOSTASIS) ×2 IMPLANT
ICD UNIFY ASURA CRT CD3357-40C (ICD Generator) ×2 IMPLANT
PAD DEFIB LIFELINK (PAD) ×2 IMPLANT
TRAY PACEMAKER INSERTION (PACKS) ×2 IMPLANT

## 2017-03-10 NOTE — Discharge Instructions (Signed)

## 2017-03-10 NOTE — Interval H&P Note (Signed)
ICD Criteria  Current LVEF:30%. Within 12 months prior to implant: Yes   Heart failure history: Yes, Class II  Cardiomyopathy history: Yes, Ischemic Cardiomyopathy - Prior MI.  Atrial Fibrillation/Atrial Flutter: Yes, Persistent (> 7 Days).  Ventricular tachycardia history: No.  Cardiac arrest history: No.  History of syndromes with risk of sudden death: No.  Previous ICD: Yes, Reason for ICD:  Primary prevention.  Current ICD indication: Primary  PPM indication: Yes. Pacing type: Ventricular. Greater than 40% RV pacing requirement anticipated. Indication: CRT    Class I or II Bradycardia indication present: No  Beta Blocker therapy for 3 or more months: Yes, prescribed.   Ace Inhibitor/ARB therapy for 3 or more months: Yes, prescribed.   History and Physical Interval Note:  03/10/2017 8:56 AM  Caleb Bell  has presented today for surgery, with the diagnosis of eri  The various methods of treatment have been discussed with the patient and family. After consideration of risks, benefits and other options for treatment, the patient has consented to  Procedure(s): BIV ICD [place] (N/A) as a surgical intervention .  The patient's history has been reviewed, patient examined, no change in status, stable for surgery.  I have reviewed the patient's chart and labs.  Questions were answered to the patient's satisfaction.     Virl Axe

## 2017-03-11 MED FILL — Lidocaine HCl Local Inj 1%: INTRAMUSCULAR | Qty: 20 | Status: AC

## 2017-03-11 MED FILL — Gentamicin Sulfate Inj 40 MG/ML: INTRAMUSCULAR | Qty: 80 | Status: AC

## 2017-03-11 MED FILL — Lidocaine HCl Local Inj 1%: INTRAMUSCULAR | Qty: 40 | Status: AC

## 2017-03-11 MED FILL — Cefazolin Sodium-Dextrose IV Solution 2 GM/100ML-4%: INTRAVENOUS | Qty: 100 | Status: AC

## 2017-03-19 ENCOUNTER — Encounter: Payer: Self-pay | Admitting: Endocrinology

## 2017-03-19 ENCOUNTER — Ambulatory Visit: Payer: BLUE CROSS/BLUE SHIELD | Admitting: Endocrinology

## 2017-03-19 VITALS — BP 108/80 | HR 76 | Ht 73.0 in | Wt 205.8 lb

## 2017-03-19 DIAGNOSIS — E119 Type 2 diabetes mellitus without complications: Secondary | ICD-10-CM

## 2017-03-19 LAB — POCT GLYCOSYLATED HEMOGLOBIN (HGB A1C): Hemoglobin A1C: 7.2

## 2017-03-19 MED ORDER — INSULIN LISPRO 100 UNIT/ML (KWIKPEN): PEN_INJECTOR | SUBCUTANEOUS | 2 refills | Status: DC

## 2017-03-19 NOTE — Patient Instructions (Addendum)
Please increase the humalog to 3 times a day (just before each meal), 01-09-33 units check your blood sugar twice a day.  vary the time of day when you check, between before the 3 meals, and at bedtime.  also check if you have symptoms of your blood sugar being too high or too low.  please keep a record of the readings and bring it to your next appointment here.  You can write it on any piece of paper.  please call us sooner if your blood sugar goes below 70, or if you have a lot of readings over 200.   Please come back for a follow-up appointment in 4-6 months.

## 2017-03-19 NOTE — Progress Notes (Signed)
Subjective:    Patient ID: Caleb Bell, male    DOB: 07-26-1955, 62 y.o.   MRN: [id number]  HPI Pt returns for f/u of diabetes mellitus: DM type: 1 Dx'ed: 0347 Complications: polyneuropathy, renal insuff, foot ulcer, retinopathy, PAD, and CAD.   Therapy: insulin since 2010.   DKA: never.   Severe hypoglycemia: never.    Pancreatitis: never.   Other: he takes multiple daily injections; he declines pump therapy; the pattern of his cbg's indicates he does not need basal insulin (prob due to renal insufficiency).    Interval history: pt states he feels well in general. Meter is downloaded today, and the printout is scanned into the record.  It varies from 130-200's.  There is no trend throughout the day.  Past Medical History:  Diagnosis Date  . Atrial fibrillation (HCC)    Paroxysmal, limited  . CAD (coronary artery disease)    CABG 2003  . Chronic systolic heart failure (HCC)    EF 20-25% echo 5- 2011  . CRI (chronic renal insufficiency)     multifactorial... Dr. Posey Pronto.. consult September 18, 2009  . DM type 2 (diabetes mellitus, type 2) (HCC)    Dr Loanne Drilling  . Ejection fraction < 50%    EF 25%, echo, November, 2012  //   planning followup 2-D echo to assess LV function with CRT D.  device in place  . Elevated serum creatinine   . Hodgkin's disease     Dx aprox 2004, s/p XRT-Chemo  . Hyperkalemia    August, 2011, Aldactone, Aldactone stopped  . Hyperlipidemia    low HDL  . Hypothyroidism   . ICD (implantable cardiac defibrillator) in place    CRT-D placed March, 2013  . Ischemic cardiomyopathy    MRI12/12 no real viability in hypo/akinetic segment  CAth native and graft disease  . IVCD (intraventricular conduction defect)   . Personal history of colonic adenomas 12/24/2011  . Polysubstance    Alcohol, cocaine- resoloved for many years     Past Surgical History:  Procedure Laterality Date  . BI-VENTRICULAR IMPLANTABLE CARDIOVERTER DEFIBRILLATOR N/A 04/01/2011   Procedure: BI-VENTRICULAR IMPLANTABLE CARDIOVERTER DEFIBRILLATOR  (CRT-D);  Surgeon: Deboraha Sprang, MD;  Location: [facility] CATH LAB;  Service: Cardiovascular;  Laterality: N/A;  . BIV ICD GENERATOR CHANGEOUT N/A 03/10/2017   Procedure: BIV ICD GENERATOR CHANGEOUT;  Surgeon: Deboraha Sprang, MD;  Location: [place] CV LAB;  Service: Cardiovascular;  Laterality: N/A;  . CARDIAC DEFIBRILLATOR PLACEMENT    . COLONOSCOPY  12/24/2011   Procedure: COLONOSCOPY;  Surgeon: [name], MD;  Location: [facility] ENDOSCOPY;  Service: Endoscopy;  Laterality: N/A;  . CORONARY ARTERY BYPASS GRAFT  2003  . CYSTECTOMY     Upper Back  . TONSILLECTOMY      Social History   Socioeconomic History  . Marital status: Married    Spouse name: Not on file  . Number of children: 0  . Years of education: Not on file  . Highest education level: Not on file  Social Needs  . Financial resource strain: Not on file  . Food insecurity - worry: Not on file  . Food insecurity - inability: Not on file  . Transportation needs - medical: Not on file  . Transportation needs - non-medical: Not on file  Occupational History  . Occupation: musician  Tobacco Use  . Smoking status: Never Smoker  . Smokeless tobacco: Never Used  Substance and Sexual Activity  . Alcohol use: No  Alcohol/week: 0.0 oz    Comment: Hx of abuse/clean since 2003   . Drug use: No    Comment: Hx of abuse/clean for many years  . Sexual activity: Not on file  Other Topics Concern  . 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 acetaminophen (TYLENOL) 500 MG tablet Take 1,000 mg by mouth every 8 (eight) hours as needed for mild pain or headache.    Marland Kitchen apixaban (ELIQUIS) 5 MG TABS tablet Take 1 tablet (5 mg total) by  mouth 2 (two) times daily. 180 tablet 0  . atorvastatin (LIPITOR) 80 MG tablet Take 80 mg by mouth daily at 6 PM.   6  . benazepril (LOTENSIN) 5 MG tablet Take 5 mg by mouth daily.    . carvedilol (COREG) 25 MG tablet Take 1 tablet (25 mg total) by mouth 2 (two) times daily with a meal. Please make yearly appt with Dr. Harrington Challenger for February. Thank you 60 tablet 2  . digoxin (LANOXIN) 0.125 MG tablet TAKE ONE TABLET BY MOUTH ONE TIME DAILY. (Patient taking differently: Take 0.125 mg by mouth daily. ) 90 tablet 1  . levothyroxine (SYNTHROID, LEVOTHROID) 88 MCG tablet Take 1 tablet (88 mcg total) by mouth daily before breakfast. 30 tablet 0  . NOVOLOG FLEXPEN 100 UNIT/ML FlexPen INJECT 3 TIMES A DAY (JUST BEFORE EACH MEAL), 01-10-31 UNITS. 10 pen 11  . [DISCONTINUED] glimepiride (AMARYL) 4 MG tablet 1.5 tablet a day 30 tablet 11   No current facility-administered medications on file prior to visit.     No Known Allergies  Family History  Adopted: Yes  Family history unknown: Yes    BP 108/80 (BP Location: Left Arm, Patient Position: Sitting, Cuff Size: Normal)   Pulse 76   Ht 6\' 1"  (1.854 m)   Wt 205 lb 12.8 oz (93.4 kg)   SpO2 98%   BMI 27.15 kg/m    Review of Systems He denies hypoglycemia    Objective:   Physical Exam VITAL SIGNS:  See vs page GENERAL: no distress Pulses: dorsalis pedis intact bilat.   MSK: no deformity of the feet CV: no leg edema Skin:  no ulcer on the feet.  normal color and temp on the feet. Neuro: sensation is intact to touch on the feet    Lab Results  Component Value Date   HGBA1C 7.2 03/19/2017   Lab Results  Component Value Date   CREATININE 1.39 (H) 02/26/2017   BUN 31 (H) 02/26/2017   NA 134 02/26/2017   K 4.7 02/26/2017   CL 96 02/26/2017   CO2 24 02/26/2017      Assessment & Plan:  Type 1 DM, with PAD: worse Renal insuff: in this setting, he does not need basal insulin.  Patient Instructions  Please increase the humalog to 3  times a day (just before each meal), 01-09-33 units check your blood sugar twice a day.  vary the time of day when you check, between before the 3 meals, and at bedtime.  also check if you have symptoms of your blood sugar being too high or too low.  please keep a record of the readings and bring it to your next  appointment here.  You can write it on any piece of paper.  please call us sooner if your blood sugar goes below 70, or if you have a lot of readings over 200.   Please come back for a follow-up appointment in 4-6 months.

## 2017-03-20 ENCOUNTER — Ambulatory Visit (INDEPENDENT_AMBULATORY_CARE_PROVIDER_SITE_OTHER): Payer: Managed Care, Other (non HMO) | Admitting: *Deleted

## 2017-03-20 DIAGNOSIS — I255 Ischemic cardiomyopathy: Secondary | ICD-10-CM | POA: Diagnosis not present

## 2017-03-20 DIAGNOSIS — I5022 Chronic systolic (congestive) heart failure: Secondary | ICD-10-CM

## 2017-03-20 LAB — CUP PACEART INCLINIC DEVICE CHECK
Battery Remaining Longevity: 66 mo
Brady Statistic RA Percent Paced: 81 %
Brady Statistic RV Percent Paced: 96 %
Date Time Interrogation Session: 20190228152458
HighPow Impedance: 50.625
Implantable Lead Implant Date: 20130311
Implantable Lead Implant Date: 20130311
Implantable Lead Implant Date: 20130311
Implantable Lead Location: 753858
Implantable Lead Location: 753859
Implantable Lead Location: 753860
Implantable Lead Model: 7122
Implantable Pulse Generator Implant Date: 20190218
Lead Channel Impedance Value: 387.5 Ohm
Lead Channel Impedance Value: 387.5 Ohm
Lead Channel Impedance Value: 787.5 Ohm
Lead Channel Pacing Threshold Amplitude: 1 V
Lead Channel Pacing Threshold Amplitude: 1 V
Lead Channel Pacing Threshold Amplitude: 1.25 V
Lead Channel Pacing Threshold Amplitude: 1.25 V
Lead Channel Pacing Threshold Amplitude: 1.75 V
Lead Channel Pacing Threshold Amplitude: 1.75 V
Lead Channel Pacing Threshold Pulse Width: 0.5 ms
Lead Channel Pacing Threshold Pulse Width: 0.5 ms
Lead Channel Pacing Threshold Pulse Width: 0.5 ms
Lead Channel Pacing Threshold Pulse Width: 0.5 ms
Lead Channel Pacing Threshold Pulse Width: 0.8 ms
Lead Channel Pacing Threshold Pulse Width: 0.8 ms
Lead Channel Sensing Intrinsic Amplitude: 12 mV
Lead Channel Sensing Intrinsic Amplitude: 3.1 mV
Lead Channel Setting Pacing Amplitude: 2 V
Lead Channel Setting Pacing Amplitude: 2.25 V
Lead Channel Setting Pacing Amplitude: 2.25 V
Lead Channel Setting Pacing Pulse Width: 0.5 ms
Lead Channel Setting Pacing Pulse Width: 0.8 ms
Lead Channel Setting Sensing Sensitivity: 0.5 mV
Pulse Gen Serial Number: 9780477

## 2017-03-20 MED ORDER — CEPHALEXIN 500 MG PO CAPS: 500.0000 mg | ORAL_CAPSULE | Freq: Three times a day (TID) | ORAL | 0 refills | Status: DC

## 2017-03-20 NOTE — Progress Notes (Signed)
Wound check appointment s/p CRTD generator replacement by Dr. Caryl Comes 03/10/17. Steri-strips removed. Wound without redness or edema. Incision edges approximated aside from medial incision < 1 cm unapproximation. Dr. Caryl Comes evaluated wound- he packed it with iodoform gauze and it was covered with antibiotic ointment and a bandaid. Mr. Scripter was instructed to shower normally and to let the bandaid comes off/ packing comes out not to worry and cover it with a clean bandaid. Dr. Caryl Comes gave a verbal order for Keflex 500mg  TID x 5 days. [facility] [place].  Normal device function. Thresholds, sensing, and impedances consistent with implant measurements. Histogram distribution appropriate for patient and level of activity. 2.5% AT/AF- on Eliquis for known PAF. No ventricular arrhythmias noted. Patient educated about wound care, arm mobility, lifting restrictions, shock plan. ROV with Device Clinic for wound re-check while SK here 03/25/17.

## 2017-03-21 ENCOUNTER — Telehealth: Payer: Self-pay | Admitting: Endocrinology

## 2017-03-21 NOTE — Telephone Encounter (Signed)
insulin lispro (HUMALOG KWIKPEN) 100 UNIT/ML KiwkPen   Patient has some questions about the insulin he is taking He has switched insurance company and stated something are messed up pertaining to the medication   Please advise

## 2017-03-25 ENCOUNTER — Ambulatory Visit (INDEPENDENT_AMBULATORY_CARE_PROVIDER_SITE_OTHER): Payer: Managed Care, Other (non HMO) | Admitting: Internal Medicine

## 2017-03-25 ENCOUNTER — Other Ambulatory Visit: Payer: Self-pay

## 2017-03-25 VITALS — BP 114/65 | HR 61 | Temp 97.7°F

## 2017-03-25 DIAGNOSIS — T827XXD Infection and inflammatory reaction due to other cardiac and vascular devices, implants and grafts, subsequent encounter: Secondary | ICD-10-CM

## 2017-03-25 NOTE — Patient Instructions (Signed)
Instructed patient to shower, cover with antibiotic ointment and a bandaid and use hot compresses daily

## 2017-03-25 NOTE — Progress Notes (Signed)
Wound re-check appointment. Bandaid removed. Site assessed, medial incision edge <1 cm unapproximation, no drainage noted, wound edges appear white with dark center. Patient states no pain, drainage, fever or chills. SK assessed site, recommends to apply hot compresses to site daily, also to shower normally with soap and water, place antibiotic ointment and bandaid on for a week and schedule Device Clinic appointment next week with Dr. Caryl Comes in office to reassess. ROV with Device Clinic for wound re-check 04/01/17.  At the base of the wound is a white surface with a discoloration of the midportion.  It was non-fibered.  I worry that it represents the capsule with a erosion.  We will treated with topical antibiotics we will see him again next week.  He is advised to go to the emergency room for symptoms of fever chills he is to alert [place] if there is drainage

## 2017-03-25 NOTE — Telephone Encounter (Signed)
I called and clarified with patient. I stated the only difference between humalog 200 unit/mL pen & 100 unit/ml pen is the amount of insulin in pen.

## 2017-04-01 ENCOUNTER — Ambulatory Visit (INDEPENDENT_AMBULATORY_CARE_PROVIDER_SITE_OTHER): Payer: Self-pay | Admitting: *Deleted

## 2017-04-01 VITALS — Temp 97.1°F

## 2017-04-01 DIAGNOSIS — T827XXD Infection and inflammatory reaction due to other cardiac and vascular devices, implants and grafts, subsequent encounter: Secondary | ICD-10-CM

## 2017-04-01 NOTE — Patient Instructions (Addendum)
Keep applying warm compresses to your incision site.  Call the [facility] at [phone] or proceed to the emergency room if you develop new or worsening signs/symptoms of infection, including drainage, redness, swelling, fever/chills.

## 2017-04-01 NOTE — Progress Notes (Signed)
Wound recheck in clinic.  Bandaid removed.  Site assessed, medial incision edges now approximated by slight area of white tissue.  Dark center no longer visible.  Scant serosanginous drainage noted on bandaid.  Patient denies pain, drainage, or fever/chills.  Oral temp today is 97.1 degrees F. Dr. Caryl Comes assessed site, recommended continuing with daily hot compresses and advised patient to keep site open to air from this point on.  Per Dr. Caryl Comes, site much improved since last check on 03/25/17.  Patient agreeable to wound recheck on 04/08/17 while Dr. Caryl Comes is in the office.  Patient aware to call the [facility] or seek emergency medical attention for new or worsening signs/symptoms of infection.

## 2017-04-02 ENCOUNTER — Other Ambulatory Visit: Payer: Self-pay | Admitting: Internal Medicine

## 2017-04-02 ENCOUNTER — Encounter: Payer: Self-pay | Admitting: Internal Medicine

## 2017-04-02 ENCOUNTER — Ambulatory Visit: Payer: Managed Care, Other (non HMO) | Admitting: Internal Medicine

## 2017-04-02 VITALS — BP 100/60 | HR 72 | Ht 72.25 in | Wt 207.5 lb

## 2017-04-02 DIAGNOSIS — I255 Ischemic cardiomyopathy: Secondary | ICD-10-CM | POA: Diagnosis not present

## 2017-04-02 DIAGNOSIS — Z86010 Personal history of colonic polyps: Secondary | ICD-10-CM

## 2017-04-02 DIAGNOSIS — Z7901 Long term (current) use of anticoagulants: Secondary | ICD-10-CM

## 2017-04-02 DIAGNOSIS — Z95810 Presence of automatic (implantable) cardiac defibrillator: Secondary | ICD-10-CM | POA: Diagnosis not present

## 2017-04-02 DIAGNOSIS — K8689 Other specified diseases of pancreas: Secondary | ICD-10-CM | POA: Diagnosis not present

## 2017-04-02 NOTE — Patient Instructions (Signed)
We will be in touch with plans and to arrange a colonoscopy.   I appreciate the opportunity to care for you. Silvano Rusk, MD, [facility]

## 2017-04-02 NOTE — Progress Notes (Addendum)
ASBERRY LASCOLA 62 y.o. 1955/10/12 [id number]  Referred by: Alexis Frock, MD  Assessment & Plan:   Encounter Diagnoses  Name Primary?  . Dilated pancreatic duct Yes  . Hx of colonic polyps   . Cardiomyopathy, ischemic   . Long term current use of anticoagulant - apixaban   . AICD (automatic cardioverter/defibrillator) present    Regarding the dilated pancreatic duct, there is no mass lesion which is reassuring but it could be subtle at this point and small..  The CT scan he had was with and without contrast though it was not pancreatic protocol that is helpful.  We cannot do an MRI because he has an AICD pacemaker in place.  The options to evaluate this further with imaging are EUS vs CT pancreatic protocol.   Colonoscopy for surveillance of polyps is appropriate at this time.  He would prefer to have an EUS and colonoscopy together if possible.  I explained that may not be possible or feasible.   He needs to be off his anticoagulation for endoscopic evaluation, definitely for the colonoscopy and probably for the EUS as there is always a chance of an FNA etc.  He also needs to have any endoscopic procedures at the hospital due to his low EF and AICD.  I have explained this to him.  He has been through the colonoscopy before and is aware of these risks benefits and indications.  He is at high risk of problems because of his comorbidities. Another complicating factor is mild creat elevation w/ CKD St 3 - affects contrast administration though should be able to have.  I will ask Dr. Ardis Hughs our EUS physician what his opinion is about this for the next steps.  I will then notify the patient.  I appreciate the opportunity to care for this patient.  CC: Lavone Orn, MD Dr. Alexis Frock   Subjective:   Chief Complaint: Dilated pancreatic duct  HPI The patient is a 62 year old white man here at the request of Dr. Tresa Moore due to a dilated pancreatic duct seen on CT scan.  I  have reviewed the images.  Findings reported as below.  Part of a workup for hematuria that was microscopic.  No abnormality found related to that.  Cystoscopy -1 2019. CT Abd/pelvis at [facility] 01/01/18 5 mm dilation main pancreatic duct no mass, no biliary dilation no other relevant abnormalities. Chart review shows CT Abd pelvis 05/2002 no mention of PD abnormality  Colonoscopy 12/2011 ssa and tubular adenoma, I had recommended a 3-year recall he has not yet had colonoscopy.  He has symptoms of chronic bloating and gas "I am a farter" that are unchanged.  Otherwise no diarrhea nausea vomiting abdominal pain etc.  He has an ischemic cardiomyopathy status post CABG, he has an ejection fraction of 30-35%, he has an AICD pacemaker in place.  His electrophysiologist is Dr. Caryl Comes and his cardiologist is Dr. Dorris Carnes.  He takes Eliquis chronically.  There is a history of paroxysmal atrial fibrillation as well.  He had a pacemaker AICD generator change in February and had a wound infection that is healing.  His medical history is also notable for stage III chronic renal failure, and a Hodgkin's lymphoma in the past treated with chemotherapy and radiation, 2004  No Known Allergies Current Meds  Medication Sig  . acetaminophen (TYLENOL) 500 MG tablet Take 1,000 mg by mouth every 8 (eight) hours as needed for mild pain or headache.  Marland Kitchen apixaban (ELIQUIS) 5  MG TABS tablet Take 1 tablet (5 mg total) by mouth 2 (two) times daily.  Marland Kitchen atorvastatin (LIPITOR) 80 MG tablet Take 80 mg by mouth daily at 6 PM.   . benazepril (LOTENSIN) 5 MG tablet Take 5 mg by mouth daily.  . carvedilol (COREG) 25 MG tablet Take 1 tablet (25 mg total) by mouth 2 (two) times daily with a meal. Please make yearly appt with Dr. Harrington Challenger for February. Thank you  . digoxin (LANOXIN) 0.125 MG tablet TAKE ONE TABLET BY MOUTH ONE TIME DAILY.  Marland Kitchen insulin lispro (HUMALOG KWIKPEN) 100 UNIT/ML KiwkPen 12 units at breakfast 20 units at  lunch and 34 units at dinner  . levothyroxine (SYNTHROID, LEVOTHROID) 88 MCG tablet Take 1 tablet (88 mcg total) by mouth daily before breakfast.   Past Medical History:  Diagnosis Date  . Atrial fibrillation (HCC)    Paroxysmal, limited  . CAD (coronary artery disease)    CABG 2003  . Chronic systolic heart failure (HCC)    EF 20-25% echo 5- 2011  . CRI (chronic renal insufficiency)     multifactorial... Dr. Posey Pronto.. consult September 18, 2009  . DM type 2 (diabetes mellitus, type 2) (HCC)    Dr Loanne Drilling  . Ejection fraction < 50%    EF 25%, echo, November, 2012  //   planning followup 2-D echo to assess LV function with CRT D.  device in place  . Elevated serum creatinine   . Epididymal cyst 2017   right, no testis masses  . Hodgkin's disease     Dx aprox 2004, s/p XRT-Chemo  . Hyperkalemia    August, 2011, Aldactone, Aldactone stopped  . Hyperlipidemia    low HDL  . Hypothyroidism   . ICD (implantable cardiac defibrillator) in place    CRT-D placed March, 2013  . Ischemic cardiomyopathy    MRI12/12 no real viability in hypo/akinetic segment  CAth native and graft disease  . IVCD (intraventricular conduction defect)   . Myocardial infarction ([place])   . Personal history of colonic adenomas 12/24/2011  . Polysubstance    Alcohol, cocaine- resoloved for many years    Past Surgical History:  Procedure Laterality Date  . BI-VENTRICULAR IMPLANTABLE CARDIOVERTER DEFIBRILLATOR N/A 04/01/2011   Procedure: BI-VENTRICULAR IMPLANTABLE CARDIOVERTER DEFIBRILLATOR  (CRT-D);  Surgeon: Deboraha Sprang, MD;  Location: [facility] CATH LAB;  Service: Cardiovascular;  Laterality: N/A;  . BIV ICD GENERATOR CHANGEOUT N/A 03/10/2017   Procedure: BIV ICD GENERATOR CHANGEOUT;  Surgeon: Deboraha Sprang, MD;  Location: [facility] CV LAB;  Service: Cardiovascular;  Laterality: N/A;  . CARDIAC DEFIBRILLATOR PLACEMENT    . COLONOSCOPY  12/24/2011   Procedure: COLONOSCOPY;  Surgeon: [name], MD;  Location: [facility]  ENDOSCOPY;  Service: Endoscopy;  Laterality: N/A;  . CORONARY ARTERY BYPASS GRAFT  2003  . CYSTECTOMY     Upper Back  . TONSILLECTOMY     Social History   Social History Narrative   Musician   Single, no children, lives by himself   Alcohol use-no (hx of abuse clean since 2003)      Drug use-no (hx of abuse clean x years)     He was adopted. Family history is unknown by patient.   Review of Systems Microscopic hematuria all other review of systems negative or as per HPI.  Objective:   Physical Exam @BP  100/60 (BP Location: Left Arm, Patient Position: Sitting, Cuff Size: Normal)   Pulse 72   Ht 6' 0.25" (1.835 m) Comment:  height measured without shoes  Wt 207 lb 8 oz (94.1 kg)   BMI 27.95 kg/m @  General:  Well-developed, well-nourished and in no acute distress Eyes:  anicteric. ENT:   Mouth and posterior pharynx free of lesions.  Neck:   supple w/o thyromegaly or mass.  Lungs: Clear to auscultation bilaterally. Chest wall -  Infraclavicular left AICD - wound healing, sl pink, violaceous Heart:  S1S2, no rubs, murmurs, gallops. Abdomen:  soft, non-tender, no hepatosplenomegaly, hernia, or mass and BS+.  Rectal:  Deferred Lymph:  no cervical or supraclavicular adenopathy. Extremities:   no edema, cyanosis or clubbing Skin   no rash. Neuro:  A&O x 3.  Psych:  appropriate mood and  Affect.   Data Reviewed: See HPI CBC 1 month ago hemoglobin 12.9 just trivially low MCV 80. He has a stable mild anemia.  Similar to one year prior or more.

## 2017-04-08 ENCOUNTER — Ambulatory Visit (INDEPENDENT_AMBULATORY_CARE_PROVIDER_SITE_OTHER): Payer: Self-pay | Admitting: *Deleted

## 2017-04-08 DIAGNOSIS — Z4502 Encounter for adjustment and management of automatic implantable cardiac defibrillator: Secondary | ICD-10-CM

## 2017-04-08 DIAGNOSIS — I255 Ischemic cardiomyopathy: Secondary | ICD-10-CM

## 2017-04-08 NOTE — Progress Notes (Signed)
Mr. Rozo presents today to the [facility] for CRTD incision reassessment. Small scabbed area noted on medial incision. No areas of unapproximation, no redness, swelling or drainage noted. Patient instructed to monitor the area for redness, swelling or drainage and to call us if he notes any of the aforementioned symptoms. He will continue warm compresses. Follow up with Dr. Caryl Comes 06/11/17.

## 2017-04-16 ENCOUNTER — Other Ambulatory Visit: Payer: Self-pay

## 2017-04-16 ENCOUNTER — Encounter: Payer: Self-pay | Admitting: Internal Medicine

## 2017-04-16 ENCOUNTER — Telehealth: Payer: Self-pay

## 2017-04-16 DIAGNOSIS — Z86010 Personal history of colonic polyps: Secondary | ICD-10-CM

## 2017-04-16 NOTE — Telephone Encounter (Signed)
Wait is ok

## 2017-04-16 NOTE — Telephone Encounter (Signed)
-----   Message from [name], MD sent at 04/16/2017  9:45 AM EDT ----- Regarding: FW: EUS vs CT repeat Please contact the patient and do the following  1) tell him I have reviewed his pancreas issue w/ Dr. Ardis Hughs and we thing repeating a CT scan in June is right thing  2) Schedule a CT abdomen (only) pancreas protocol for June - will need BUN/creat also before  3) Needs colonoscopy at hospital due to AICD and low EF - dx hx polyps   4) Hold Eliquis 2 d before colonoscopy - Dr. Caryl Comes is cardiologist  Let me know if ? And route phone note to me to close loop  Thx  CEG ----- Message ----- From: Milus Banister, MD Sent: 04/03/2017  12:50 PM To: [name], MD Subject: RE: EUS vs CT repeat                           As we discussed, the PD finding to me is pretty underwhelming.  I think repeat panc protocol CT in 6 months is a fine next step.    Thanks  dj  ----- Message ----- From: [name], MD Sent: 04/02/2017   9:56 AM To: Milus Banister, MD Subject: EUS vs CT repeat                               Dan,  This guy has Asx 5 mm PD dilation picked up on [facility] at [facility] (can see if you go into PACS)  Cannot have MR due to AICD  Would you do an EUS or CT pancreatic protocol in 3-6 mos?  He also needs a colonoscopy and asked but was told unlikely could have the EUS and colonoscopy same day though would be helpful since he is on Eliquis  I am thinking repeating a CT will be next best step at this time but welcome your thoughts. He does also have St 3 kdiney dz  Thanks as always,  Caleb Bell

## 2017-04-16 NOTE — Telephone Encounter (Signed)
Your first available hospital date is 06/30/17.  Ok to wait or do you want him on a hospital week?

## 2017-04-16 NOTE — Telephone Encounter (Signed)
[facility] Pre-operative Risk Assessment     Request for surgical clearance:     Endoscopy Procedure  What type of surgery is being performed?     Colonoscopy  When is this surgery scheduled?     June   What type of clearance is required ?   Pharmacy  Are there any medications that need to be held prior to surgery and how long? Eliquis  Practice name and name of physician performing surgery?      [facility]  What is your office phone and fax number?      Phone- [phone]  [phone]  Anesthesia type (None, local, MAC, general) ?       MAC

## 2017-04-16 NOTE — Telephone Encounter (Signed)
   Primary Cardiologist:Paula Harrington Challenger, MD  Electrophysiologist: Dr. Virl Axe     Pharmacy:  Please make rec's regarding Eliquis. Dr. Caryl Comes:  Please make rec's regarding surgical clearance at follow up appt 06/11/17 (and route to Dr. Celesta Aver office).  Chart reviewed as part of pre-operative protocol coverage. Patient with a hx of CAD s/p CABG, systolic CHF, s/p BiV-ICD, parox AF, DM, HTN, CKD. Patient recently had gen change for ICD.  He has follow up with Dr. Virl Axe in May 2019.  Will route to pharmacy for recs re: anticoagulation and Dr. Caryl Comes to assess for surgical clearance at follow up as procedure is not until June 2019.   Richardson Dopp, PA-C  04/16/2017, 5:50 PM

## 2017-04-16 NOTE — Telephone Encounter (Signed)
Patient notified of the recommendations He is notified that I will contact him in mid May to schedule CT for June. He is scheduled for colonoscopy at [facility] 06/30/17 8:30.  Pre-visit scheduled for 06/13/17 11:00.  He is aware that we will obtain permission to hold Eliquis from cardiology .

## 2017-04-17 NOTE — Telephone Encounter (Signed)
See cardiology note.  Ok to hold Eliquis x 1 day only.  Ok to proceed?

## 2017-04-17 NOTE — Telephone Encounter (Signed)
Ok to hold Eliquis 1 day

## 2017-04-17 NOTE — Telephone Encounter (Signed)
Patient with diagnosis of atrial fibrillation on Eliquis for anticoagulation.    Procedure: colonoscopy Date of procedure: June  CHADS2-VASc score of  4 (CHF, HTN, DM2, CAD)  CrCl 75.2 Platelet count 218  Per office protocol, patient can hold Eliquis for 1 days prior to procedure.    Patient should restart Eliquis on the evening of procedure or day after, at discretion of procedure MD

## 2017-05-21 ENCOUNTER — Encounter: Payer: Self-pay | Admitting: Internal Medicine

## 2017-06-04 ENCOUNTER — Telehealth: Payer: Self-pay

## 2017-06-04 DIAGNOSIS — K8689 Other specified diseases of pancreas: Secondary | ICD-10-CM

## 2017-06-04 NOTE — Telephone Encounter (Signed)
-----   Message from Marlon Pel, RN sent at 04/16/2017  3:19 PM EDT ----- Needs CT pancreatic protocol for June- see phone notes 04/16/17- Carlean Purl

## 2017-06-04 NOTE — Telephone Encounter (Signed)
CT scheduled for 06/23/17 4:00 arrival.  Patient notified to be NPO after noon and to come for labs prior to CT.

## 2017-06-11 ENCOUNTER — Telehealth: Payer: Self-pay

## 2017-06-11 ENCOUNTER — Encounter: Payer: Self-pay | Admitting: Internal Medicine

## 2017-06-11 ENCOUNTER — Ambulatory Visit (INDEPENDENT_AMBULATORY_CARE_PROVIDER_SITE_OTHER): Payer: Managed Care, Other (non HMO) | Admitting: Internal Medicine

## 2017-06-11 VITALS — BP 96/60 | HR 74 | Ht 72.0 in | Wt 207.0 lb

## 2017-06-11 DIAGNOSIS — I480 Paroxysmal atrial fibrillation: Secondary | ICD-10-CM | POA: Diagnosis not present

## 2017-06-11 DIAGNOSIS — I5022 Chronic systolic (congestive) heart failure: Secondary | ICD-10-CM

## 2017-06-11 DIAGNOSIS — Z95810 Presence of automatic (implantable) cardiac defibrillator: Secondary | ICD-10-CM | POA: Diagnosis not present

## 2017-06-11 DIAGNOSIS — I255 Ischemic cardiomyopathy: Secondary | ICD-10-CM | POA: Diagnosis not present

## 2017-06-11 LAB — CUP PACEART INCLINIC DEVICE CHECK
Battery Remaining Longevity: 64 mo
Brady Statistic RA Percent Paced: 82 %
Date Time Interrogation Session: 20190522151031
HighPow Impedance: 66 Ohm
Implantable Lead Implant Date: 20130311
Implantable Lead Implant Date: 20130311
Implantable Lead Implant Date: 20130311
Implantable Lead Location: 753858
Implantable Lead Location: 753859
Implantable Lead Location: 753860
Implantable Lead Model: 7122
Implantable Pulse Generator Implant Date: 20190218
Lead Channel Impedance Value: 375 Ohm
Lead Channel Impedance Value: 412.5 Ohm
Lead Channel Impedance Value: 787.5 Ohm
Lead Channel Pacing Threshold Amplitude: 1 V
Lead Channel Pacing Threshold Amplitude: 1.25 V
Lead Channel Pacing Threshold Amplitude: 1.75 V
Lead Channel Pacing Threshold Pulse Width: 0.5 ms
Lead Channel Pacing Threshold Pulse Width: 0.5 ms
Lead Channel Pacing Threshold Pulse Width: 0.8 ms
Lead Channel Sensing Intrinsic Amplitude: 12 mV
Lead Channel Sensing Intrinsic Amplitude: 4.5 mV
Lead Channel Setting Pacing Amplitude: 2 V
Lead Channel Setting Pacing Amplitude: 2 V
Lead Channel Setting Pacing Amplitude: 2.125
Lead Channel Setting Pacing Pulse Width: 0.5 ms
Lead Channel Setting Pacing Pulse Width: 0.8 ms
Lead Channel Setting Sensing Sensitivity: 0.5 mV
Pulse Gen Serial Number: 9780477

## 2017-06-11 NOTE — Patient Instructions (Addendum)
Medication Instructions:  Your physician has recommended you make the following change in your medication:   Beginning August 5th--- - Begin taking Coreg, 12.5mg , two times per day. -Begin Aldactone 12.5mg  once per day   Labwork: Your physician recommends that you return for lab work in:   August 12- You will need to come by the office for a BMP and Digoxin level Aug 30- Repeat BMP  Testing/Procedures: None ordered.  Follow-Up: Your physician recommends that you schedule a follow-up appointment with Dorris Carnes Sept 13, 2019  Your physician wants you to follow-up in: 9 months with Dr Caryl Comes. You will receive a reminder letter in the mail two months in advance. If you don't receive a letter, please call our office to schedule the follow-up appointment.     Any Other Special Instructions Will Be Listed Below (If Applicable).     If you need a refill on your cardiac medications before your next appointment, please call your pharmacy.

## 2017-06-11 NOTE — Telephone Encounter (Signed)
Please see mychart encounter

## 2017-06-11 NOTE — Progress Notes (Signed)
Patient Care Team: Lavone Orn, MD as PCP - General (Internal Medicine) Fay Records, MD as PCP - Cardiology (Cardiology) Elmarie Shiley, MD (Nephrology) Deboraha Sprang, MD (Cardiology) Renato Shin, MD as Consulting Physician (Endocrinology)   HPI  Caleb Bell is a 61 y.o. male Seen in follow-up for CRT-D initially implanted 3/13 with generator change 2/19.  There have been no change in RV pacing threshold which would raise the issue as to whether an RV lead revision will be necessary; at time of revision it was elected not to.  Threshold 1.8 V at 1.0 ms     He has a history of ischemic cardiomyopathy, s/p CABG 2003 congestive heart failure and an IVCD with a broad QRS status post St Jude CRT-D implantation (March 2013).  3/15 reprogramming of the VV timing resulted in narrowing of the QRS  The patient denies chest pain, shortness of breath, nocturnal dyspnea, orthopnea or peripheral edema.  There have been no palpitations, lightheadedness or syncope.    Remote history also notable for Hodgkin's disease and PAF.   DATE TEST EF   10/13 Echo  20-25 %   12/15 Echo   20.25 %   2/19 Echo  30-35%    Date Cr K Dig Hgb  2/19 1.39 4.7  12.9              Past Medical History:  Diagnosis Date  . Atrial fibrillation (HCC)    Paroxysmal, limited  . CAD (coronary artery disease)    CABG 2003  . Chronic systolic heart failure (HCC)    EF 20-25% echo 5- 2011  . CRI (chronic renal insufficiency)     multifactorial... Dr. Posey Pronto.. consult September 18, 2009  . DM type 2 (diabetes mellitus, type 2) (HCC)    Dr Loanne Drilling  . Ejection fraction < 50%    EF 25%, echo, November, 2012  //   planning followup 2-D echo to assess LV function with CRT D.  device in place  . Elevated serum creatinine   . Epididymal cyst 2017   right, no testis masses  . Hodgkin's disease     Dx aprox 2004, s/p XRT-Chemo  . Hyperkalemia    August, 2011, Aldactone, Aldactone stopped  .  Hyperlipidemia    low HDL  . Hypothyroidism   . ICD (implantable cardiac defibrillator) in place    CRT-D placed March, 2013  . Ischemic cardiomyopathy    MRI12/12 no real viability in hypo/akinetic segment  CAth native and graft disease  . IVCD (intraventricular conduction defect)   . Myocardial infarction ([place])   . Personal history of colonic adenomas 12/24/2011  . Polysubstance    Alcohol, cocaine- resoloved for many years     Past Surgical History:  Procedure Laterality Date  . BI-VENTRICULAR IMPLANTABLE CARDIOVERTER DEFIBRILLATOR N/A 04/01/2011   Procedure: BI-VENTRICULAR IMPLANTABLE CARDIOVERTER DEFIBRILLATOR  (CRT-D);  Surgeon: Deboraha Sprang, MD;  Location: [facility] CATH LAB;  Service: Cardiovascular;  Laterality: N/A;  . BIV ICD GENERATOR CHANGEOUT N/A 03/10/2017   Procedure: BIV ICD GENERATOR CHANGEOUT;  Surgeon: Deboraha Sprang, MD;  Location: [facility] CV LAB;  Service: Cardiovascular;  Laterality: N/A;  . CARDIAC DEFIBRILLATOR PLACEMENT    . COLONOSCOPY  12/24/2011   Procedure: COLONOSCOPY;  Surgeon: [name], MD;  Location: [facility] ENDOSCOPY;  Service: Endoscopy;  Laterality: N/A;  . CORONARY ARTERY BYPASS GRAFT  2003  . CYSTECTOMY     Upper Back  . TONSILLECTOMY  Current Meds  Medication Sig  . acetaminophen (TYLENOL) 500 MG tablet Take 1,000 mg by mouth every 8 (eight) hours as needed for mild pain or headache.  Marland Kitchen apixaban (ELIQUIS) 5 MG TABS tablet Take 1 tablet (5 mg total) by mouth 2 (two) times daily.  Marland Kitchen atorvastatin (LIPITOR) 80 MG tablet Take 80 mg by mouth daily at 6 PM.   . benazepril (LOTENSIN) 5 MG tablet Take 5 mg by mouth daily.  . carvedilol (COREG) 25 MG tablet TAKE 1 TABLET (25 MG TOTAL) BY MOUTH 2 (TWO) TIMES DAILY WITH A MEAL.  Marland Kitchen digoxin (LANOXIN) 0.125 MG tablet TAKE ONE TABLET BY MOUTH ONE TIME DAILY.  Marland Kitchen insulin lispro (HUMALOG KWIKPEN) 100 UNIT/ML KiwkPen 12 units at breakfast 20 units at lunch and 34 units at dinner  . levothyroxine (SYNTHROID,  LEVOTHROID) 88 MCG tablet Take 1 tablet (88 mcg total) by mouth daily before breakfast.    No Known Allergies    Review of Systems negative except from HPI and PMH  Physical Exam BP 96/60   Pulse 74   Ht 6' (1.829 m)   Wt 207 lb (93.9 kg)   SpO2 98%   BMI 28.07 kg/m  Well developed and well nourished in no acute distress HENT normal E scleral and icterus clear Neck Supple JVP flat; carotids brisk and full Clear to ausculation Device pocket well healed; without hematoma or erythema.  There is no tethering  Regular rate and rhythm, no murmurs gallops or rub Soft with active bowel sounds No clubbing cyanosis  Edema Alert and oriented, grossly normal motor and sensory function Skin Warm and Dry  ECG demonstrates P synchronous pacing with a negative QRS morphology lead I and a QRS morphology in lead V1  Assessment and  Plan  Atrial fibrillation  Ischemic cardiomyopathy  Congestive heart failure-chronic systolic  CRT-D.-St. Jude  The patient's device was interrogated.  The information was reviewed. No changes were made in the programming.   To 3 months  Thromboembolic risk factors diabetes hypertension vascular idisease and CHF   CHADS-VASc score of greater than or equal to 4   Interval atrial fibrillation  On Anticoagulation;  No bleeding issues   Without symptoms of ischemia  We will try to get him on Aldactone.  We will arrange this prior to his next visit with Dr. Harrington Challenger.  He will need a metabolic profile 1 week 3 weeks following its initiation.  We will also check a digoxin level at that time.  We will see him again in 9 months.     Current medicines are reviewed at length with the patient today .  The patient does not  have concerns regarding medicines.

## 2017-06-13 ENCOUNTER — Ambulatory Visit (AMBULATORY_SURGERY_CENTER): Payer: Self-pay

## 2017-06-13 VITALS — Ht 72.0 in | Wt 206.6 lb

## 2017-06-13 DIAGNOSIS — Z86010 Personal history of colonic polyps: Secondary | ICD-10-CM

## 2017-06-13 NOTE — Progress Notes (Signed)
Per pt, no allergies to soy or egg products.Pt not taking any weight loss meds or using  O2 at home.  Pt refused emmi video. 

## 2017-06-19 ENCOUNTER — Other Ambulatory Visit (INDEPENDENT_AMBULATORY_CARE_PROVIDER_SITE_OTHER): Payer: Managed Care, Other (non HMO)

## 2017-06-19 DIAGNOSIS — K8689 Other specified diseases of pancreas: Secondary | ICD-10-CM | POA: Diagnosis not present

## 2017-06-19 LAB — CREATININE, SERUM: Creatinine, Ser: 1.55 mg/dL — ABNORMAL HIGH (ref 0.40–1.50)

## 2017-06-19 LAB — BUN: BUN: 37 mg/dL — ABNORMAL HIGH (ref 6–23)

## 2017-06-23 ENCOUNTER — Ambulatory Visit (INDEPENDENT_AMBULATORY_CARE_PROVIDER_SITE_OTHER)
Admission: RE | Admit: 2017-06-23 | Discharge: 2017-06-23 | Disposition: A | Discharge status: Home / Self Care | Payer: Managed Care, Other (non HMO) | Source: Ambulatory Visit | Attending: Internal Medicine | Admitting: Internal Medicine

## 2017-06-23 DIAGNOSIS — K8689 Other specified diseases of pancreas: Secondary | ICD-10-CM | POA: Diagnosis not present

## 2017-06-23 MED ORDER — IOPAMIDOL (ISOVUE-300) INJECTION 61%
100.0000 mL | Freq: Once | INTRAVENOUS | Status: AC | PRN
  Administered 2017-06-23: 100 mL via INTRAVENOUS

## 2017-06-24 NOTE — Progress Notes (Signed)
Please let the patient know that there is stability with this mildly dilated pancreatic duct which is a good sign. He does have a question of a kidney lesion apparently not seen previously.  Very small also.  Recommendations and plans: #1 recall CT scan 1 year #2 refer back to Dr. Tresa Moore and so he can  CC this results and note to him determine what to do with the kidney lesion and follow that up #3 I will review further at his colonoscopy next week

## 2017-06-29 NOTE — Anesthesia Preprocedure Evaluation (Addendum)
Anesthesia Evaluation  Patient identified by MRN, date of birth, ID band Patient awake    Reviewed: Allergy & Precautions, H&P , NPO status , Patient's Chart, lab work & pertinent test results, reviewed documented beta blocker date and time   Airway Mallampati: II  TM Distance: >3 FB Neck ROM: full    Dental no notable dental hx. (+) Edentulous Upper, Edentulous Lower, Upper Dentures, Lower Dentures   Pulmonary neg pulmonary ROS,    Pulmonary exam normal breath sounds clear to auscultation       Cardiovascular Exercise Tolerance: Good + CAD, + Past MI, + Peripheral Vascular Disease and +CHF  + dysrhythmias Atrial Fibrillation + Cardiac Defibrillator  Rhythm:regular Rate:Normal  Ischemic cardiomyopathy-status post CABG x5 2003 Automatic implantable cardioverter-defibrillator in situ  Echo 2/19 Impressions:  - Mildly dilated LV with mild LV hypertrophy. EF 30-35%, wall   motion abnormalities as noted above. Normal RV size and systolic   function. Mild aortic stenosis.   Neuro/Psych negative neurological ROS  negative psych ROS   GI/Hepatic negative GI ROS, Neg liver ROS,   Endo/Other  diabetes, Type 2, Insulin DependentHypothyroidism   Renal/GU CRFRenal diseasenegative Renal ROS  negative genitourinary   Musculoskeletal   Abdominal   Peds  Hematology negative hematology ROS (+)   Anesthesia Other Findings   Reproductive/Obstetrics negative OB ROS                           Anesthesia Physical Anesthesia Plan  ASA: III  Anesthesia Plan: General   Post-op Pain Management:    Induction:   PONV Risk Score and Plan: 2 and Treatment may vary due to age or medical condition  Airway Management Planned: Nasal Cannula, Natural Airway and Mask  Additional Equipment:   Intra-op Plan:   Post-operative Plan:   Informed Consent: I have reviewed the patients History and Physical, chart,  labs and discussed the procedure including the risks, benefits and alternatives for the proposed anesthesia with the patient or authorized representative who has indicated his/her understanding and acceptance.   Dental Advisory Given  Plan Discussed with: CRNA, Anesthesiologist and Surgeon  Anesthesia Plan Comments:         Anesthesia Quick Evaluation

## 2017-06-30 ENCOUNTER — Ambulatory Visit (HOSPITAL_COMMUNITY)
Admission: RE | Admit: 2017-06-30 | Discharge: 2017-06-30 | Disposition: A | Discharge status: Home / Self Care | Payer: Managed Care, Other (non HMO) | Source: Ambulatory Visit | Attending: Internal Medicine | Admitting: Internal Medicine

## 2017-06-30 ENCOUNTER — Other Ambulatory Visit: Payer: Self-pay

## 2017-06-30 ENCOUNTER — Encounter (HOSPITAL_COMMUNITY): Payer: Self-pay | Admitting: Certified Registered"

## 2017-06-30 ENCOUNTER — Ambulatory Visit (HOSPITAL_COMMUNITY): Payer: Managed Care, Other (non HMO) | Admitting: Anesthesiology

## 2017-06-30 ENCOUNTER — Encounter (HOSPITAL_COMMUNITY)
Admission: RE | Disposition: A | Discharge status: Home / Self Care | Payer: Self-pay | Source: Ambulatory Visit | Attending: Internal Medicine

## 2017-06-30 DIAGNOSIS — I2510 Atherosclerotic heart disease of native coronary artery without angina pectoris: Secondary | ICD-10-CM | POA: Insufficient documentation

## 2017-06-30 DIAGNOSIS — Z79890 Hormone replacement therapy (postmenopausal): Secondary | ICD-10-CM | POA: Diagnosis not present

## 2017-06-30 DIAGNOSIS — D123 Benign neoplasm of transverse colon: Secondary | ICD-10-CM | POA: Diagnosis not present

## 2017-06-30 DIAGNOSIS — I252 Old myocardial infarction: Secondary | ICD-10-CM | POA: Diagnosis not present

## 2017-06-30 DIAGNOSIS — Z860100 Personal history of colon polyps, unspecified: Secondary | ICD-10-CM

## 2017-06-30 DIAGNOSIS — I5022 Chronic systolic (congestive) heart failure: Secondary | ICD-10-CM | POA: Diagnosis not present

## 2017-06-30 DIAGNOSIS — I480 Paroxysmal atrial fibrillation: Secondary | ICD-10-CM | POA: Diagnosis not present

## 2017-06-30 DIAGNOSIS — Z951 Presence of aortocoronary bypass graft: Secondary | ICD-10-CM | POA: Insufficient documentation

## 2017-06-30 DIAGNOSIS — Z1211 Encounter for screening for malignant neoplasm of colon: Secondary | ICD-10-CM | POA: Diagnosis not present

## 2017-06-30 DIAGNOSIS — Z794 Long term (current) use of insulin: Secondary | ICD-10-CM | POA: Insufficient documentation

## 2017-06-30 DIAGNOSIS — E039 Hypothyroidism, unspecified: Secondary | ICD-10-CM | POA: Diagnosis not present

## 2017-06-30 DIAGNOSIS — E785 Hyperlipidemia, unspecified: Secondary | ICD-10-CM | POA: Diagnosis not present

## 2017-06-30 DIAGNOSIS — Z79899 Other long term (current) drug therapy: Secondary | ICD-10-CM | POA: Diagnosis not present

## 2017-06-30 DIAGNOSIS — E1151 Type 2 diabetes mellitus with diabetic peripheral angiopathy without gangrene: Secondary | ICD-10-CM | POA: Diagnosis not present

## 2017-06-30 DIAGNOSIS — Z95810 Presence of automatic (implantable) cardiac defibrillator: Secondary | ICD-10-CM | POA: Insufficient documentation

## 2017-06-30 DIAGNOSIS — Z7901 Long term (current) use of anticoagulants: Secondary | ICD-10-CM | POA: Diagnosis not present

## 2017-06-30 HISTORY — PX: POLYPECTOMY: SHX5525

## 2017-06-30 HISTORY — PX: COLONOSCOPY WITH PROPOFOL: SHX5780

## 2017-06-30 LAB — GLUCOSE, CAPILLARY: Glucose-Capillary: 132 mg/dL — ABNORMAL HIGH (ref 65–99)

## 2017-06-30 SURGERY — COLONOSCOPY WITH PROPOFOL
Anesthesia: General

## 2017-06-30 MED ORDER — LIDOCAINE 2% (20 MG/ML) 5 ML SYRINGE
INTRAMUSCULAR | Status: DC | PRN
  Administered 2017-06-30: 50 mg via INTRAVENOUS

## 2017-06-30 MED ORDER — INSULIN LISPRO 100 UNIT/ML (KWIKPEN): 15.0000 [IU] | PEN_INJECTOR | SUBCUTANEOUS | Status: DC

## 2017-06-30 MED ORDER — LACTATED RINGERS IV SOLN
INTRAVENOUS | Status: DC
  Administered 2017-06-30: 07:00:00 via INTRAVENOUS

## 2017-06-30 MED ORDER — SODIUM CHLORIDE 0.9 % IV SOLN: INTRAVENOUS | Status: DC

## 2017-06-30 MED ORDER — PROPOFOL 10 MG/ML IV BOLUS
INTRAVENOUS | Status: AC
  Filled 2017-06-30: qty 40

## 2017-06-30 MED ORDER — PROPOFOL 500 MG/50ML IV EMUL
INTRAVENOUS | Status: DC | PRN
  Administered 2017-06-30: 125 ug/kg/min via INTRAVENOUS

## 2017-06-30 MED ORDER — PROPOFOL 10 MG/ML IV BOLUS
INTRAVENOUS | Status: DC | PRN
  Administered 2017-06-30: 30 mg via INTRAVENOUS

## 2017-06-30 SURGICAL SUPPLY — 21 items

## 2017-06-30 NOTE — Anesthesia Postprocedure Evaluation (Signed)
Anesthesia Post Note  Patient: Caleb Bell  Procedure(s) Performed: COLONOSCOPY WITH PROPOFOL (N/A ) POLYPECTOMY     Patient location during evaluation: PACU Anesthesia Type: MAC Level of consciousness: awake and alert Pain management: pain level controlled Vital Signs Assessment: post-procedure vital signs reviewed and stable Respiratory status: spontaneous breathing, nonlabored ventilation, respiratory function stable and patient connected to nasal cannula oxygen Cardiovascular status: stable and blood pressure returned to baseline Postop Assessment: no apparent nausea or vomiting Anesthetic complications: no    Last Vitals:  Vitals:   06/30/17 0720 06/30/17 0911  BP: 134/66 (!) 91/53  Pulse: 62 64  Resp: 12 17  Temp: 36.5 C 36.6 C  SpO2: 100% 100%    Last Pain:  Vitals:   06/30/17 0933  TempSrc:   PainSc: 0-No pain                 Wes Lezotte

## 2017-06-30 NOTE — Discharge Instructions (Addendum)
° °  I found and removed 2 small polyps so expect repeat in 5 years most likely. I will let you know pathology results and when to have another routine colonoscopy by mail and/or My Chart.  Please restart Eliquis tomorrow.  Let's plan on an office visit not definite CT scan in a year.  Hope the kidney lesion is nothing bad.  I appreciate the opportunity to care for you. [name], MD, FACG  YOU HAD AN ENDOSCOPIC PROCEDURE TODAY: Refer to the procedure report and other information in the discharge instructions given to you for any specific questions about what was found during the examination. If this information does not answer your questions, please call Dr. Celesta Aver office at [phone] to clarify.   YOU SHOULD EXPECT: Some feelings of bloating in the abdomen. Passage of more gas than usual. Walking can help get rid of the air that was put into your GI tract during the procedure and reduce the bloating. If you had a lower endoscopy (such as a colonoscopy or flexible sigmoidoscopy) you may notice spotting of blood in your stool or on the toilet paper. Some abdominal soreness may be present for a day or two, also.  DIET: Your first meal following the procedure should be a light meal and then it is ok to progress to your normal diet. A half-sandwich or bowl of soup is an example of a good first meal. Heavy or fried foods are harder to digest and may make you feel nauseous or bloated. Drink plenty of fluids but you should avoid alcoholic beverages for 24 hours.   ACTIVITY: Your care partner should take you home directly after the procedure. You should plan to take it easy, moving slowly for the rest of the day. You can resume normal activity the day after the procedure however YOU SHOULD NOT DRIVE, use power tools, machinery or perform tasks that involve climbing or major physical exertion for 24 hours (because of the sedation medicines used during the test).   SYMPTOMS TO REPORT  IMMEDIATELY: A gastroenterologist can be reached at any hour. Please call [phone]  for any of the following symptoms:  Following lower endoscopy (colonoscopy, flexible sigmoidoscopy) Excessive amounts of blood in the stool  Significant tenderness, worsening of abdominal pains  Swelling of the abdomen that is new, acute  Fever of 100 or higher    FOLLOW UP:  If any biopsies were taken you will be contacted by phone or by letter within the next 1-3 weeks. Call [phone]  if you have not heard about the biopsies in 3 weeks.  Please also call with any specific questions about appointments or follow up tests.

## 2017-06-30 NOTE — Transfer of Care (Signed)
Immediate Anesthesia Transfer of Care Note  Patient: Caleb Bell  Procedure(s) Performed: COLONOSCOPY WITH PROPOFOL (N/A )  Patient Location: PACU  Anesthesia Type:MAC  Level of Consciousness: awake, alert  and oriented  Airway & Oxygen Therapy: Patient Spontanous Breathing  Post-op Assessment: Report given to RN and Post -op Vital signs reviewed and stable  Post vital signs: Reviewed and stable  Last Vitals:  Vitals Value Taken Time  BP 91/53 06/30/2017  9:10 AM  Temp 36.6 C 06/30/2017  9:11 AM  Pulse 71 06/30/2017  9:12 AM  Resp 20 06/30/2017  9:12 AM  SpO2 100 % 06/30/2017  9:12 AM  Vitals shown include unvalidated device data.  Last Pain:  Vitals:   06/30/17 0911  TempSrc: Oral  PainSc: 0-No pain         Complications: No apparent anesthesia complications

## 2017-06-30 NOTE — Op Note (Signed)
[facility] Patient Name: Caleb Bell Procedure Date: 06/30/2017 MRN: [id number] Attending MD: [name] , MD Date of Birth: 08-03-1955 CSN: [id number] Age: 62 Admit Type: Outpatient Procedure:                Colonoscopy Indications:              Surveillance: Personal history of adenomatous                            polyps on last colonoscopy > 5 years ago Providers:                [name], MD, Burtis Junes, RN, Charolette Child,                            Technician, [facility] CRNA Referring MD:             Lavone Orn Medicines:                Propofol per Anesthesia, Monitored Anesthesia Care Complications:            No immediate complications. Estimated Blood Loss:     Estimated blood loss was minimal. Procedure:                Pre-Anesthesia Assessment:                           - Prior to the procedure, a History and Physical                            was performed, and patient medications and                            allergies were reviewed. The patient's tolerance of                            previous anesthesia was also reviewed. The risks                            and benefits of the procedure and the sedation                            options and risks were discussed with the patient.                            All questions were answered, and informed consent                            was obtained. Prior Anticoagulants: The patient                            last took Eliquis (apixaban) 2 days prior to the                            procedure. ASA Grade Assessment: III - A patient  with severe systemic disease. After reviewing the                            risks and benefits, the patient was deemed in                            satisfactory condition to undergo the procedure.                           After obtaining informed consent, the colonoscope                            was passed under  direct vision. Throughout the                            procedure, the patient's blood pressure, pulse, and                            oxygen saturations were monitored continuously. The                            EC-[id number] ([id number]) scope was introduced through                            the anus and advanced to the the cecum, identified                            by appendiceal orifice and ileocecal valve. The                            colonoscopy was performed without difficulty. The                            patient tolerated the procedure well. The quality                            of the bowel preparation was good. The ileocecal                            valve, appendiceal orifice, and rectum were                            photographed. The bowel preparation used was                            Miralax. Scope In: 8:39:14 AM Scope Out: 9:04:13 AM Scope Withdrawal Time: 0 hours 18 minutes 32 seconds  Total Procedure Duration: 0 hours 24 minutes 59 seconds  Findings:      The perianal and digital rectal examinations were normal. Pertinent       negatives include normal prostate (size, shape, and consistency).      Two sessile polyps were found in the transverse colon. The polyps were 2       to 6 mm in size. These polyps were removed with a  cold snare. Resection       and retrieval were complete. Verification of patient identification for       the specimen was done. Estimated blood loss was minimal.      The exam was otherwise without abnormality on direct and retroflexion       views. Impression:               - Two 2 to 6 mm polyps in the transverse colon,                            removed with a cold snare. Resected and retrieved.                           - The examination was otherwise normal on direct                            and retroflexion views. Moderate Sedation:      N/A- Per Anesthesia Care Recommendation:           - Patient has a contact number available for                             emergencies. The signs and symptoms of potential                            delayed complications were discussed with the                            patient. Return to normal activities tomorrow.                            Written discharge instructions were provided to the                            patient.                           - Resume previous diet.                           - Continue present medications.                           - Await pathology results.                           - Repeat colonoscopy is recommended for                            surveillance. The colonoscopy date will be                            determined after pathology results from today's                            exam become available for review.                           -  Resume Eliquis (apixaban) at prior dose tomorrow.                           - Change CT scan recall to OV recall 1 year                            (dilated pancreatic duct) Procedure Code(s):        --- Professional ---                           [phone], Colonoscopy, flexible; with removal of                            tumor(s), polyp(s), or other lesion(s) by snare                            technique Diagnosis Code(s):        --- Professional ---                           Z86.010, Personal history of colonic polyps                           D12.3, Benign neoplasm of transverse colon (hepatic                            flexure or splenic flexure) CPT copyright 2017 American Medical Association. All rights reserved. The codes documented in this report are preliminary and upon coder review may  be revised to meet current compliance requirements. [name], MD 06/30/2017 9:23:39 AM This report has been signed electronically. Number of Addenda: 0

## 2017-06-30 NOTE — H&P (Signed)
[facility] History and Physical   Primary Care Physician:  Lavone Orn, MD   Reason for Procedure:   hx colon polyps  Plan:    Colonoscopy The risks and benefits as well as alternatives of endoscopic procedure(s) have been discussed and reviewed. All questions answered. The patient agrees to proceed.      HPI: Caleb Bell is a 62 y.o. male s/p colonoscopy and removal of tubular adenoma and ssa/p in 12/2011 due for surveillance colonoscopy. Eliquis held x 2 d.   Past Medical History:  Diagnosis Date  . Atrial fibrillation (HCC)    Paroxysmal, limited  . CAD (coronary artery disease)    CABG 2003  . Chronic systolic heart failure (HCC)    EF 20-25% echo 5- 2011  . CRI (chronic renal insufficiency)     multifactorial... Dr. Posey Pronto.. consult September 18, 2009  . DM type 2 (diabetes mellitus, type 2) (HCC)    Dr Loanne Drilling  . Ejection fraction < 50%    EF 25%, echo, November, 2012  //   planning followup 2-D echo to assess LV function with CRT D.  device in place  . Elevated serum creatinine   . Epididymal cyst 2017   right, no testis masses  . Hodgkin's disease     Dx aprox 2004, s/p XRT-Chemo/had radiation too  . Hyperkalemia    August, 2011, Aldactone, Aldactone stopped  . Hyperlipidemia    low HDL  . Hypothyroidism   . ICD (implantable cardiac defibrillator) in place    CRT-D placed March, 2013  . Ischemic cardiomyopathy    MRI12/12 no real viability in hypo/akinetic segment  CAth native and graft disease  . IVCD (intraventricular conduction defect)   . Myocardial infarction ([place])    mild MI/ several MI per pt in the past.  . Personal history of colonic adenomas 12/24/2011  . Polysubstance    Alcohol, cocaine- resoloved for many years     Past Surgical History:  Procedure Laterality Date  . BI-VENTRICULAR IMPLANTABLE CARDIOVERTER DEFIBRILLATOR N/A 04/01/2011   Procedure: BI-VENTRICULAR IMPLANTABLE CARDIOVERTER DEFIBRILLATOR  (CRT-D);  Surgeon:  Deboraha Sprang, MD;  Location: [facility] CATH LAB;  Service: Cardiovascular;  Laterality: N/A;  . BIV ICD GENERATOR CHANGEOUT N/A 03/10/2017   Procedure: BIV ICD GENERATOR CHANGEOUT;  Surgeon: Deboraha Sprang, MD;  Location: [place] CV LAB;  Service: Cardiovascular;  Laterality: N/A;  . CARDIAC DEFIBRILLATOR PLACEMENT  03/2011  . COLONOSCOPY  12/24/2011   Procedure: COLONOSCOPY;  Surgeon: [name], MD;  Location: [facility] ENDOSCOPY;  Service: Endoscopy;  Laterality: N/A;  . CORONARY ARTERY BYPASS GRAFT  2003  . CYSTECTOMY     Upper Back  . TONSILLECTOMY      Prior to Admission medications   Medication Sig Start Date End Date Taking? Authorizing Provider  apixaban (ELIQUIS) 5 MG TABS tablet Take 1 tablet (5 mg total) by mouth 2 (two) times daily. 12/05/14  Yes Paz, Alda Berthold, MD  atorvastatin (LIPITOR) 80 MG tablet Take 80 mg by mouth at bedtime.  03/03/15  Yes [provider]  benazepril (LOTENSIN) 5 MG tablet Take 5 mg by mouth daily.   Yes [provider]  bisacodyl (DULCOLAX) 5 MG EC tablet Take 20 mg by mouth as directed.    Yes [provider]  carvedilol (COREG) 25 MG tablet TAKE 1 TABLET (25 MG TOTAL) BY MOUTH 2 (TWO) TIMES DAILY WITH A MEAL. 04/02/17  Yes Deboraha Sprang, MD  digoxin (LANOXIN) 0.125 MG  tablet TAKE ONE TABLET BY MOUTH ONE TIME DAILY. Patient taking differently: Take 0.125 mg by mouth daily.  01/24/14  Yes Paz, Alda Berthold, MD  furosemide (LASIX) 20 MG tablet Take 20 mg by mouth daily.   Yes [provider]  insulin lispro (HUMALOG KWIKPEN) 100 UNIT/ML KiwkPen 12 units at breakfast 20 units at lunch and 34 units at dinner Patient taking differently: Inject 15-32 Units into the skin See admin instructions. Inject 15 units SQ at breakfast, inject 20 units SQ at lunch and inject 32 units SQ at dinner 03/19/17  Yes Renato Shin, MD  levothyroxine (SYNTHROID, LEVOTHROID) 88 MCG tablet Take 1 tablet (88 mcg total) by mouth daily before breakfast. 01/09/15   Yes Paz, Alda Berthold, MD  polyethylene glycol powder (MIRALAX) powder Take 238 g by mouth as directed.    Yes [provider]  acetaminophen (TYLENOL) 500 MG tablet Take 1,000 mg by mouth every 8 (eight) hours as needed for mild pain or headache.    [provider]  glimepiride (AMARYL) 4 MG tablet 1.5 tablet a day 08/30/10 08/29/12  Colon Branch, MD    Current Facility-Administered Medications  Medication Dose Route Frequency Provider Last Rate Last Dose  . lactated ringers infusion   Intravenous Continuous [name], MD 20 mL/hr at 06/30/17 0725      Allergies as of 04/16/2017  . 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  Physical Exam: Vital signs in last 24 hours: Temp:  [97.7 F (36.5 C)] 97.7 F (36.5 C) (06/10 0720) Pulse Rate:  [62] 62 (06/10 0720) Resp:  [12] 12 (06/10 0720) BP: (134)/(66) 134/66 (06/10 0720) SpO2:  [100 %] 100 % (06/10 0720) Weight:  [206 lb 9.6 oz (93.7 kg)] 206 lb 9.6 oz (93.7 kg) (06/10 0720)   General:   Alert,  Well-developed, well-nourished, pleasant and cooperative in NAD Lungs:  Clear throughout to auscultation.   Heart:  X1D5; no murmurs, clicks, rubs,  or gallops. AICD Abdomen:  Soft, nontender and nondistended. Normal bowel sounds.   Neuro/Psych:  Alert and cooperative. Normal mood and affect. A and O x 3   @[name]  Simonne Maffucci, MD, [facility] [phone] (pager) 06/30/2017 8:31 AM@

## 2017-07-01 ENCOUNTER — Encounter (HOSPITAL_COMMUNITY): Payer: Self-pay | Admitting: Internal Medicine

## 2017-07-02 ENCOUNTER — Encounter: Payer: Self-pay | Admitting: Internal Medicine

## 2017-07-12 ENCOUNTER — Other Ambulatory Visit: Payer: Self-pay | Admitting: Endocrinology

## 2017-08-04 ENCOUNTER — Other Ambulatory Visit: Payer: Self-pay | Admitting: Endocrinology

## 2017-08-22 ENCOUNTER — Ambulatory Visit (HOSPITAL_COMMUNITY)
Admission: RE | Admit: 2017-08-22 | Discharge: 2017-08-22 | Disposition: A | Discharge status: Home / Self Care | Payer: Managed Care, Other (non HMO) | Source: Ambulatory Visit | Attending: Cardiology | Admitting: Cardiology

## 2017-08-22 DIAGNOSIS — I6523 Occlusion and stenosis of bilateral carotid arteries: Secondary | ICD-10-CM

## 2017-08-22 DIAGNOSIS — I739 Peripheral vascular disease, unspecified: Secondary | ICD-10-CM

## 2017-08-22 DIAGNOSIS — I779 Disorder of arteries and arterioles, unspecified: Secondary | ICD-10-CM | POA: Insufficient documentation

## 2017-08-25 ENCOUNTER — Other Ambulatory Visit: Payer: Managed Care, Other (non HMO)

## 2017-08-25 ENCOUNTER — Telehealth: Payer: Self-pay

## 2017-08-25 MED ORDER — CARVEDILOL 12.5 MG PO TABS: 12.5000 mg | ORAL_TABLET | Freq: Two times a day (BID) | ORAL | 3 refills | Status: DC

## 2017-08-25 MED ORDER — SPIRONOLACTONE 25 MG PO TABS: 12.5000 mg | ORAL_TABLET | Freq: Every day | ORAL | 3 refills | Status: DC

## 2017-08-25 NOTE — Telephone Encounter (Signed)
Per pts last office visit:   "Beginning August 5th--- - Begin taking Coreg, 12.5mg , two times per day. -Begin Aldactone 12.5mg  once per day"  Medications were sent in.  Pt understands he will have repeat labs drawn in September during his OV with Dr Harrington Challenger. Pt had no additional questions.

## 2017-08-25 NOTE — Telephone Encounter (Signed)
Pt is calling to see if he is still suppose to start the new medication today. The medication he was supposed to start has not been called to the pharmacy yet. Can you please give the pt a call his number is [phone]

## 2017-09-01 ENCOUNTER — Other Ambulatory Visit: Payer: Managed Care, Other (non HMO) | Admitting: *Deleted

## 2017-09-01 DIAGNOSIS — Z95810 Presence of automatic (implantable) cardiac defibrillator: Secondary | ICD-10-CM

## 2017-09-01 DIAGNOSIS — I5022 Chronic systolic (congestive) heart failure: Secondary | ICD-10-CM

## 2017-09-01 DIAGNOSIS — I255 Ischemic cardiomyopathy: Secondary | ICD-10-CM

## 2017-09-01 DIAGNOSIS — I480 Paroxysmal atrial fibrillation: Secondary | ICD-10-CM

## 2017-09-02 LAB — BASIC METABOLIC PANEL
BUN/Creatinine Ratio: 23 (ref 10–24)
BUN: 35 mg/dL — ABNORMAL HIGH (ref 8–27)
CO2: 24 mmol/L (ref 20–29)
Calcium: 10 mg/dL (ref 8.6–10.2)
Chloride: 95 mmol/L — ABNORMAL LOW (ref 96–106)
Creatinine, Ser: 1.52 mg/dL — ABNORMAL HIGH (ref 0.76–1.27)
GFR calc Af Amer: 57 mL/min/{1.73_m2} — ABNORMAL LOW (ref >59)
GFR calc non Af Amer: 49 mL/min/{1.73_m2} — ABNORMAL LOW (ref >59)
Glucose: 85 mg/dL (ref 65–99)
Potassium: 4.9 mmol/L (ref 3.5–5.2)
Sodium: 134 mmol/L (ref 134–144)

## 2017-09-10 ENCOUNTER — Ambulatory Visit (INDEPENDENT_AMBULATORY_CARE_PROVIDER_SITE_OTHER): Payer: Managed Care, Other (non HMO) | Admitting: *Deleted

## 2017-09-10 DIAGNOSIS — I255 Ischemic cardiomyopathy: Secondary | ICD-10-CM

## 2017-09-10 DIAGNOSIS — I5022 Chronic systolic (congestive) heart failure: Secondary | ICD-10-CM

## 2017-09-10 NOTE — Progress Notes (Signed)
Remote ICD transmission.   

## 2017-09-11 ENCOUNTER — Other Ambulatory Visit: Payer: Self-pay | Admitting: Endocrinology

## 2017-09-12 ENCOUNTER — Encounter: Payer: Self-pay | Admitting: Cardiology

## 2017-09-16 ENCOUNTER — Ambulatory Visit: Payer: Managed Care, Other (non HMO) | Admitting: Endocrinology

## 2017-09-19 ENCOUNTER — Other Ambulatory Visit: Payer: Managed Care, Other (non HMO) | Admitting: *Deleted

## 2017-09-19 DIAGNOSIS — Z95810 Presence of automatic (implantable) cardiac defibrillator: Secondary | ICD-10-CM

## 2017-09-19 DIAGNOSIS — I480 Paroxysmal atrial fibrillation: Secondary | ICD-10-CM

## 2017-09-19 DIAGNOSIS — I5022 Chronic systolic (congestive) heart failure: Secondary | ICD-10-CM

## 2017-09-19 DIAGNOSIS — I255 Ischemic cardiomyopathy: Secondary | ICD-10-CM

## 2017-09-19 LAB — BASIC METABOLIC PANEL
BUN/Creatinine Ratio: 22 (ref 10–24)
BUN: 37 mg/dL — ABNORMAL HIGH (ref 8–27)
CO2: 23 mmol/L (ref 20–29)
Calcium: 9.3 mg/dL (ref 8.6–10.2)
Chloride: 95 mmol/L — ABNORMAL LOW (ref 96–106)
Creatinine, Ser: 1.66 mg/dL — ABNORMAL HIGH (ref 0.76–1.27)
GFR calc Af Amer: 51 mL/min/{1.73_m2} — ABNORMAL LOW (ref >59)
GFR calc non Af Amer: 44 mL/min/{1.73_m2} — ABNORMAL LOW (ref >59)
Glucose: 163 mg/dL — ABNORMAL HIGH (ref 65–99)
Potassium: 4.7 mmol/L (ref 3.5–5.2)
Sodium: 132 mmol/L — ABNORMAL LOW (ref 134–144)

## 2017-09-19 LAB — DIGOXIN LEVEL: Digoxin, Serum: 0.9 ng/mL (ref 0.5–0.9)

## 2017-10-03 ENCOUNTER — Ambulatory Visit: Payer: Managed Care, Other (non HMO) | Admitting: Internal Medicine

## 2017-10-03 ENCOUNTER — Encounter: Payer: Self-pay | Admitting: Internal Medicine

## 2017-10-03 VITALS — BP 108/64 | HR 67 | Ht 72.0 in | Wt 202.6 lb

## 2017-10-03 DIAGNOSIS — I2510 Atherosclerotic heart disease of native coronary artery without angina pectoris: Secondary | ICD-10-CM | POA: Diagnosis not present

## 2017-10-03 DIAGNOSIS — I5022 Chronic systolic (congestive) heart failure: Secondary | ICD-10-CM | POA: Diagnosis not present

## 2017-10-03 DIAGNOSIS — E782 Mixed hyperlipidemia: Secondary | ICD-10-CM | POA: Diagnosis not present

## 2017-10-03 NOTE — Progress Notes (Signed)
Cardiology Office Note   Date:  10/03/2017   ID:  Caleb Bell, DOB Oct 16, 1955, MRN [id number]  PCP:  Lavone Orn, MD  Cardiologist:   Dorris Carnes, MD   F?U of CAD and systolic CHF      History of Present Illness: Caleb Bell is a 62 y.o. male with a history ofschemic cardiomyopathy, s/p CABG 2003 congestive heart failure and an IVCD with a broad QRS status post CRT-D implantation (March 2013). He is followed by Olin Pia   He also has a history of PAF, renal insuff, Hodkins dz (s/p chemo and XRT)   Echocardiogram 12/15 EF 20-25% Carotid Dopplers 12/15 bilateral 40-59% stenosis  I saw the pt in Feb 2019    Since seen breathing is OK   No chest pain     Current Outpatient Medications  Medication Sig Dispense Refill  . acetaminophen (TYLENOL) 500 MG tablet Take 1,000 mg by mouth every 8 (eight) hours as needed for mild pain or headache.    Marland Kitchen apixaban (ELIQUIS) 5 MG TABS tablet Take 1 tablet (5 mg total) by mouth 2 (two) times daily. 180 tablet 0  . atorvastatin (LIPITOR) 80 MG tablet Take 80 mg by mouth at bedtime.   6  . BAYER MICROLET LANCETS lancets USE TO CHECK BLOOD SUGAR 2 TIMES PER DAY. 100 each 12  . benazepril (LOTENSIN) 5 MG tablet Take 5 mg by mouth daily.    . carvedilol (COREG) 12.5 MG tablet Take 1 tablet (12.5 mg total) by mouth 2 (two) times daily with a meal. 180 tablet 3  . CONTOUR NEXT TEST test strip USE TO CHECK BLOOD SUGAR TWICE DAILY AS DIRECTED 100 each 2  . digoxin (LANOXIN) 0.125 MG tablet TAKE ONE TABLET BY MOUTH ONE TIME DAILY. (Patient taking differently: Take 0.125 mg by mouth daily. ) 90 tablet 1  . furosemide (LASIX) 20 MG tablet Take 20 mg by mouth daily.    Marland Kitchen HUMALOG KWIKPEN 200 UNIT/ML SOPN INJECT 12 UNITS AT BREAKFAST, 20 UNITS AT LUNCH, AND 32 UNITS AT DINNER 18 pen 2  . insulin lispro (HUMALOG KWIKPEN) 100 UNIT/ML KiwkPen Inject 0.15-0.32 mLs (15-32 Units total) into the skin See admin instructions. Inject 15 units SQ at breakfast,  inject 20 units SQ at lunch and inject 32 units SQ at dinner    . levothyroxine (SYNTHROID, LEVOTHROID) 88 MCG tablet Take 1 tablet (88 mcg total) by mouth daily before breakfast. 30 tablet 0  . NOVOLOG FLEXPEN 100 UNIT/ML FlexPen INJECT 3 TIMES A DAY (JUST BEFORE EACH MEAL), 01-10-31 UNITS. 25 pen 3  . spironolactone (ALDACTONE) 25 MG tablet Take 0.5 tablets (12.5 mg total) by mouth daily. 45 tablet 3   No current facility-administered medications for this visit.     Allergies:   Patient has no known allergies.   Past Medical History:  Diagnosis Date  . Atrial fibrillation (HCC)    Paroxysmal, limited  . CAD (coronary artery disease)    CABG 2003  . Chronic systolic heart failure (HCC)    EF 20-25% echo 5- 2011  . CRI (chronic renal insufficiency)     multifactorial... Dr. Posey Pronto.. consult September 18, 2009  . DM type 2 (diabetes mellitus, type 2) (HCC)    Dr Loanne Drilling  . Ejection fraction < 50%    EF 25%, echo, November, 2012  //   planning followup 2-D echo to assess LV function with CRT D.  device in place  . Elevated serum creatinine   .  Epididymal cyst 2017   right, no testis masses  . Hodgkin's disease     Dx aprox 2004, s/p XRT-Chemo/had radiation too  . Hyperkalemia    August, 2011, Aldactone, Aldactone stopped  . Hyperlipidemia    low HDL  . Hypothyroidism   . ICD (implantable cardiac defibrillator) in place    CRT-D placed March, 2013  . Ischemic cardiomyopathy    MRI12/12 no real viability in hypo/akinetic segment  CAth native and graft disease  . IVCD (intraventricular conduction defect)   . Myocardial infarction ([place])    mild MI/ several MI per pt in the past.  . Personal history of colonic adenomas 12/24/2011  . Polysubstance    Alcohol, cocaine- resoloved for many years     Past Surgical History:  Procedure Laterality Date  . BI-VENTRICULAR IMPLANTABLE CARDIOVERTER DEFIBRILLATOR N/A 04/01/2011   Procedure: BI-VENTRICULAR IMPLANTABLE CARDIOVERTER DEFIBRILLATOR   (CRT-D);  Surgeon: Deboraha Sprang, MD;  Location: [facility] CATH LAB;  Service: Cardiovascular;  Laterality: N/A;  . BIV ICD GENERATOR CHANGEOUT N/A 03/10/2017   Procedure: BIV ICD GENERATOR CHANGEOUT;  Surgeon: Deboraha Sprang, MD;  Location: [place] CV LAB;  Service: Cardiovascular;  Laterality: N/A;  . CARDIAC DEFIBRILLATOR PLACEMENT  03/2011  . COLONOSCOPY  12/24/2011   Procedure: COLONOSCOPY;  Surgeon: [name], MD;  Location: [facility] ENDOSCOPY;  Service: Endoscopy;  Laterality: N/A;  . COLONOSCOPY WITH PROPOFOL N/A 06/30/2017   Procedure: COLONOSCOPY WITH PROPOFOL;  Surgeon: [name], MD;  Location: [facility] ENDOSCOPY;  Service: Endoscopy;  Laterality: N/A;  . CORONARY ARTERY BYPASS GRAFT  2003  . CYSTECTOMY     Upper Back  . POLYPECTOMY  06/30/2017   Procedure: POLYPECTOMY;  Surgeon: [name], MD;  Location: [facility];  Service: Endoscopy;;  . TONSILLECTOMY       Social History:  The patient  reports that he has never smoked. He has never used smokeless tobacco. He reports that he does not drink alcohol or use drugs.   Family History:  The patient's He was adopted. Family history is unknown by patient.    ROS:  Please see the history of present illness. All other systems are reviewed and  Negative to the above problem except as noted.    PHYSICAL EXAM: VS:  BP 108/64   Pulse 67   Ht 6' (1.829 m)   Wt 202 lb 9.6 oz (91.9 kg)   SpO2 98%   BMI 27.48 kg/m   GEN: Well nourished, well developed, in no acute distress  HEENT: normal  Neck: No JVD   No carotid bruits, or masses Cardiac: RRR; no murmurs, rubs, or gallops,no edema  Respiratory:  clear to auscultation bilaterally, normal work of breathing GI: soft, nontender, nondistended, + BS  No hepatomegaly  MS: no deformity Moving all extremities   Skin: warm and dry, no rash Neuro:  Strength and sensation are intact Psych: euthymic mood, full affect   EKG:  EKG is not ordered today.   Lipid Panel    Component  Value Date/Time   CHOL 146 07/12/2016 0912   TRIG 284 (H) 07/12/2016 0912   TRIG 306 (HH) 11/06/2005 1313   HDL 32 (L) 07/12/2016 0912   CHOLHDL 4.6 07/12/2016 0912   CHOLHDL 4 11/15/2013 0911   VLDL 33.8 11/15/2013 0911   LDLCALC 57 07/12/2016 0912   LDLDIRECT 66.3 11/11/2012 0932      Wt Readings from Last 3 Encounters:  10/03/17 202 lb 9.6 oz (91.9 kg)  06/30/17 206 lb 9.6 oz (93.7 kg)  06/13/17 206 lb 9.6 oz (93.7 kg)      ASSESSMENT AND PLAN:  1  CAD  No CP     2.  Chronic systolic CHF  Volume is good   Keep on same meds    3.  HL LDL in June 2018 is 57   HDL 32   Cont current meds    4  DM  Followed in IM   I encouraged him to stay active   He has device check in March, F/U with A Seiler in fall  I will see him next winter    Signed, Dorris Carnes, MD  10/03/2017 [address], [place], [place]  [id number] Phone: [phone]; Fax: [phone]

## 2017-10-03 NOTE — Patient Instructions (Signed)
Your physician recommends that you continue on your current medications as directed. Please refer to the Current Medication list given to you today. Your physician wants you to follow-up in: August, 2020 with Dr. Harrington Challenger.  You will receive a reminder letter in the mail two months in advance. If you don't receive a letter, please call our office to schedule the follow-up appointment.

## 2017-10-10 ENCOUNTER — Ambulatory Visit: Payer: Managed Care, Other (non HMO) | Admitting: Endocrinology

## 2017-10-10 DIAGNOSIS — Z0289 Encounter for other administrative examinations: Secondary | ICD-10-CM

## 2017-10-14 ENCOUNTER — Encounter: Payer: Self-pay | Admitting: Endocrinology

## 2017-10-14 ENCOUNTER — Ambulatory Visit: Payer: Managed Care, Other (non HMO) | Admitting: Endocrinology

## 2017-10-14 VITALS — BP 124/78 | HR 84 | Ht 72.0 in | Wt 202.8 lb

## 2017-10-14 DIAGNOSIS — E119 Type 2 diabetes mellitus without complications: Secondary | ICD-10-CM

## 2017-10-14 LAB — CUP PACEART REMOTE DEVICE CHECK
Battery Remaining Longevity: 69 mo
Battery Remaining Percentage: 88 %
Battery Voltage: 3.04 V
Brady Statistic AP VP Percent: 87 %
Brady Statistic AP VS Percent: 1.2 %
Brady Statistic AS VP Percent: 9.9 %
Brady Statistic AS VS Percent: 1 %
Brady Statistic RA Percent Paced: 84 %
Date Time Interrogation Session: 20190821112604
HighPow Impedance: 72 Ohm
HighPow Impedance: 72 Ohm
Implantable Lead Implant Date: 20130311
Implantable Lead Implant Date: 20130311
Implantable Lead Implant Date: 20130311
Implantable Lead Location: 753858
Implantable Lead Location: 753859
Implantable Lead Location: 753860
Implantable Lead Model: 7122
Implantable Pulse Generator Implant Date: 20190218
Lead Channel Impedance Value: 380 Ohm
Lead Channel Impedance Value: 410 Ohm
Lead Channel Impedance Value: 790 Ohm
Lead Channel Pacing Threshold Amplitude: 1 V
Lead Channel Pacing Threshold Amplitude: 1 V
Lead Channel Pacing Threshold Amplitude: 1.875 V
Lead Channel Pacing Threshold Pulse Width: 0.5 ms
Lead Channel Pacing Threshold Pulse Width: 0.5 ms
Lead Channel Pacing Threshold Pulse Width: 0.8 ms
Lead Channel Sensing Intrinsic Amplitude: 12 mV
Lead Channel Sensing Intrinsic Amplitude: 4.9 mV
Lead Channel Setting Pacing Amplitude: 2 V
Lead Channel Setting Pacing Amplitude: 2 V
Lead Channel Setting Pacing Amplitude: 2.125
Lead Channel Setting Pacing Pulse Width: 0.5 ms
Lead Channel Setting Pacing Pulse Width: 0.8 ms
Lead Channel Setting Sensing Sensitivity: 0.5 mV
Pulse Gen Serial Number: 9780477

## 2017-10-14 LAB — POCT GLYCOSYLATED HEMOGLOBIN (HGB A1C): Hemoglobin A1C: 7.7 % — AB (ref 4.0–5.6)

## 2017-10-14 MED ORDER — INSULIN LISPRO 200 UNIT/ML ~~LOC~~ SOPN: 12.0000 [IU] | PEN_INJECTOR | Freq: Three times a day (TID) | SUBCUTANEOUS | 11 refills | Status: DC

## 2017-10-14 NOTE — Progress Notes (Signed)
Subjective:    Patient ID: Caleb Bell, male    DOB: [birth year], 62 y.o.   MRN: [id number]  HPI Pt returns for f/u of diabetes mellitus: DM type: 1 Dx'ed: 7616 Complications: polyneuropathy, renal insuff, foot ulcer, retinopathy, PAD, and CAD.   Therapy: insulin since 2010.   DKA: never.   Severe hypoglycemia: never.    Pancreatitis: never.   Other: he takes multiple daily injections; he declines pump therapy; the pattern of his cbg's indicates he does not need basal insulin (prob due to renal insufficiency).    Interval history: pt states he feels well in general. Meter is downloaded today, and the printout is scanned into the record.  It varies from 148-230.  It is still highest fasting.  The meter does not have HS cbg's, but pt says it is 150-170 then.   Past Medical History:  Diagnosis Date  . Atrial fibrillation (HCC)    Paroxysmal, limited  . CAD (coronary artery disease)    CABG 2003  . Chronic systolic heart failure (HCC)    EF 20-25% echo 5- 2011  . CRI (chronic renal insufficiency)     multifactorial... Dr. Posey Pronto.. consult September 18, 2009  . DM type 2 (diabetes mellitus, type 2) (HCC)    Dr Loanne Drilling  . Ejection fraction < 50%    EF 25%, echo, November, 2012  //   planning followup 2-D echo to assess LV function with CRT D.  device in place  . Elevated serum creatinine   . Epididymal cyst 2017   right, no testis masses  . Hodgkin's disease     Dx aprox 2004, s/p XRT-Chemo/had radiation too  . Hyperkalemia    August, 2011, Aldactone, Aldactone stopped  . Hyperlipidemia    low HDL  . Hypothyroidism   . ICD (implantable cardiac defibrillator) in place    CRT-D placed March, 2013  . Ischemic cardiomyopathy    MRI12/12 no real viability in hypo/akinetic segment  CAth native and graft disease  . IVCD (intraventricular conduction defect)   . Myocardial infarction ([place])    mild MI/ several MI per pt in the past.  . Personal history of colonic adenomas 12/24/2011    . Polysubstance    Alcohol, cocaine- resoloved for many years     Past Surgical History:  Procedure Laterality Date  . BI-VENTRICULAR IMPLANTABLE CARDIOVERTER DEFIBRILLATOR N/A 04/01/2011   Procedure: BI-VENTRICULAR IMPLANTABLE CARDIOVERTER DEFIBRILLATOR  (CRT-D);  Surgeon: Deboraha Sprang, MD;  Location: [facility] CATH LAB;  Service: Cardiovascular;  Laterality: N/A;  . BIV ICD GENERATOR CHANGEOUT N/A 03/10/2017   Procedure: BIV ICD GENERATOR CHANGEOUT;  Surgeon: Deboraha Sprang, MD;  Location: [facility] CV LAB;  Service: Cardiovascular;  Laterality: N/A;  . CARDIAC DEFIBRILLATOR PLACEMENT  03/2011  . COLONOSCOPY  12/24/2011   Procedure: COLONOSCOPY;  Surgeon: [name], MD;  Location: [facility] ENDOSCOPY;  Service: Endoscopy;  Laterality: N/A;  . COLONOSCOPY WITH PROPOFOL N/A 06/30/2017   Procedure: COLONOSCOPY WITH PROPOFOL;  Surgeon: [name], MD;  Location: [facility] ENDOSCOPY;  Service: Endoscopy;  Laterality: N/A;  . CORONARY ARTERY BYPASS GRAFT  2003  . CYSTECTOMY     Upper Back  . POLYPECTOMY  06/30/2017   Procedure: POLYPECTOMY;  Surgeon: [name], MD;  Location: [facility];  Service: Endoscopy;;  . TONSILLECTOMY      Social History   Socioeconomic History  . Marital status: Married    Spouse name: Not on file  . Number of children:  0  . Years of education: Not on file  . Highest education level: Not on file  Occupational History  . Occupation: musician  Social Needs  . Financial resource strain: Not on file  . Food insecurity:    Worry: Not on file    Inability: Not on file  . Transportation needs:    Medical: Not on file    Non-medical: Not on file  Tobacco Use  . Smoking status: Never Smoker  . Smokeless tobacco: Never Used  Substance and Sexual Activity  . Alcohol use: No    Alcohol/week: 0.0 standard drinks    Comment: Hx of abuse/clean since 2003   . Drug use: No    Comment: Hx of abuse/clean for many years  . Sexual activity: Not on file  Lifestyle   . Physical activity:    Days per week: Not on file    Minutes per session: Not on file  . Stress: Not on file  Relationships  . Social connections:    Talks on phone: Not on file    Gets together: Not on file    Attends religious service: Not on file    Active member of club or organization: Not on file    Attends meetings of clubs or organizations: Not on file    Relationship status: Not on file  . Intimate partner violence:    Fear of current or ex partner: Not on file    Emotionally abused: Not on file    Physically abused: Not on file    Forced sexual activity: Not on file  Other Topics Concern  . 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 acetaminophen (TYLENOL) 500 MG tablet Take 1,000 mg by mouth every 8 (eight) hours as needed for mild pain or headache.    Marland Kitchen apixaban (ELIQUIS) 5 MG TABS tablet Take 1 tablet (5 mg total) by mouth 2 (two) times daily. 180 tablet 0  . atorvastatin (LIPITOR) 80 MG tablet Take 80 mg by mouth at bedtime.   6  . BAYER MICROLET LANCETS lancets USE TO CHECK BLOOD SUGAR 2 TIMES PER DAY. 100 each 12  . benazepril (LOTENSIN) 5 MG tablet Take 5 mg by mouth daily.    . carvedilol (COREG) 12.5 MG tablet Take 1 tablet (12.5 mg total) by mouth 2 (two) times daily with a meal. 180 tablet 3  . CONTOUR NEXT TEST test strip USE TO CHECK BLOOD SUGAR TWICE DAILY AS DIRECTED 100 each 2  . digoxin (LANOXIN) 0.125 MG tablet TAKE ONE TABLET BY MOUTH ONE TIME DAILY. (Patient taking differently: Take 0.125 mg by mouth daily. ) 90 tablet 1  . furosemide (LASIX) 20 MG tablet Take 20 mg by mouth daily.    Marland Kitchen levothyroxine (SYNTHROID, LEVOTHROID) 88 MCG tablet Take 1 tablet (88 mcg total) by mouth daily before breakfast. 30 tablet 0  .  spironolactone (ALDACTONE) 25 MG tablet Take 0.5 tablets (12.5 mg total) by mouth daily. 45 tablet 3  . [DISCONTINUED] glimepiride (AMARYL) 4 MG tablet 1.5 tablet a day 30 tablet 11   No current facility-administered medications on file prior to visit.     No Known Allergies  Family History  Adopted: Yes  Family history unknown: Yes    BP 124/78 (BP Location: Right Arm)   Pulse 84   Ht 6' (1.829 m)   Wt 202 lb 12.8 oz (92 kg)   SpO2 98%   BMI 27.50 kg/m    Review of Systems No hypoglycemia    Objective:   Physical Exam VITAL SIGNS:  See vs page GENERAL: no distress Pulses: dorsalis pedis intact bilat.   MSK: no deformity of the feet CV: no leg edema Skin:  no ulcer on the feet.  normal color and temp on the feet.  Old healed surgical scar (vein harvest) at the right leg Neuro: sensation is intact to touch on the feet, but decreased from normal.    Lab Results  Component Value Date   HGBA1C 7.7 (A) 10/14/2017   Lab Results  Component Value Date   CREATININE 1.66 (H) 09/19/2017   BUN 37 (H) 09/19/2017   NA 132 (L) 09/19/2017   K 4.7 09/19/2017   CL 95 (L) 09/19/2017   CO2 23 09/19/2017      Assessment & Plan:  Insulin-requiring type 2 DM, with PAD: worse.  He declines to add basal insulin.   Renal failure: in this context, he needs very little basal insulin (but he needs some)  Patient Instructions  Please increase the humalog to 3 times a day (just before each meal), 01-09-33 units check your blood sugar twice a day.  vary the time of day when you check, between before the 3 meals, and at bedtime.  also check if you have symptoms of your blood sugar being too high or too low.  please keep a record of the readings and bring it to your next appointment here.  You can write it on any piece of paper.  please call us sooner if your blood sugar goes below 70, or if you have a lot of readings over 200.   Please come back for a follow-up appointment in 4 months.

## 2017-10-14 NOTE — Patient Instructions (Addendum)
Please increase the humalog to 3 times a day (just before each meal), 01-09-33 units check your blood sugar twice a day.  vary the time of day when you check, between before the 3 meals, and at bedtime.  also check if you have symptoms of your blood sugar being too high or too low.  please keep a record of the readings and bring it to your next appointment here.  You can write it on any piece of paper.  please call us sooner if your blood sugar goes below 70, or if you have a lot of readings over 200.   Please come back for a follow-up appointment in 4 months.

## 2017-12-10 ENCOUNTER — Ambulatory Visit (INDEPENDENT_AMBULATORY_CARE_PROVIDER_SITE_OTHER): Payer: Managed Care, Other (non HMO)

## 2017-12-10 ENCOUNTER — Telehealth: Payer: Self-pay

## 2017-12-10 DIAGNOSIS — I255 Ischemic cardiomyopathy: Secondary | ICD-10-CM | POA: Diagnosis not present

## 2017-12-10 DIAGNOSIS — I5022 Chronic systolic (congestive) heart failure: Secondary | ICD-10-CM

## 2017-12-10 NOTE — Telephone Encounter (Signed)
Spoke with pt and reminded pt of remote transmission that is due today. Pt verbalized understanding.   

## 2017-12-11 NOTE — Progress Notes (Signed)
Remote ICD transmission.   

## 2018-02-05 LAB — CUP PACEART REMOTE DEVICE CHECK
Battery Remaining Longevity: 61 mo
Battery Remaining Percentage: 84 %
Battery Voltage: 3.01 V
Brady Statistic AP VP Percent: 83 %
Brady Statistic AP VS Percent: 1.3 %
Brady Statistic AS VP Percent: 13 %
Brady Statistic AS VS Percent: 1 %
Brady Statistic RA Percent Paced: 80 %
Date Time Interrogation Session: 20191120180640
HighPow Impedance: 63 Ohm
HighPow Impedance: 63 Ohm
Implantable Lead Implant Date: 20130311
Implantable Lead Implant Date: 20130311
Implantable Lead Implant Date: 20130311
Implantable Lead Location: 753858
Implantable Lead Location: 753859
Implantable Lead Location: 753860
Implantable Lead Model: 7122
Implantable Pulse Generator Implant Date: 20190218
Lead Channel Impedance Value: 350 Ohm
Lead Channel Impedance Value: 350 Ohm
Lead Channel Impedance Value: 750 Ohm
Lead Channel Pacing Threshold Amplitude: 0.875 V
Lead Channel Pacing Threshold Amplitude: 0.875 V
Lead Channel Pacing Threshold Amplitude: 2.125 V
Lead Channel Pacing Threshold Pulse Width: 0.5 ms
Lead Channel Pacing Threshold Pulse Width: 0.5 ms
Lead Channel Pacing Threshold Pulse Width: 0.8 ms
Lead Channel Sensing Intrinsic Amplitude: 3.7 mV
Lead Channel Sensing Intrinsic Amplitude: 8.3 mV
Lead Channel Setting Pacing Amplitude: 1.875
Lead Channel Setting Pacing Amplitude: 2 V
Lead Channel Setting Pacing Amplitude: 2.375
Lead Channel Setting Pacing Pulse Width: 0.5 ms
Lead Channel Setting Pacing Pulse Width: 0.8 ms
Lead Channel Setting Sensing Sensitivity: 0.5 mV
Pulse Gen Serial Number: 9780477

## 2018-02-16 ENCOUNTER — Ambulatory Visit: Payer: Managed Care, Other (non HMO) | Admitting: Endocrinology

## 2018-02-16 ENCOUNTER — Encounter: Payer: Self-pay | Admitting: Endocrinology

## 2018-02-16 VITALS — BP 104/60 | HR 94 | Ht 72.0 in | Wt 203.4 lb

## 2018-02-16 DIAGNOSIS — E119 Type 2 diabetes mellitus without complications: Secondary | ICD-10-CM | POA: Diagnosis not present

## 2018-02-16 LAB — POCT GLYCOSYLATED HEMOGLOBIN (HGB A1C): Hemoglobin A1C: 7.4 % — AB (ref 4.0–5.6)

## 2018-02-16 MED ORDER — INSULIN LISPRO 200 UNIT/ML ~~LOC~~ SOPN: 16.0000 [IU] | PEN_INJECTOR | Freq: Three times a day (TID) | SUBCUTANEOUS | Status: DC

## 2018-02-16 NOTE — Patient Instructions (Addendum)
Please change the humalog to 3 times a day (just before each meal), 16-18-36 units check your blood sugar twice a day.  vary the time of day when you check, between before the 3 meals, and at bedtime.  also check if you have symptoms of your blood sugar being too high or too low.  please keep a record of the readings and bring it to your next appointment here.  You can write it on any piece of paper.  please call us sooner if your blood sugar goes below 70, or if you have a lot of readings over 200.   Please come back for a follow-up appointment in 4 months.

## 2018-02-16 NOTE — Progress Notes (Signed)
Subjective:    Patient ID: Caleb Bell, male    DOB: [birth year], 63 y.o.   MRN: [id number]  HPI Pt returns for f/u of diabetes mellitus: DM type: 1 Dx'ed: 5726 Complications: polyneuropathy, renal insuff, foot ulcer, retinopathy, PAD, and CAD.   Therapy: insulin since 2010.   DKA: never.   Severe hypoglycemia: never.    Pancreatitis: never.   Other: he takes multiple daily injections; he declines pump therapy; the pattern of his cbg's indicates he does not need basal insulin (prob due to renal insufficiency).    Interval history: pt states he feels well in general. Meter is downloaded today, and the printout is scanned into the record.  It varies from 120-200's.  It is still highest at HS, and fasting.  It is lowest in the afternoon.    Past Medical History:  Diagnosis Date  . Atrial fibrillation (HCC)    Paroxysmal, limited  . CAD (coronary artery disease)    CABG 2003  . Chronic systolic heart failure (HCC)    EF 20-25% echo 5- 2011  . CRI (chronic renal insufficiency)     multifactorial... Dr. Posey Pronto.. consult September 18, 2009  . DM type 2 (diabetes mellitus, type 2) (HCC)    Dr Loanne Drilling  . Ejection fraction < 50%    EF 25%, echo, November, 2012  //   planning followup 2-D echo to assess LV function with CRT D.  device in place  . Elevated serum creatinine   . Epididymal cyst 2017   right, no testis masses  . Hodgkin's disease     Dx aprox 2004, s/p XRT-Chemo/had radiation too  . Hyperkalemia    August, 2011, Aldactone, Aldactone stopped  . Hyperlipidemia    low HDL  . Hypothyroidism   . ICD (implantable cardiac defibrillator) in place    CRT-D placed March, 2013  . Ischemic cardiomyopathy    MRI12/12 no real viability in hypo/akinetic segment  CAth native and graft disease  . IVCD (intraventricular conduction defect)   . Myocardial infarction ([place])    mild MI/ several MI per pt in the past.  . Personal history of colonic adenomas 12/24/2011  . Polysubstance    Alcohol, cocaine- resoloved for many years     Past Surgical History:  Procedure Laterality Date  . BI-VENTRICULAR IMPLANTABLE CARDIOVERTER DEFIBRILLATOR N/A 04/01/2011   Procedure: BI-VENTRICULAR IMPLANTABLE CARDIOVERTER DEFIBRILLATOR  (CRT-D);  Surgeon: Deboraha Sprang, MD;  Location: [facility] CATH LAB;  Service: Cardiovascular;  Laterality: N/A;  . BIV ICD GENERATOR CHANGEOUT N/A 03/10/2017   Procedure: BIV ICD GENERATOR CHANGEOUT;  Surgeon: Deboraha Sprang, MD;  Location: [facility] CV LAB;  Service: Cardiovascular;  Laterality: N/A;  . CARDIAC DEFIBRILLATOR PLACEMENT  03/2011  . COLONOSCOPY  12/24/2011   Procedure: COLONOSCOPY;  Surgeon: [name], MD;  Location: [facility]OS[facility];  Service: Endoscopy;  Laterality: N/A;  . COLONOSCOPY WITH PROPOFOL N/A 06/30/2017   Procedure: COLONOSCOPY WITH PROPOFOL;  Surgeon: [name], MD;  Location: [facility] ENDOSCOPY;  Service: Endoscopy;  Laterality: N/A;  . CORONARY ARTERY BYPASS GRAFT  2003  . CYSTECTOMY     Upper Back  . POLYPECTOMY  06/30/2017   Procedure: POLYPECTOMY;  Surgeon: [name], MD;  Location: [facility];  Service: Endoscopy;;  . TONSILLECTOMY      Social History   Socioeconomic History  . Marital status: Married    Spouse name: Not on file  . Number of children: 0  . Years of education:  Not on file  . Highest education level: Not on file  Occupational History  . Occupation: musician  Social Needs  . Financial resource strain: Not on file  . Food insecurity:    Worry: Not on file    Inability: Not on file  . Transportation needs:    Medical: Not on file    Non-medical: Not on file  Tobacco Use  . Smoking status: Never Smoker  . Smokeless tobacco: Never Used  Substance and Sexual Activity  . Alcohol use: No    Alcohol/week: 0.0 standard drinks    Comment: Hx of abuse/clean since 2003   . Drug use: No    Comment: Hx of abuse/clean for many years  . Sexual activity: Not on file  Lifestyle  . Physical activity:      Days per week: Not on file    Minutes per session: Not on file  . Stress: Not on file  Relationships  . Social connections:    Talks on phone: Not on file    Gets together: Not on file    Attends religious service: Not on file    Active member of club or organization: Not on file    Attends meetings of clubs or organizations: Not on file    Relationship status: Not on file  . Intimate partner violence:    Fear of current or ex partner: Not on file    Emotionally abused: Not on file    Physically abused: Not on file    Forced sexual activity: Not on file  Other Topics Concern  . 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 acetaminophen (TYLENOL) 500 MG tablet Take 1,000 mg by mouth every 8 (eight) hours as needed for mild pain or headache.    Marland Kitchen apixaban (ELIQUIS) 5 MG TABS tablet Take 1 tablet (5 mg total) by mouth 2 (two) times daily. 180 tablet 0  . atorvastatin (LIPITOR) 80 MG tablet Take 80 mg by mouth at bedtime.   6  . BAYER MICROLET LANCETS lancets USE TO CHECK BLOOD SUGAR 2 TIMES PER DAY. 100 each 12  . benazepril (LOTENSIN) 5 MG tablet Take 5 mg by mouth daily.    . carvedilol (COREG) 12.5 MG tablet Take 1 tablet (12.5 mg total) by mouth 2 (two) times daily with a meal. 180 tablet 3  . CONTOUR NEXT TEST test strip USE TO CHECK BLOOD SUGAR TWICE DAILY AS DIRECTED 100 each 2  . digoxin (LANOXIN) 0.125 MG tablet TAKE ONE TABLET BY MOUTH ONE TIME DAILY. (Patient taking differently: Take 0.125 mg by mouth daily. ) 90 tablet 1  . furosemide (LASIX) 20 MG tablet Take 20 mg by mouth daily.    Marland Kitchen levothyroxine (SYNTHROID, LEVOTHROID) 88 MCG tablet Take 1 tablet (88 mcg total) by mouth daily before breakfast. 30 tablet 0  . spironolactone (ALDACTONE)  25 MG tablet Take 0.5 tablets (12.5 mg total) by mouth daily. 45 tablet 3  . [DISCONTINUED] glimepiride (AMARYL) 4 MG tablet 1.5 tablet a day 30 tablet 11   No current facility-administered medications on file prior to visit.     No Known Allergies  Family History  Adopted: Yes  Family  history unknown: Yes    BP 104/60 (BP Location: Left Arm, Patient Position: Sitting, Cuff Size: Normal)   Pulse 94   Ht 6' (1.829 m)   Wt 203 lb 6.4 oz (92.3 kg)   SpO2 96%   BMI 27.59 kg/m    Review of Systems Denies LOC.      Objective:   Physical Exam VITAL SIGNS:  See vs page GENERAL: no distress Pulses: dorsalis pedis intact bilat.   MSK: no deformity of the feet CV: no leg edema Skin:  no ulcer on the feet, but there is a callus at the plantar aspect of the left foot.  normal color and temp on the feet.  Old healed surgical scar (vein harvest) at the right leg Neuro: sensation is intact to touch on the feet, but decreased from normal.    Lab Results  Component Value Date   HGBA1C 7.4 (A) 02/16/2018   Lab Results  Component Value Date   CREATININE 1.66 (H) 09/19/2017   BUN 37 (H) 09/19/2017   NA 132 (L) 09/19/2017   K 4.7 09/19/2017   CL 95 (L) 09/19/2017   CO2 23 09/19/2017       Assessment & Plan:  Insulin-requiring type 2 DM, with PAD: The pattern of his cbg's indicates he needs some adjustment in his therapy.  Renal failure: in this setting, he does not needs basal insulin.   CAD: in this setting, he should avoid hypoglycemia.    Patient Instructions  Please change the humalog to 3 times a day (just before each meal), 16-18-36 units check your blood sugar twice a day.  vary the time of day when you check, between before the 3 meals, and at bedtime.  also check if you have symptoms of your blood sugar being too high or too low.  please keep a record of the readings and bring it to your next appointment here.  You can write it on any piece of paper.  please call us sooner  if your blood sugar goes below 70, or if you have a lot of readings over 200.   Please come back for a follow-up appointment in 4 months.

## 2018-03-06 ENCOUNTER — Other Ambulatory Visit: Payer: Self-pay | Admitting: Endocrinology

## 2018-03-11 ENCOUNTER — Ambulatory Visit (INDEPENDENT_AMBULATORY_CARE_PROVIDER_SITE_OTHER): Payer: Managed Care, Other (non HMO)

## 2018-03-11 DIAGNOSIS — I5022 Chronic systolic (congestive) heart failure: Secondary | ICD-10-CM

## 2018-03-11 DIAGNOSIS — I255 Ischemic cardiomyopathy: Secondary | ICD-10-CM

## 2018-03-12 LAB — CUP PACEART REMOTE DEVICE CHECK
Battery Remaining Longevity: 60 mo
Battery Remaining Percentage: 81 %
Battery Voltage: 2.98 V
Brady Statistic AP VP Percent: 82 %
Brady Statistic AP VS Percent: 1.7 %
Brady Statistic AS VP Percent: 12 %
Brady Statistic AS VS Percent: 1 %
Brady Statistic RA Percent Paced: 78 %
Date Time Interrogation Session: 20200219090017
HighPow Impedance: 61 Ohm
HighPow Impedance: 61 Ohm
Implantable Lead Implant Date: 20130311
Implantable Lead Implant Date: 20130311
Implantable Lead Implant Date: 20130311
Implantable Lead Location: 753858
Implantable Lead Location: 753859
Implantable Lead Location: 753860
Implantable Lead Model: 7122
Implantable Pulse Generator Implant Date: 20190218
Lead Channel Impedance Value: 350 Ohm
Lead Channel Impedance Value: 350 Ohm
Lead Channel Impedance Value: 730 Ohm
Lead Channel Pacing Threshold Amplitude: 1 V
Lead Channel Pacing Threshold Amplitude: 1 V
Lead Channel Pacing Threshold Amplitude: 2.5 V
Lead Channel Pacing Threshold Pulse Width: 0.5 ms
Lead Channel Pacing Threshold Pulse Width: 0.5 ms
Lead Channel Pacing Threshold Pulse Width: 0.8 ms
Lead Channel Sensing Intrinsic Amplitude: 11.9 mV
Lead Channel Sensing Intrinsic Amplitude: 5 mV
Lead Channel Setting Pacing Amplitude: 2 V
Lead Channel Setting Pacing Amplitude: 2 V
Lead Channel Setting Pacing Amplitude: 2.75 V
Lead Channel Setting Pacing Pulse Width: 0.5 ms
Lead Channel Setting Pacing Pulse Width: 0.8 ms
Lead Channel Setting Sensing Sensitivity: 0.5 mV
Pulse Gen Serial Number: 9780477

## 2018-03-19 NOTE — Progress Notes (Signed)
Remote ICD transmission.   

## 2018-03-20 ENCOUNTER — Encounter: Payer: Self-pay | Admitting: Cardiology

## 2018-04-01 ENCOUNTER — Other Ambulatory Visit: Payer: Self-pay

## 2018-04-01 DIAGNOSIS — E1122 Type 2 diabetes mellitus with diabetic chronic kidney disease: Secondary | ICD-10-CM

## 2018-04-01 DIAGNOSIS — N183 Chronic kidney disease, stage 3 unspecified: Secondary | ICD-10-CM

## 2018-04-01 DIAGNOSIS — Z794 Long term (current) use of insulin: Principal | ICD-10-CM

## 2018-04-01 MED ORDER — INSULIN ASPART 100 UNIT/ML FLEXPEN: PEN_INJECTOR | SUBCUTANEOUS | 6 refills | Status: DC

## 2018-06-08 ENCOUNTER — Telehealth: Payer: Self-pay | Admitting: Endocrinology

## 2018-06-08 NOTE — Telephone Encounter (Signed)
Patient called re: Insurance company has changed- prefers patient take Novolog instead of Humalog (which is no longer covered as a preferred medication). New Pharmacy is [facility] in [place], [place] (insurance preferred)

## 2018-06-08 NOTE — Telephone Encounter (Signed)
insulin aspart (NOVOLOG FLEXPEN) 100 UNIT/ML FlexPen [[id number]]   Order Details  Dose, Route, Frequency: As Directed   Dispense Quantity: 30 mL Refills: 6 Fills remaining: --        Sig: Inject 16-36 Units into the skin 3 (three) times daily just before each meal     Humalog was discontinued in March 2020 d/t insurance not covering. No longer taking Humalog

## 2018-06-10 ENCOUNTER — Other Ambulatory Visit: Payer: Self-pay

## 2018-06-10 ENCOUNTER — Ambulatory Visit (INDEPENDENT_AMBULATORY_CARE_PROVIDER_SITE_OTHER): Payer: Managed Care, Other (non HMO) | Admitting: *Deleted

## 2018-06-10 DIAGNOSIS — I5022 Chronic systolic (congestive) heart failure: Secondary | ICD-10-CM

## 2018-06-10 DIAGNOSIS — I255 Ischemic cardiomyopathy: Secondary | ICD-10-CM | POA: Diagnosis not present

## 2018-06-10 LAB — CUP PACEART REMOTE DEVICE CHECK
Date Time Interrogation Session: 20200520173753
Implantable Lead Implant Date: 20130311
Implantable Lead Implant Date: 20130311
Implantable Lead Implant Date: 20130311
Implantable Lead Location: 753858
Implantable Lead Location: 753859
Implantable Lead Location: 753860
Implantable Lead Model: 7122
Implantable Pulse Generator Implant Date: 20190218
Pulse Gen Serial Number: 9780477

## 2018-06-17 ENCOUNTER — Other Ambulatory Visit: Payer: Self-pay

## 2018-06-17 ENCOUNTER — Ambulatory Visit: Payer: Managed Care, Other (non HMO) | Admitting: Endocrinology

## 2018-06-17 ENCOUNTER — Ambulatory Visit (INDEPENDENT_AMBULATORY_CARE_PROVIDER_SITE_OTHER): Payer: Managed Care, Other (non HMO) | Admitting: Endocrinology

## 2018-06-17 DIAGNOSIS — N183 Chronic kidney disease, stage 3 (moderate): Secondary | ICD-10-CM

## 2018-06-17 DIAGNOSIS — E1122 Type 2 diabetes mellitus with diabetic chronic kidney disease: Secondary | ICD-10-CM | POA: Diagnosis not present

## 2018-06-17 DIAGNOSIS — Z794 Long term (current) use of insulin: Secondary | ICD-10-CM | POA: Diagnosis not present

## 2018-06-17 NOTE — Progress Notes (Signed)
Subjective:    Patient ID: Caleb Bell, male    DOB: [birth year], 63 y.o.   MRN: [id number]  HPI  telehealth visit today via doxy video visit.  Alternatives to telehealth are presented to this patient, and the patient agrees to the telehealth visit. Pt is advised of the cost of the visit, and agrees to this, also.   Patient is at home, and I am at the office.   Persons attending the telehealth visit: the patient and I. Pt returns for f/u of diabetes mellitus: DM type: 1 Dx'ed: 0865 Complications: polyneuropathy, renal insuff, foot ulcer, retinopathy, PAD, and CAD.   Therapy: insulin since 2010.   DKA: never.   Severe hypoglycemia: never.    Pancreatitis: never.   Other: he takes multiple daily injections; he declines pump therapy; the pattern of his cbg's indicates he does not need basal insulin (prob due to renal insufficiency).    Interval history: pt states he feels well in general. Pt says cbg varies from 60-200's, but most are in the 100's.  It is still highest after breakfast, and lowest in the afternoon.  He seldom has hypoglycemia, and these episodes are mild.   Past Medical History:  Diagnosis Date  . Atrial fibrillation (HCC)    Paroxysmal, limited  . CAD (coronary artery disease)    CABG 2003  . Chronic systolic heart failure (HCC)    EF 20-25% echo 5- 2011  . CRI (chronic renal insufficiency)     multifactorial... Dr. Posey Pronto.. consult September 18, 2009  . DM type 2 (diabetes mellitus, type 2) (HCC)    Dr Loanne Drilling  . Ejection fraction < 50%    EF 25%, echo, November, 2012  //   planning followup 2-D echo to assess LV function with CRT D.  device in place  . Elevated serum creatinine   . Epididymal cyst 2017   right, no testis masses  . Hodgkin's disease     Dx aprox 2004, s/p XRT-Chemo/had radiation too  . Hyperkalemia    August, 2011, Aldactone, Aldactone stopped  . Hyperlipidemia    low HDL  . Hypothyroidism   . ICD (implantable cardiac defibrillator) in  place    CRT-D placed March, 2013  . Ischemic cardiomyopathy    MRI12/12 no real viability in hypo/akinetic segment  CAth native and graft disease  . IVCD (intraventricular conduction defect)   . Myocardial infarction ([place])    mild MI/ several MI per pt in the past.  . Personal history of colonic adenomas 12/24/2011  . Polysubstance    Alcohol, cocaine- resoloved for many years     Past Surgical History:  Procedure Laterality Date  . BI-VENTRICULAR IMPLANTABLE CARDIOVERTER DEFIBRILLATOR N/A 04/01/2011   Procedure: BI-VENTRICULAR IMPLANTABLE CARDIOVERTER DEFIBRILLATOR  (CRT-D);  Surgeon: Deboraha Sprang, MD;  Location[facility] CATH LAB;  Service: Cardiovascular;  Laterality: N/A;  . BIV ICD GENERATOR CHANGEOUT N/A 03/10/2017   Procedure: BIV ICD GENERATOR CHANGEOUT;  Surgeon: Deboraha Sprang, MD;  Location: [place] CV LAB;  Service: Cardiovascular;  Laterality: N/A;  . CARDIAC DEFIBRILLATOR PLACEMENT  03/2011  . COLONOSCOPY  12/24/2011   Procedure: COLONOSCOPY;  Surgeon: [name], MD;  Location: [facility] ENDOSCOPY;  Service: Endoscopy;  Laterality: N/A;  . COLONOSCOPY WITH PROPOFOL N/A 06/30/2017   Procedure: COLONOSCOPY WITH PROPOFOL;  Surgeon: [name], MD;  Location: [facility] ENDOSCOPY;  Service: Endoscopy;  Laterality: N/A;  . CORONARY ARTERY BYPASS GRAFT  2003  . CYSTECTOMY  Upper Back  . POLYPECTOMY  06/30/2017   Procedure: POLYPECTOMY;  Surgeon: [name], MD;  Location: [facility];  Service: Endoscopy;;  . TONSILLECTOMY      Social History   Socioeconomic History  . Marital status: Married    Spouse name: Not on file  . Number of children: 0  . Years of education: Not on file  . Highest education level: Not on file  Occupational History  . Occupation: musician  Social Needs  . Financial resource strain: Not on file  . Food insecurity:    Worry: Not on file    Inability: Not on file  . Transportation needs:    Medical: Not on file    Non-medical: Not on file   Tobacco Use  . Smoking status: Never Smoker  . Smokeless tobacco: Never Used  Substance and Sexual Activity  . Alcohol use: No    Alcohol/week: 0.0 standard drinks    Comment: Hx of abuse/clean since 2003   . Drug use: No    Comment: Hx of abuse/clean for many years  . Sexual activity: Not on file  Lifestyle  . Physical activity:    Days per week: Not on file    Minutes per session: Not on file  . Stress: Not on file  Relationships  . Social connections:    Talks on phone: Not on file    Gets together: Not on file    Attends religious service: Not on file    Active member of club or organization: Not on file    Attends meetings of clubs or organizations: Not on file    Relationship status: Not on file  . Intimate partner violence:    Fear of current or ex partner: Not on file    Emotionally abused: Not on file    Physically abused: Not on file    Forced sexual activity: Not on file  Other Topics Concern  . 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 acetaminophen (TYLENOL) 500 MG tablet Take 1,000 mg by mouth every 8 (eight) hours as needed for mild pain or headache.    Marland Kitchen apixaban (ELIQUIS) 5 MG TABS tablet Take 1 tablet (5 mg total) by mouth 2 (two) times daily. 180 tablet 0  . atorvastatin (LIPITOR) 80 MG tablet Take 80 mg by mouth at bedtime.   6  . BAYER MICROLET LANCETS lancets USE TO CHECK BLOOD SUGAR 2 TIMES PER DAY. 100 each 12  . benazepril (LOTENSIN) 5 MG tablet Take 5 mg by mouth daily.    . carvedilol (COREG) 12.5 MG tablet Take 1 tablet (12.5 mg total) by mouth 2 (two) times daily with a meal. 180 tablet 3  . CONTOUR NEXT TEST test strip USE TO CHECK BLOOD SUGAR TWICE DAILY AS DIRECTED 100 each 2  . digoxin (LANOXIN) 0.125 MG  tablet TAKE ONE TABLET BY MOUTH ONE TIME DAILY. (Patient taking differently: Take 0.125 mg by mouth daily. ) 90 tablet 1  . furosemide (LASIX) 20 MG tablet Take 20 mg by mouth daily.    . insulin aspart (NOVOLOG FLEXPEN) 100 UNIT/ML FlexPen Inject 16-36 Units into the skin 3 (three) times daily just before  each meal (Patient taking differently: Inject 16-18-36 Units into the skin 3 (three) times daily just before each meal) 30 mL 6  . levothyroxine (SYNTHROID, LEVOTHROID) 88 MCG tablet Take 1 tablet (88 mcg total) by mouth daily before breakfast. 30 tablet 0  . NOVOFINE 32G X 6 MM MISC TEST THREE TIMES DAILY AS DIRECTED 100 each 5  . spironolactone (ALDACTONE) 25 MG tablet Take 0.5 tablets (12.5 mg total) by mouth daily. 45 tablet 3  . [DISCONTINUED] glimepiride (AMARYL) 4 MG tablet 1.5 tablet a day 30 tablet 11   No current facility-administered medications on file prior to visit.     No Known Allergies  Family History  Adopted: Yes  Family history unknown: Yes    There were no vitals taken for this visit.   Review of Systems Denies LOC    Objective:   Physical Exam       Assessment & Plan:  Type 1 DM, with PAD: The pattern of his cbg's indicates he needs some adjustment in his therapy Hypoglycemia: This limits aggressiveness of glycemic control Renal insuff: in this setting, he does not need basal insulin   Patient Instructions  Please change the humalog to 3 times a day (just before each meal), 17-17-36 units check your blood sugar twice a day.  vary the time of day when you check, between before the 3 meals, and at bedtime.  also check if you have symptoms of your blood sugar being too high or too low.  please keep a record of the readings and bring it to your next appointment here.  You can write it on any piece of paper.  please call us sooner if your blood sugar goes below 70, or if you have a lot of readings over 200.   Please come back for a follow-up appointment in 2  months.

## 2018-06-17 NOTE — Patient Instructions (Addendum)
Please change the humalog to 3 times a day (just before each meal), 17-17-36 units check your blood sugar twice a day.  vary the time of day when you check, between before the 3 meals, and at bedtime.  also check if you have symptoms of your blood sugar being too high or too low.  please keep a record of the readings and bring it to your next appointment here.  You can write it on any piece of paper.  please call us sooner if your blood sugar goes below 70, or if you have a lot of readings over 200.   Please come back for a follow-up appointment in 2 months.

## 2018-06-19 ENCOUNTER — Other Ambulatory Visit: Payer: Self-pay

## 2018-06-19 MED ORDER — APIXABAN 5 MG PO TABS: 5.0000 mg | ORAL_TABLET | Freq: Two times a day (BID) | ORAL | 1 refills | Status: DC

## 2018-06-19 NOTE — Telephone Encounter (Signed)
Request for refill received through My Chart.  Pt last saw Dr Harrington Challenger 10/03/17, last labs 09/19/17 Creat 1.66, age 63, weight 92.3kg, based on specified criteria pt is on appropriate dosage of Eliquis 5mg  BID.  Will refill rx.

## 2018-06-19 NOTE — Progress Notes (Signed)
Remote ICD transmission.   

## 2018-08-17 ENCOUNTER — Other Ambulatory Visit: Payer: Self-pay

## 2018-08-19 ENCOUNTER — Other Ambulatory Visit: Payer: Self-pay

## 2018-08-19 ENCOUNTER — Ambulatory Visit: Payer: Managed Care, Other (non HMO) | Admitting: Endocrinology

## 2018-08-19 ENCOUNTER — Encounter: Payer: Self-pay | Admitting: Endocrinology

## 2018-08-19 VITALS — BP 100/60 | HR 109 | Ht 72.0 in | Wt 198.0 lb

## 2018-08-19 DIAGNOSIS — N183 Chronic kidney disease, stage 3 unspecified: Secondary | ICD-10-CM

## 2018-08-19 DIAGNOSIS — Z794 Long term (current) use of insulin: Secondary | ICD-10-CM

## 2018-08-19 DIAGNOSIS — E1122 Type 2 diabetes mellitus with diabetic chronic kidney disease: Secondary | ICD-10-CM | POA: Diagnosis not present

## 2018-08-19 LAB — POCT GLYCOSYLATED HEMOGLOBIN (HGB A1C): Hemoglobin A1C: 6.9 % — AB (ref 4.0–5.6)

## 2018-08-19 MED ORDER — NOVOLOG FLEXPEN 100 UNIT/ML ~~LOC~~ SOPN: PEN_INJECTOR | SUBCUTANEOUS | 6 refills | Status: DC

## 2018-08-19 NOTE — Patient Instructions (Addendum)
Please reduce the supper humalog to 35 units.   check your blood sugar twice a day.  vary the time of day when you check, between before the 3 meals, and at bedtime.  also check if you have symptoms of your blood sugar being too high or too low.  please keep a record of the readings and bring it to your next appointment here.  You can write it on any piece of paper.  please call us sooner if your blood sugar goes below 70, or if you have a lot of readings over 200.   Please come back for a follow-up appointment in 4-5 months.

## 2018-08-19 NOTE — Progress Notes (Signed)
Subjective:    Patient ID: Caleb Bell, male    DOB: 1955/04/26, 63 y.o.   MRN: [id number]  HPI Pt returns for f/u of diabetes mellitus: DM type: 1 Dx'ed: 9373 Complications: polyneuropathy, renal insuff, foot ulcer, retinopathy, PAD, and CAD.   Therapy: insulin since 2010.   DKA: never.   Severe hypoglycemia: never.    Pancreatitis: never.   Other: he takes multiple daily injections; he declines pump therapy; the pattern of his cbg's indicates he does not need basal insulin (prob due to renal insufficiency).    Interval history: pt states he feels well in general. Pt says cbg varies from 54-209.  since last ov, he has had just 1 episode of hypoglycemia, and this was mild.  It happens after supper.  He is unable to say why this happened. Past Medical History:  Diagnosis Date  . Atrial fibrillation (HCC)    Paroxysmal, limited  . CAD (coronary artery disease)    CABG 2003  . Chronic systolic heart failure (HCC)    EF 20-25% echo 5- 2011  . CRI (chronic renal insufficiency)     multifactorial... Dr. Posey Pronto.. consult September 18, 2009  . DM type 2 (diabetes mellitus, type 2) (HCC)    Dr Loanne Drilling  . Ejection fraction < 50%    EF 25%, echo, November, 2012  //   planning followup 2-D echo to assess LV function with CRT D.  device in place  . Elevated serum creatinine   . Epididymal cyst 2017   right, no testis masses  . Hodgkin's disease     Dx aprox 2004, s/p XRT-Chemo/had radiation too  . Hyperkalemia    August, 2011, Aldactone, Aldactone stopped  . Hyperlipidemia    low HDL  . Hypothyroidism   . ICD (implantable cardiac defibrillator) in place    CRT-D placed March, 2013  . Ischemic cardiomyopathy    MRI12/12 no real viability in hypo/akinetic segment  CAth native and graft disease  . IVCD (intraventricular conduction defect)   . Myocardial infarction ([place])    mild MI/ several MI per pt in the past.  . Personal history of colonic adenomas 12/24/2011  . Polysubstance    Alcohol, cocaine- resoloved for many years     Past Surgical History:  Procedure Laterality Date  . BI-VENTRICULAR IMPLANTABLE CARDIOVERTER DEFIBRILLATOR N/A 04/01/2011   Procedure: BI-VENTRICULAR IMPLANTABLE CARDIOVERTER DEFIBRILLATOR  (CRT-D);  Surgeon: Deboraha Sprang, MD;  Location: [facility] CATH LAB;  Service: Cardiovascular;  Laterality: N/A;  . BIV ICD GENERATOR CHANGEOUT N/A 03/10/2017   Procedure: BIV ICD GENERATOR CHANGEOUT;  Surgeon: Deboraha Sprang, MD;  Location: [place] CV LAB;  Service: Cardiovascular;  Laterality: N/A;  . CARDIAC DEFIBRILLATOR PLACEMENT  03/2011  . COLONOSCOPY  12/24/2011   Procedure: COLONOSCOPY;  Surgeon: [name], MD;  Location: [facility] ENDOSCOPY;  Service: Endoscopy;  Laterality: N/A;  . COLONOSCOPY WITH PROPOFOL N/A 06/30/2017   Procedure: COLONOSCOPY WITH PROPOFOL;  Surgeon: [name], MD;  Location: [facility] ENDOSCOPY;  Service: Endoscopy;  Laterality: N/A;  . CORONARY ARTERY BYPASS GRAFT  2003  . CYSTECTOMY     Upper Back  . POLYPECTOMY  06/30/2017   Procedure: POLYPECTOMY;  Surgeon: [name], MD;  Location: [facility];  Service: Endoscopy;;  . TONSILLECTOMY      Social History   Socioeconomic History  . Marital status: Married    Spouse name: Not on file  . Number of children: 0  . Years of education:  Not on file  . Highest education level: Not on file  Occupational History  . Occupation: musician  Social Needs  . Financial resource strain: Not on file  . Food insecurity    Worry: Not on file    Inability: Not on file  . Transportation needs    Medical: Not on file    Non-medical: Not on file  Tobacco Use  . Smoking status: Never Smoker  . Smokeless tobacco: Never Used  Substance and Sexual Activity  . Alcohol use: No    Alcohol/week: 0.0 standard drinks    Comment: Hx of abuse/clean since 2003   . Drug use: No    Comment: Hx of abuse/clean for many years  . Sexual activity: Not on file  Lifestyle  . Physical activity     Days per week: Not on file    Minutes per session: Not on file  . Stress: Not on file  Relationships  . Social Herbalist on phone: Not on file    Gets together: Not on file    Attends religious service: Not on file    Active member of club or organization: Not on file    Attends meetings of clubs or organizations: Not on file    Relationship status: Not on file  . Intimate partner violence    Fear of current or ex partner: Not on file    Emotionally abused: Not on file    Physically abused: Not on file    Forced sexual activity: Not on file  Other Topics Concern  . 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 acetaminophen (TYLENOL) 500 MG tablet Take 1,000 mg by mouth every 8 (eight) hours as needed for mild pain or headache.    Marland Kitchen apixaban (ELIQUIS) 5 MG TABS tablet Take 1 tablet (5 mg total) by mouth 2 (two) times daily. 180 tablet 1  . atorvastatin (LIPITOR) 80 MG tablet Take 80 mg by mouth at bedtime.   6  . BAYER MICROLET LANCETS lancets USE TO CHECK BLOOD SUGAR 2 TIMES PER DAY. 100 each 12  . benazepril (LOTENSIN) 5 MG tablet Take 5 mg by mouth daily.    . carvedilol (COREG) 12.5 MG tablet Take 1 tablet (12.5 mg total) by mouth 2 (two) times daily with a meal. 180 tablet 3  . CONTOUR NEXT TEST test strip USE TO CHECK BLOOD SUGAR TWICE DAILY AS DIRECTED 100 each 2  . digoxin (LANOXIN) 0.125 MG tablet TAKE ONE TABLET BY MOUTH ONE TIME DAILY. (Patient taking differently: Take 0.125 mg by mouth daily. ) 90 tablet 1  . furosemide (LASIX) 20 MG tablet Take 20 mg by mouth daily.    Marland Kitchen levothyroxine (SYNTHROID, LEVOTHROID) 88 MCG tablet Take 1 tablet (88 mcg total) by mouth daily before breakfast. 30 tablet 0  . NOVOFINE 32G X 6 MM MISC TEST  THREE TIMES DAILY AS DIRECTED 100 each 5  . spironolactone (ALDACTONE) 25 MG tablet Take 0.5 tablets (12.5 mg total) by mouth daily. 45 tablet 3  . [DISCONTINUED] glimepiride (AMARYL) 4 MG tablet 1.5 tablet a day 30 tablet 11   No current facility-administered medications on file prior to  visit.     No Known Allergies  Family History  Adopted: Yes  Family history unknown: Yes    BP 100/60 (BP Location: Left Arm, Patient Position: Sitting, Cuff Size: Normal)   Pulse (!) 109   Ht 6' (1.829 m)   Wt 198 lb (89.8 kg)   SpO2 95%   BMI 26.85 kg/m   Review of Systems He has lost a few lbs, due to his efforts.      Objective:   Physical Exam VITAL SIGNS:  See vs page GENERAL: no distress Pulses: dorsalis pedis intact bilat.   MSK: no deformity of the feet CV: no leg edema Skin:  no ulcer on the feet.  normal color and temp on the feet. Neuro: sensation is intact to touch on the feet  Lab Results  Component Value Date   HGBA1C 6.9 (A) 08/19/2018   Lab Results  Component Value Date   CREATININE 1.66 (H) 09/19/2017   BUN 37 (H) 09/19/2017   NA 132 (L) 09/19/2017   K 4.7 09/19/2017   CL 95 (L) 09/19/2017   CO2 23 09/19/2017      Assessment & Plan:  Type 1 DM, with PAD: well-controlled Hypoglycemia: this limits aggressiveness of glycemic control Renal insuff: this is the likely reason why pt does not seem to need basal insulin.  Patient Instructions  Please reduce the supper humalog to 35 units.   check your blood sugar twice a day.  vary the time of day when you check, between before the 3 meals, and at bedtime.  also check if you have symptoms of your blood sugar being too high or too low.  please keep a record of the readings and bring it to your next appointment here.  You can write it on any piece of paper.  please call us sooner if your blood sugar goes below 70, or if you have a lot of readings over 200.   Please come back for a follow-up appointment in 4-5 months.

## 2018-08-23 ENCOUNTER — Other Ambulatory Visit: Payer: Self-pay

## 2018-08-24 ENCOUNTER — Telehealth: Payer: Self-pay

## 2018-08-24 MED ORDER — SPIRONOLACTONE 25 MG PO TABS: 12.5000 mg | ORAL_TABLET | Freq: Every day | ORAL | 0 refills | Status: DC

## 2018-08-24 NOTE — Telephone Encounter (Signed)
The pt is calling because he has questions about one his medications. The request to refill his prescription from his pharmacy was sent and no one has approved it and he is out. [facility]? I told the pt I will send the phone note to Dr. Caryl Comes nurse and have someone give him a call back.

## 2018-08-31 NOTE — Telephone Encounter (Signed)
LVM for return call. 

## 2018-09-09 ENCOUNTER — Ambulatory Visit (INDEPENDENT_AMBULATORY_CARE_PROVIDER_SITE_OTHER): Payer: Managed Care, Other (non HMO) | Admitting: *Deleted

## 2018-09-09 DIAGNOSIS — I5022 Chronic systolic (congestive) heart failure: Secondary | ICD-10-CM

## 2018-09-09 DIAGNOSIS — I255 Ischemic cardiomyopathy: Secondary | ICD-10-CM

## 2018-09-09 LAB — CUP PACEART REMOTE DEVICE CHECK
Battery Remaining Longevity: 56 mo
Battery Remaining Percentage: 73 %
Battery Voltage: 2.96 V
Brady Statistic AP VP Percent: 83 %
Brady Statistic AP VS Percent: 1.9 %
Brady Statistic AS VP Percent: 11 %
Brady Statistic AS VS Percent: 1 %
Brady Statistic RA Percent Paced: 79 %
Date Time Interrogation Session: 20200819093722
HighPow Impedance: 66 Ohm
HighPow Impedance: 66 Ohm
Implantable Lead Implant Date: 20130311
Implantable Lead Implant Date: 20130311
Implantable Lead Implant Date: 20130311
Implantable Lead Location: 753858
Implantable Lead Location: 753859
Implantable Lead Location: 753860
Implantable Lead Model: 7122
Implantable Pulse Generator Implant Date: 20190218
Lead Channel Impedance Value: 360 Ohm
Lead Channel Impedance Value: 430 Ohm
Lead Channel Impedance Value: 730 Ohm
Lead Channel Pacing Threshold Amplitude: 0.75 V
Lead Channel Pacing Threshold Amplitude: 1.125 V
Lead Channel Pacing Threshold Amplitude: 1.875 V
Lead Channel Pacing Threshold Pulse Width: 0.5 ms
Lead Channel Pacing Threshold Pulse Width: 0.5 ms
Lead Channel Pacing Threshold Pulse Width: 0.8 ms
Lead Channel Sensing Intrinsic Amplitude: 11.9 mV
Lead Channel Sensing Intrinsic Amplitude: 5 mV
Lead Channel Setting Pacing Amplitude: 1.75 V
Lead Channel Setting Pacing Amplitude: 2.125
Lead Channel Setting Pacing Amplitude: 2.125
Lead Channel Setting Pacing Pulse Width: 0.5 ms
Lead Channel Setting Pacing Pulse Width: 0.8 ms
Lead Channel Setting Sensing Sensitivity: 0.5 mV
Pulse Gen Serial Number: 9780477

## 2018-09-17 ENCOUNTER — Encounter: Payer: Self-pay | Admitting: Cardiology

## 2018-09-17 NOTE — Progress Notes (Signed)
Remote ICD transmission.   

## 2018-09-29 ENCOUNTER — Other Ambulatory Visit: Payer: Self-pay | Admitting: Internal Medicine

## 2018-09-29 MED ORDER — CARVEDILOL 12.5 MG PO TABS: 12.5000 mg | ORAL_TABLET | Freq: Two times a day (BID) | ORAL | 0 refills | Status: DC

## 2018-09-29 NOTE — Telephone Encounter (Signed)
Pt's medication was sent to pt's pharmacy as requested. Confirmation received.  °

## 2018-10-09 ENCOUNTER — Ambulatory Visit: Payer: Managed Care, Other (non HMO) | Admitting: Internal Medicine

## 2018-10-09 ENCOUNTER — Encounter: Payer: Self-pay | Admitting: Internal Medicine

## 2018-10-09 ENCOUNTER — Other Ambulatory Visit: Payer: Self-pay

## 2018-10-09 VITALS — BP 94/58 | HR 60 | Ht 73.0 in | Wt 200.1 lb

## 2018-10-09 DIAGNOSIS — I5022 Chronic systolic (congestive) heart failure: Secondary | ICD-10-CM

## 2018-10-09 DIAGNOSIS — E782 Mixed hyperlipidemia: Secondary | ICD-10-CM

## 2018-10-09 DIAGNOSIS — I480 Paroxysmal atrial fibrillation: Secondary | ICD-10-CM

## 2018-10-09 NOTE — Progress Notes (Signed)
Cardiology Office Note   Date:  10/09/2018   ID:  Caleb Bell, DOB 02-15-1955, MRN [id number]  PCP:  Lavone Orn, MD  Cardiologist:   Dorris Carnes, MD   F?U of CAD and systolic CHF      History of Present Illness: Caleb Bell is a 63 y.o. 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 He is followed by Olin Pia in EP  He also has a history of PAF, renal insuff, Hodgkins dz (s/p chemo and XRT)   Echo in Feb 2019 LVEF 30 to 35%  Mid AS  RVEF normal   Carotid Dopplers 12/15 bilateral 40-59% stenosis  I saw the pt in Sept 2019  SInce seen he says he is feeling better Breathing is OK  Goes up and down stairs without a problem   He denies CP   No edema   No dizziness  No palpitations   Current Outpatient Medications  Medication Sig Dispense Refill  . acetaminophen (TYLENOL) 500 MG tablet Take 1,000 mg by mouth every 8 (eight) hours as needed for mild pain or headache.    Marland Kitchen apixaban (ELIQUIS) 5 MG TABS tablet Take 1 tablet (5 mg total) by mouth 2 (two) times daily. 180 tablet 1  . atorvastatin (LIPITOR) 80 MG tablet Take 80 mg by mouth at bedtime.   6  . BAYER MICROLET LANCETS lancets USE TO CHECK BLOOD SUGAR 2 TIMES PER DAY. 100 each 12  . benazepril (LOTENSIN) 5 MG tablet Take 5 mg by mouth daily.    . carvedilol (COREG) 12.5 MG tablet Take 1 tablet (12.5 mg total) by mouth 2 (two) times daily with a meal. Please keep upcoming appt in September with Dr. Harrington Challenger for future refills. Thank you 180 tablet 0  . CONTOUR NEXT TEST test strip USE TO CHECK BLOOD SUGAR TWICE DAILY AS DIRECTED 100 each 2  . digoxin (LANOXIN) 0.125 MG tablet TAKE ONE TABLET BY MOUTH ONE TIME DAILY. (Patient taking differently: Take 0.125 mg by mouth daily. ) 90 tablet 1  . furosemide (LASIX) 20 MG tablet Take 20 mg by mouth daily.    . insulin aspart (NOVOLOG FLEXPEN) 100 UNIT/ML FlexPen 3 times a day (just before  each meal) 17-17-35 units 30 mL 6  . levothyroxine (SYNTHROID, LEVOTHROID) 88 MCG tablet Take 1 tablet (88 mcg total) by mouth daily before breakfast. 30 tablet 0  . NOVOFINE 32G X 6 MM MISC TEST THREE TIMES DAILY AS DIRECTED 100 each 5  . spironolactone (ALDACTONE) 25 MG tablet Take 0.5 tablets (12.5 mg total) by mouth daily. Please make appt for future refills. [phone] 45 tablet 0   No current facility-administered medications for this visit.     Allergies:   Patient has no known allergies.   Past Medical History:  Diagnosis Date  . Atrial fibrillation (HCC)    Paroxysmal, limited  . CAD (coronary artery disease)    CABG 2003  . Chronic systolic heart failure (HCC)    EF 20-25% echo 5- 2011  . CRI (chronic renal insufficiency)     multifactorial... Dr. Posey Pronto.. consult September 18, 2009  . DM type 2 (diabetes mellitus, type 2) (HCC)    Dr Loanne Drilling  . Ejection fraction < 50%    EF 25%, echo, November, 2012  //   planning followup 2-D echo to assess LV function with CRT D.  device in  place  . Elevated serum creatinine   . Epididymal cyst 2017   right, no testis masses  . Hodgkin's disease     Dx aprox 2004, s/p XRT-Chemo/had radiation too  . Hyperkalemia    August, 2011, Aldactone, Aldactone stopped  . Hyperlipidemia    low HDL  . Hypothyroidism   . ICD (implantable cardiac defibrillator) in place    CRT-D placed March, 2013  . Ischemic cardiomyopathy    MRI12/12 no real viability in hypo/akinetic segment  CAth native and graft disease  . IVCD (intraventricular conduction defect)   . Myocardial infarction ([place])    mild MI/ several MI per pt in the past.  . Personal history of colonic adenomas 12/24/2011  . Polysubstance    Alcohol, cocaine- resoloved for many years     Past Surgical History:  Procedure Laterality Date  . BI-VENTRICULAR IMPLANTABLE CARDIOVERTER DEFIBRILLATOR N/A 04/01/2011   Procedure: BI-VENTRICULAR IMPLANTABLE CARDIOVERTER DEFIBRILLATOR  (CRT-D);   Surgeon: Deboraha Sprang, MD;  [facility] CATH LAB;  Service: Cardiovascular;  Laterality: N/A;  . BIV ICD GENERATOR CHANGEOUT N/A 03/10/2017   Procedure: BIV ICD GENERATOR CHANGEOUT;  Surgeon: Deboraha Sprang, MD;  Location: [facility] CV LAB;  Service: Cardiovascular;  Laterality: N/A;  . CARDIAC DEFIBRILLATOR PLACEMENT  03/2011  . COLONOSCOPY  12/24/2011   Procedure: COLONOSCOPY;  Surgeon: [name], MD;  Location: [facility] ENDOSCOPY;  Service: Endoscopy;  Laterality: N/A;  . COLONOSCOPY WITH PROPOFOL N/A 06/30/2017   Procedure: COLONOSCOPY WITH PROPOFOL;  Surgeon: [name], MD;  Location: [facility] ENDOSCOPY;  Service: Endoscopy;  Laterality: N/A;  . CORONARY ARTERY BYPASS GRAFT  2003  . CYSTECTOMY     Upper Back  . POLYPECTOMY  06/30/2017   Procedure: POLYPECTOMY;  Surgeon: [name], MD;  Location: [facility];  Service: Endoscopy;;  . TONSILLECTOMY       Social History:  The patient  reports that he has never smoked. He has never used smokeless tobacco. He reports that he does not drink alcohol or use drugs.   Family History:  The patient's He was adopted. Family history is unknown by patient.    ROS:  Please see the history of present illness. All other systems are reviewed and  Negative to the above problem except as noted.    PHYSICAL EXAM: VS:  BP (!) 94/58 (BP Location: Right Arm, Patient Position: Sitting, Cuff Size: Normal) Comment: Left arm 98/61  Pulse 60   Ht 6\' 1"  (1.854 m)   Wt 200 lb 1.9 oz (90.8 kg)   BMI 26.40 kg/m   GEN: Well nourished, well developed, in no acute distress  HEENT: normal  Neck: No JVD   No carotid bruits Cardiac: RRR; no murmurs, rubs, or gallops,no edema  Respiratory:  clear to auscultation bilaterally, normal work of breathing GI: soft, nontender, nondistended, + BS  No hepatomegaly  MS: no deformity Moving all extremities   Skin: warm and dry, no rash Neuro:  Strength and sensation are intact Psych: euthymic mood, full affect    EKG:  EKG is ordered today  AV paced   BiV pacer 60 bpm     Lipid Panel    Component Value Date/Time   CHOL 146 07/12/2016 0912   TRIG 284 (H) 07/12/2016 0912   TRIG 306 (HH) 11/06/2005 1313   HDL 32 (L) 07/12/2016 0912   CHOLHDL 4.6 07/12/2016 0912   CHOLHDL 4 11/15/2013 0911   VLDL 33.8 11/15/2013 0911   LDLCALC 57  07/12/2016 0912   LDLDIRECT 66.3 11/11/2012 0932      Wt Readings from Last 3 Encounters:  10/09/18 200 lb 1.9 oz (90.8 kg)  08/19/18 198 lb (89.8 kg)  02/16/18 203 lb 6.4 oz (92.3 kg)      ASSESSMENT AND PLAN:  1  CAD  REmote CABG   No recent ischemic evaluation Bu active   FOr now I would continue current medical Rx The patient denies symptoms of angina     2.  Chronic systolic CHF  Volume is good   Keep on same meds  He is s/p BiVICD   WIll make sure he has f/u in EP  3.  HL   Keep on current meds   Check labs today   4  DM  Followed in IM   Continue to stay active    WIll make sure he has f/u with EP     Signed, Dorris Carnes, MD  10/09/2018 3:06 PM    [address], [place], [place]  [address] Phone: [phone]; Fax: [phone]

## 2018-10-09 NOTE — Patient Instructions (Addendum)
Medication Instructions:  Your physician recommends that you continue on your current medications as directed. Please refer to the Current Medication list given to you today.  If you need a refill on your cardiac medications before your next appointment, please call your pharmacy.   Lab work: Your physician recommends that you have labs drawn today: CBC, BMET, Dig Level, Lipids  If you have labs (blood work) drawn today and your tests are completely normal, you will receive your results only by: Marland Kitchen MyChart Message (if you have MyChart) OR . A paper copy in the mail If you have any lab test that is abnormal or we need to change your treatment, we will call you to review the results.  Testing/Procedures: None Ordered  Follow-Up: At [facility], you and your health needs are our priority.  As part of our continuing mission to provide you with exceptional heart care, we have created designated Provider Care Teams.  These Care Teams include your primary Cardiologist (physician) and Advanced Practice Providers (APPs -  Physician Assistants and Nurse Practitioners) who all work together to provide you with the care you need, when you need it. You will need a follow up appointment in:  1 years.  Please call our office 2 months in advance to schedule this appointment.  You may see Dorris Carnes, MD or one of the following Advanced Practice Providers on your designated Care Team: Richardson Dopp, PA-C [place], [place] . Daune Perch, NP  Any Other Special Instructions Will Be Listed Below (If Applicable). Follow-up with Dr. Jens Som

## 2018-10-10 LAB — CBC
Hematocrit: 35.9 % — ABNORMAL LOW (ref 37.5–51.0)
Hemoglobin: 12.7 g/dL — ABNORMAL LOW (ref 13.0–17.7)
MCH: 28.5 pg (ref 26.6–33.0)
MCHC: 35.4 g/dL (ref 31.5–35.7)
MCV: 81 fL (ref 79–97)
Platelets: 202 10*3/uL (ref 150–450)
RBC: 4.46 x10E6/uL (ref 4.14–5.80)
RDW: 13.6 % (ref 11.6–15.4)
WBC: 7.2 10*3/uL (ref 3.4–10.8)

## 2018-10-10 LAB — DIGOXIN LEVEL: Digoxin, Serum: 0.8 ng/mL (ref 0.5–0.9)

## 2018-10-10 LAB — BASIC METABOLIC PANEL
BUN/Creatinine Ratio: 24 (ref 10–24)
BUN: 37 mg/dL — ABNORMAL HIGH (ref 8–27)
CO2: 26 mmol/L (ref 20–29)
Calcium: 9.9 mg/dL (ref 8.6–10.2)
Chloride: 95 mmol/L — ABNORMAL LOW (ref 96–106)
Creatinine, Ser: 1.53 mg/dL — ABNORMAL HIGH (ref 0.76–1.27)
GFR calc Af Amer: 56 mL/min/{1.73_m2} — ABNORMAL LOW (ref >59)
GFR calc non Af Amer: 48 mL/min/{1.73_m2} — ABNORMAL LOW (ref >59)
Glucose: 156 mg/dL — ABNORMAL HIGH (ref 65–99)
Potassium: 4.7 mmol/L (ref 3.5–5.2)
Sodium: 135 mmol/L (ref 134–144)

## 2018-10-10 LAB — LIPID PANEL
Chol/HDL Ratio: 5.4 ratio — ABNORMAL HIGH (ref 0.0–5.0)
Cholesterol, Total: 156 mg/dL (ref 100–199)
HDL: 29 mg/dL — ABNORMAL LOW (ref >39)
LDL Chol Calc (NIH): 54 mg/dL (ref 0–99)
Triglycerides: 489 mg/dL — ABNORMAL HIGH (ref 0–149)
VLDL Cholesterol Cal: 73 mg/dL — ABNORMAL HIGH (ref 5–40)

## 2018-10-13 NOTE — Telephone Encounter (Signed)
lmtcb regarding labwork

## 2018-10-15 ENCOUNTER — Telehealth: Payer: Self-pay | Admitting: Internal Medicine

## 2018-10-15 NOTE — Telephone Encounter (Signed)
BP is OK He is feeling OK I would keep on same regimen

## 2018-10-19 NOTE — Telephone Encounter (Signed)
Pt aware to continue same medications.  No changes. He will continue to monitor BP periodically.  Will send via MyChart if starts to notice they become elevated or low.  Thanked me for the call.

## 2018-10-26 ENCOUNTER — Other Ambulatory Visit: Payer: Self-pay | Admitting: Internal Medicine

## 2018-10-26 NOTE — Addendum Note (Signed)
Addended by: Patterson Hammersmith A on: 10/26/2018 04:38 PM   Modules accepted: Orders

## 2018-11-25 ENCOUNTER — Other Ambulatory Visit: Payer: Self-pay | Admitting: Internal Medicine

## 2018-11-25 NOTE — Telephone Encounter (Signed)
Prescription refill request for Eliquis received.  Last office visit: Caleb Bell 10-09-2018 Scr: 1.53 (10-09-2018) Age: 63 y.o.  Weight: 90.8 kg   Prescription refill sent.

## 2018-12-09 ENCOUNTER — Ambulatory Visit (INDEPENDENT_AMBULATORY_CARE_PROVIDER_SITE_OTHER): Payer: Managed Care, Other (non HMO) | Admitting: *Deleted

## 2018-12-09 DIAGNOSIS — I255 Ischemic cardiomyopathy: Secondary | ICD-10-CM | POA: Diagnosis not present

## 2018-12-09 DIAGNOSIS — I5022 Chronic systolic (congestive) heart failure: Secondary | ICD-10-CM

## 2018-12-09 LAB — CUP PACEART REMOTE DEVICE CHECK
Battery Remaining Longevity: 52 mo
Battery Remaining Percentage: 69 %
Battery Voltage: 2.95 V
Brady Statistic AP VP Percent: 83 %
Brady Statistic AP VS Percent: 1.8 %
Brady Statistic AS VP Percent: 11 %
Brady Statistic AS VS Percent: 1 %
Brady Statistic RA Percent Paced: 80 %
Date Time Interrogation Session: 20201118090027
HighPow Impedance: 66 Ohm
HighPow Impedance: 66 Ohm
Implantable Lead Implant Date: 20130311
Implantable Lead Implant Date: 20130311
Implantable Lead Implant Date: 20130311
Implantable Lead Location: 753858
Implantable Lead Location: 753859
Implantable Lead Location: 753860
Implantable Lead Model: 7122
Implantable Pulse Generator Implant Date: 20190218
Lead Channel Impedance Value: 350 Ohm
Lead Channel Impedance Value: 390 Ohm
Lead Channel Impedance Value: 790 Ohm
Lead Channel Pacing Threshold Amplitude: 1 V
Lead Channel Pacing Threshold Amplitude: 1 V
Lead Channel Pacing Threshold Amplitude: 2.25 V
Lead Channel Pacing Threshold Pulse Width: 0.5 ms
Lead Channel Pacing Threshold Pulse Width: 0.5 ms
Lead Channel Pacing Threshold Pulse Width: 0.8 ms
Lead Channel Sensing Intrinsic Amplitude: 12 mV
Lead Channel Sensing Intrinsic Amplitude: 4.4 mV
Lead Channel Setting Pacing Amplitude: 2 V
Lead Channel Setting Pacing Amplitude: 2 V
Lead Channel Setting Pacing Amplitude: 2.5 V
Lead Channel Setting Pacing Pulse Width: 0.5 ms
Lead Channel Setting Pacing Pulse Width: 0.8 ms
Lead Channel Setting Sensing Sensitivity: 0.5 mV
Pulse Gen Serial Number: 9780477

## 2018-12-11 LAB — TSH: TSH: 3.93 (ref <=5.90)

## 2018-12-14 ENCOUNTER — Ambulatory Visit: Payer: Managed Care, Other (non HMO) | Admitting: Internal Medicine

## 2018-12-14 ENCOUNTER — Other Ambulatory Visit (INDEPENDENT_AMBULATORY_CARE_PROVIDER_SITE_OTHER): Payer: Managed Care, Other (non HMO)

## 2018-12-14 ENCOUNTER — Other Ambulatory Visit: Payer: Self-pay

## 2018-12-14 ENCOUNTER — Encounter: Payer: Self-pay | Admitting: Internal Medicine

## 2018-12-14 DIAGNOSIS — I454 Nonspecific intraventricular block: Secondary | ICD-10-CM

## 2018-12-14 DIAGNOSIS — I255 Ischemic cardiomyopathy: Secondary | ICD-10-CM | POA: Diagnosis not present

## 2018-12-14 DIAGNOSIS — I480 Paroxysmal atrial fibrillation: Secondary | ICD-10-CM | POA: Diagnosis not present

## 2018-12-14 LAB — CUP PACEART INCLINIC DEVICE CHECK
Battery Remaining Longevity: 36 mo
Brady Statistic RA Percent Paced: 80 %
Brady Statistic RV Percent Paced: 93 %
Date Time Interrogation Session: 20201123113300
HighPow Impedance: 65.25 Ohm
Implantable Lead Implant Date: 20130311
Implantable Lead Implant Date: 20130311
Implantable Lead Implant Date: 20130311
Implantable Lead Location: 753858
Implantable Lead Location: 753859
Implantable Lead Location: 753860
Implantable Lead Model: 7122
Implantable Pulse Generator Implant Date: 20190218
Lead Channel Impedance Value: 337.5 Ohm
Lead Channel Impedance Value: 350 Ohm
Lead Channel Impedance Value: 787.5 Ohm
Lead Channel Pacing Threshold Amplitude: 0.75 V
Lead Channel Pacing Threshold Amplitude: 0.875 V
Lead Channel Pacing Threshold Amplitude: 3 V
Lead Channel Pacing Threshold Pulse Width: 0.5 ms
Lead Channel Pacing Threshold Pulse Width: 0.5 ms
Lead Channel Pacing Threshold Pulse Width: 0.8 ms
Lead Channel Sensing Intrinsic Amplitude: 12 mV
Lead Channel Sensing Intrinsic Amplitude: 4.4 mV
Lead Channel Setting Pacing Amplitude: 1.875
Lead Channel Setting Pacing Amplitude: 2 V
Lead Channel Setting Pacing Amplitude: 3.75 V
Lead Channel Setting Pacing Pulse Width: 0.5 ms
Lead Channel Setting Pacing Pulse Width: 0.8 ms
Lead Channel Setting Sensing Sensitivity: 0.5 mV
Pulse Gen Serial Number: 9780477

## 2018-12-14 NOTE — Patient Instructions (Signed)
Medication Instructions:   *If you need a refill on your cardiac medications before your next appointment, please call your pharmacy*  Lab Work:  If you have labs (blood work) drawn today and your tests are completely normal, you will receive your results only by: Marland Kitchen MyChart Message (if you have MyChart) OR . A paper copy in the mail If you have any lab test that is abnormal or we need to change your treatment, we will call you to review the results.  Testing/Procedures:   Follow-Up: At [facility], you and your health needs are our priority.  As part of our continuing mission to provide you with exceptional heart care, we have created designated Provider Care Teams.  These Care Teams include your primary Cardiologist (physician) and Advanced Practice Providers (APPs -  Physician Assistants and Nurse Practitioners) who all work together to provide you with the care you need, when you need it.  Your next appointment:   12 month(s)  The format for your next appointment:   In Person  Provider:   Virl Axe, MD  Other Instructions

## 2018-12-14 NOTE — Progress Notes (Signed)
Patient Care Team: Lavone Orn, MD as PCP - General (Internal Medicine) Fay Records, MD as PCP - Cardiology (Cardiology) Elmarie Shiley, MD (Nephrology) Deboraha Sprang, MD (Cardiology) Renato Shin, MD as Consulting Physician (Endocrinology)   HPI  Caleb Bell is a 63 y.o. male Seen in follow-up for ischemic cardiomyopathy with prior bypass surgery and an IVCD for which he underwent CRT-D 3/13 with generator change 2/19.    There have been change in RV pacing threshold which would raise the issue as to whether an RV lead revision will be necessary; at time of revision it was elected not to   Device interrogation demonstrates an abrupt change in auto capture threshold in the last month  Date RV threshold  2016 1.25/0.8  2/19 2.0/0.8  5/19 1.875/0.8  11/20 3V/0.8   The patient denies chest pain, shortness of breath, nocturnal dyspnea, orthopnea or peripheral edema.  There have been no palpitations, lightheadedness or syncope.       Remote history also notable for Hodgkin's disease and PAF.   DATE TEST EF   10/13 Echo  20-25 %   12/15 Echo   20.25 %   2/19 Echo  30-35%         Date Cr K Dig Hgb  2/19 1.39 4.7  12.9   9/20 1.53 4.7 0.8 12.7       Past Medical History:  Diagnosis Date  . Atrial fibrillation (HCC)    Paroxysmal, limited  . CAD (coronary artery disease)    CABG 2003  . Chronic systolic heart failure (HCC)    EF 20-25% echo 5- 2011  . CRI (chronic renal insufficiency)     multifactorial... Dr. Posey Pronto.. consult September 18, 2009  . DM type 2 (diabetes mellitus, type 2) (HCC)    Dr Loanne Drilling  . Ejection fraction < 50%    EF 25%, echo, November, 2012  //   planning followup 2-D echo to assess LV function with CRT D.  device in place  . Elevated serum creatinine   . Epididymal cyst 2017   right, no testis masses  . Hodgkin's disease     Dx aprox 2004, s/p XRT-Chemo/had radiation too  . Hyperkalemia    August, 2011, Aldactone,  Aldactone stopped  . Hyperlipidemia    low HDL  . Hypothyroidism   . ICD (implantable cardiac defibrillator) in place    CRT-D placed March, 2013  . Ischemic cardiomyopathy    MRI12/12 no real viability in hypo/akinetic segment  CAth native and graft disease  . IVCD (intraventricular conduction defect)   . Myocardial infarction ([place])    mild MI/ several MI per pt in the past.  . Personal history of colonic adenomas 12/24/2011  . Polysubstance    Alcohol, cocaine- resoloved for many years     Past Surgical History:  Procedure Laterality Date  . BI-VENTRICULAR IMPLANTABLE CARDIOVERTER DEFIBRILLATOR N/A 04/01/2011   Procedure: BI-VENTRICULAR IMPLANTABLE CARDIOVERTER DEFIBRILLATOR  (CRT-D);  Surgeon: Deboraha Sprang, MD;  Location: [facility] CATH LAB;  Service: Cardiovascular;  Laterality: N/A;  . BIV ICD GENERATOR CHANGEOUT N/A 03/10/2017   Procedure: BIV ICD GENERATOR CHANGEOUT;  Surgeon: Deboraha Sprang, MD;  Location: [facility] CV LAB;  Service: Cardiovascular;  Laterality: N/A;  . CARDIAC DEFIBRILLATOR PLACEMENT  03/2011  . COLONOSCOPY  12/24/2011   Procedure: COLONOSCOPY;  Surgeon: [name], MD;  Location: [facility] ENDOSCOPY;  Service: Endoscopy;  Laterality: N/A;  . COLONOSCOPY WITH PROPOFOL N/A 06/30/2017  Procedure: COLONOSCOPY WITH PROPOFOL;  Surgeon: [name], MD;  Location: [facility] ENDOSCOPY;  Service: Endoscopy;  Laterality: N/A;  . CORONARY ARTERY BYPASS GRAFT  2003  . CYSTECTOMY     Upper Back  . POLYPECTOMY  06/30/2017   Procedure: POLYPECTOMY;  Surgeon: [name], MD;  Location: [facility] ENDOSCOPY;  Service: Endoscopy;;  . TONSILLECTOMY      Current Meds  Medication Sig  . acetaminophen (TYLENOL) 500 MG tablet Take 1,000 mg by mouth every 8 (eight) hours as needed for mild pain or headache.  Marland Kitchen atorvastatin (LIPITOR) 80 MG tablet Take 80 mg by mouth at bedtime.   Marland Kitchen BAYER MICROLET LANCETS lancets USE TO CHECK BLOOD SUGAR 2 TIMES PER DAY.  . benazepril (LOTENSIN) 5 MG tablet  Take 5 mg by mouth daily.  . carvedilol (COREG) 12.5 MG tablet Take 1 tablet (12.5 mg total) by mouth 2 (two) times daily with a meal. Please keep upcoming appt in September with Dr. Harrington Challenger for future refills. Thank you  . CONTOUR NEXT TEST test strip USE TO CHECK BLOOD SUGAR TWICE DAILY AS DIRECTED  . digoxin (LANOXIN) 0.125 MG tablet TAKE ONE TABLET BY MOUTH ONE TIME DAILY.  Marland Kitchen ELIQUIS 5 MG TABS tablet TAKE 1 TABLET(5 MG) BY MOUTH TWICE DAILY  . furosemide (LASIX) 20 MG tablet Take 20 mg by mouth daily.  . insulin aspart (NOVOLOG FLEXPEN) 100 UNIT/ML FlexPen 3 times a day (just before each meal) 17-17-35 units  . levothyroxine (SYNTHROID, LEVOTHROID) 88 MCG tablet Take 1 tablet (88 mcg total) by mouth daily before breakfast.  . NOVOFINE 32G X 6 MM MISC TEST THREE TIMES DAILY AS DIRECTED  . spironolactone (ALDACTONE) 25 MG tablet TAKE 1/2 TABLET BY MOUTH DAILY    No Known Allergies    Review of Systems negative except from HPI and PMH  Physical Exam BP 108/66   Pulse 62   Ht 6\' 1"  (1.854 m)   Wt 199 lb 12.8 oz (90.6 kg)   SpO2 98%   BMI 26.36 kg/m  Well developed and well nourished in no acute distress HENT normal Neck supple with JVP-flat Clear Device pocket well healed; without hematoma or erythema.  There is no tethering  Regular rate and rhythm, no  murmur Abd-soft with active BS No Clubbing cyanosis   edema Skin-warm and dry A & Oriented  Grossly normal sensory and motor function  ECG sinus rhythm with P synchronous pacing with an upright QRS lead V1 and negative QRS lead I  Assessment and  Plan  Atrial fibrillation paroxysmal  Ischemic cardiomyopathy  Congestive heart failure-chronic systolic  CRT-D.-[employer]    Thromboembolic risk factors diabetes hypertension vascular idisease and CHF   CHADS-VASc score of greater than or equal to 4  RV lead--elevated threshold  Scant interval atrial fibrillation.  Self terminating.  on anticoagulation.  No bleeding  issues.  Interval atrial fibrillation  Without symptoms of ischemia  With an elevated RV threshold, now above 2.5 V at 1.5 ms, we will program himself thresholds on the RV lead.  ECG was repeated.-Upright QRS lead V1 negative QRS V1   He will let us know if he has any change in his symptoms.  This is associated with predicted battery longevity savings of more than 2 years.         Current medicines are reviewed at length with the patient today .  The patient does not  have concerns regarding medicines.

## 2018-12-14 NOTE — Progress Notes (Signed)
ekg 

## 2018-12-25 ENCOUNTER — Other Ambulatory Visit: Payer: Self-pay | Admitting: Internal Medicine

## 2019-01-06 ENCOUNTER — Other Ambulatory Visit: Payer: Self-pay

## 2019-01-06 NOTE — Progress Notes (Signed)
Remote ICD transmission.   

## 2019-01-08 ENCOUNTER — Ambulatory Visit: Payer: Managed Care, Other (non HMO) | Admitting: Endocrinology

## 2019-01-08 ENCOUNTER — Encounter: Payer: Self-pay | Admitting: Endocrinology

## 2019-01-08 VITALS — BP 100/62 | HR 114 | Ht 73.0 in | Wt 198.4 lb

## 2019-01-08 DIAGNOSIS — Z794 Long term (current) use of insulin: Secondary | ICD-10-CM

## 2019-01-08 DIAGNOSIS — E1051 Type 1 diabetes mellitus with diabetic peripheral angiopathy without gangrene: Secondary | ICD-10-CM

## 2019-01-08 DIAGNOSIS — N189 Chronic kidney disease, unspecified: Secondary | ICD-10-CM | POA: Diagnosis not present

## 2019-01-08 DIAGNOSIS — E1022 Type 1 diabetes mellitus with diabetic chronic kidney disease: Secondary | ICD-10-CM | POA: Diagnosis not present

## 2019-01-08 DIAGNOSIS — E1122 Type 2 diabetes mellitus with diabetic chronic kidney disease: Secondary | ICD-10-CM

## 2019-01-08 LAB — POCT GLYCOSYLATED HEMOGLOBIN (HGB A1C): Hemoglobin A1C: 6.9 % — AB (ref 4.0–5.6)

## 2019-01-08 MED ORDER — NOVOLOG FLEXPEN 100 UNIT/ML ~~LOC~~ SOPN: PEN_INJECTOR | SUBCUTANEOUS | 6 refills | Status: DC

## 2019-01-08 NOTE — Progress Notes (Signed)
Subjective:    Patient ID: Caleb Bell, male    DOB: [birth year], 63 y.o.   MRN: [id number]  HPI Pt returns for f/u of diabetes mellitus:  DM type: 1 Dx'ed: 123456 Complications: polyneuropathy, renal insuff, foot ulcer, retinopathy, PAD, and CAD.   Therapy: insulin since 2010.   DKA: never.   Severe hypoglycemia: never.    Pancreatitis: never.   Other: he takes multiple daily injections; he declines pump therapy; the pattern of his cbg's indicates he does not need basal insulin (prob due to renal insufficiency).    Interval history: pt states he feels well in general.  He brings his meter with his cbg's which I have reviewed today.  cbg varies from 57-221.  He seldom has hypoglycemia, and this was mild.  It happens in the afternoon, or at HS.  He is unable to say why this happened.  Past Medical History:  Diagnosis Date  . Atrial fibrillation (HCC)    Paroxysmal, limited  . CAD (coronary artery disease)    CABG 2003  . Chronic systolic heart failure (HCC)    EF 20-25% echo 5- 2011  . CRI (chronic renal insufficiency)     multifactorial... Dr. Posey Pronto.. consult September 18, 2009  . DM type 2 (diabetes mellitus, type 2) (HCC)    Dr Loanne Drilling  . Ejection fraction < 50%    EF 25%, echo, November, 2012  //   planning followup 2-D echo to assess LV function with CRT D.  device in place  . Elevated serum creatinine   . Epididymal cyst 2017   right, no testis masses  . Hodgkin's disease     Dx aprox 2004, s/p XRT-Chemo/had radiation too  . Hyperkalemia    August, 2011, Aldactone, Aldactone stopped  . Hyperlipidemia    low HDL  . Hypothyroidism   . ICD (implantable cardiac defibrillator) in place    CRT-D placed March, 2013  . Ischemic cardiomyopathy    MRI12/12 no real viability in hypo/akinetic segment  CAth native and graft disease  . IVCD (intraventricular conduction defect)   . Myocardial infarction ([place])    mild MI/ several MI per pt in the past.  . Personal history of  colonic adenomas 12/24/2011  . Polysubstance    Alcohol, cocaine- resoloved for many years     Past Surgical History:  Procedure Laterality Date  . BI-VENTRICULAR IMPLANTABLE CARDIOVERTER DEFIBRILLATOR N/A 04/01/2011   Procedure: BI-VENTRICULAR IMPLANTABLE CARDIOVERTER DEFIBRILLATOR  (CRT-D);  Surgeon: Deboraha Sprang, MD;  Location: [facility] CATH LAB;  Service: Cardiovascular;  Laterality: N/A;  . BIV ICD GENERATOR CHANGEOUT N/A 03/10/2017   Procedure: BIV ICD GENERATOR CHANGEOUT;  Surgeon: Deboraha Sprang, MD;  Location: [place] CV LAB;  Service: Cardiovascular;  Laterality: N/A;  . CARDIAC DEFIBRILLATOR PLACEMENT  03/2011  . COLONOSCOPY  12/24/2011   Procedure: COLONOSCOPY;  Surgeon: [name], MD;  Location: [facility] ENDOSCOPY;  Service: Endoscopy;  Laterality: N/A;  . COLONOSCOPY WITH PROPOFOL N/A 06/30/2017   Procedure: COLONOSCOPY WITH PROPOFOL;  Surgeon: [name], MD;  Location: [facility] ENDOSCOPY;  Service: Endoscopy;  Laterality: N/A;  . CORONARY ARTERY BYPASS GRAFT  2003  . CYSTECTOMY     Upper Back  . POLYPECTOMY  06/30/2017   Procedure: POLYPECTOMY;  Surgeon: [name], MD;  Location: [facility];  Service: Endoscopy;;  . TONSILLECTOMY      Social History   Socioeconomic History  . Marital status: Married    Spouse name: Not on  file  . Number of children: 0  . Years of education: Not on file  . Highest education level: Not on file  Occupational History  . Occupation: musician  Tobacco Use  . Smoking status: Never Smoker  . Smokeless tobacco: Never Used  Substance and Sexual Activity  . Alcohol use: No    Alcohol/week: 0.0 standard drinks    Comment: Hx of abuse/clean since 2003   . Drug use: No    Comment: Hx of abuse/clean for many years  . Sexual activity: Not on file  Other Topics Concern  . Not on file  Social History Narrative   Musician   Single, no children, lives by himself   Alcohol use-no (hx of abuse clean since 2003)      Drug use-no (hx of  abuse clean x years)     Social Determinants of Health   Financial Resource Strain:   . Difficulty of Paying Living Expenses: Not on file  Food Insecurity:   . Worried About Charity fundraiser in the Last Year: Not on file  . Ran Out of Food in the Last Year: Not on file  Transportation Needs:   . Lack of Transportation (Medical): Not on file  . Lack of Transportation (Non-Medical): Not on file  Physical Activity:   . Days of Exercise per Week: Not on file  . Minutes of Exercise per Session: Not on file  Stress:   . Feeling of Stress : Not on file  Social Connections:   . Frequency of Communication with Friends and Family: Not on file  . Frequency of Social Gatherings with Friends and Family: Not on file  . Attends Religious Services: Not on file  . Active Member of Clubs or Organizations: Not on file  . Attends Archivist Meetings: Not on file  . Marital Status: Not on file  Intimate Partner Violence:   . Fear of Current or Ex-Partner: Not on file  . Emotionally Abused: Not on file  . Physically Abused: Not on file  . Sexually Abused: Not on file    Current Outpatient Medications on File Prior to Visit  Medication Sig Dispense Refill  . acetaminophen (TYLENOL) 500 MG tablet Take 1,000 mg by mouth every 8 (eight) hours as needed for mild pain or headache.    Marland Kitchen atorvastatin (LIPITOR) 80 MG tablet Take 80 mg by mouth at bedtime.   6  . BAYER MICROLET LANCETS lancets USE TO CHECK BLOOD SUGAR 2 TIMES PER DAY. 100 each 12  . benazepril (LOTENSIN) 5 MG tablet Take 5 mg by mouth daily.    . carvedilol (COREG) 12.5 MG tablet TAKE 1 TABLET BY MOUTH TWICE DAILY WITH A MEAL, 180 tablet 2  . CONTOUR NEXT TEST test strip USE TO CHECK BLOOD SUGAR TWICE DAILY AS DIRECTED 100 each 2  . digoxin (LANOXIN) 0.125 MG tablet TAKE ONE TABLET BY MOUTH ONE TIME DAILY. 90 tablet 1  . ELIQUIS 5 MG TABS tablet TAKE 1 TABLET(5 MG) BY MOUTH TWICE DAILY 180 tablet 1  . furosemide (LASIX) 20 MG  tablet Take 20 mg by mouth daily.    Marland Kitchen levothyroxine (SYNTHROID, LEVOTHROID) 88 MCG tablet Take 1 tablet (88 mcg total) by mouth daily before breakfast. 30 tablet 0  . NOVOFINE 32G X 6 MM MISC TEST THREE TIMES DAILY AS DIRECTED 100 each 5  . spironolactone (ALDACTONE) 25 MG tablet TAKE 1/2 TABLET BY MOUTH DAILY 45 tablet 0   No current facility-administered medications  on file prior to visit.    No Known Allergies  Family History  Adopted: Yes  Family history unknown: Yes    BP 100/62 (BP Location: Left Arm, Patient Position: Sitting, Cuff Size: Normal)   Pulse (!) 114   Ht 6\' 1"  (1.854 m)   Wt 198 lb 6.4 oz (90 kg)   SpO2 95%   BMI 26.18 kg/m    Review of Systems Denies LOC    Objective:   Physical Exam VITAL SIGNS:  See vs page GENERAL: no distress Pulses: dorsalis pedis intact bilat.   MSK: no deformity of the feet CV: no leg edema Skin:  no ulcer on the feet, but there are heavy calluses.  normal color and temp on the feet.  Neuro: sensation is intact to touch on the feet.     Lab Results  Component Value Date   HGBA1C 6.9 (A) 01/08/2019   Lab Results  Component Value Date   CREATININE 1.53 (H) 10/09/2018   BUN 37 (H) 10/09/2018   NA 135 10/09/2018   K 4.7 10/09/2018   CL 95 (L) 10/09/2018   CO2 26 10/09/2018      Assessment & Plan:  Type 1 DM, with PAD: well-controlled.  I offered additional oral rx.  Pt says he'll consider Hypoglycemia: this limits aggressiveness of glycemic control Renal insuff: This limits rx options  Patient Instructions  Please reduce the lunch insulin to 16 units. check your blood sugar twice a day.  vary the time of day when you check, between before the 3 meals, and at bedtime.  also check if you have symptoms of your blood sugar being too high or too low.  please keep a record of the readings and bring it to your next appointment here (or you can bring the meter itself).  You can write it on any piece of paper.  please call us  sooner if your blood sugar goes below 70, or if you have a lot of readings over 200. Please come back for a follow-up appointment in 3-4 months.  another option is to add a pill called "Rybelsus." if you took this, we would reduce the insulin, to make up for the effect of this.

## 2019-01-08 NOTE — Patient Instructions (Signed)
Please reduce the lunch insulin to 16 units. check your blood sugar twice a day.  vary the time of day when you check, between before the 3 meals, and at bedtime.  also check if you have symptoms of your blood sugar being too high or too low.  please keep a record of the readings and bring it to your next appointment here (or you can bring the meter itself).  You can write it on any piece of paper.  please call us sooner if your blood sugar goes below 70, or if you have a lot of readings over 200. Please come back for a follow-up appointment in 3-4 months.  another option is to add a pill called "Rybelsus." if you took this, we would reduce the insulin, to make up for the effect of this.

## 2019-02-01 ENCOUNTER — Other Ambulatory Visit: Payer: Self-pay | Admitting: Internal Medicine

## 2019-02-26 ENCOUNTER — Other Ambulatory Visit: Payer: Self-pay

## 2019-02-26 DIAGNOSIS — E1122 Type 2 diabetes mellitus with diabetic chronic kidney disease: Secondary | ICD-10-CM

## 2019-02-26 DIAGNOSIS — N183 Chronic kidney disease, stage 3 unspecified: Secondary | ICD-10-CM

## 2019-02-26 MED ORDER — NOVOFINE 32G X 6 MM MISC: 1.0000 | Freq: Three times a day (TID) | 2 refills | Status: DC

## 2019-02-26 MED ORDER — NOVOLOG FLEXPEN 100 UNIT/ML ~~LOC~~ SOPN: PEN_INJECTOR | SUBCUTANEOUS | 0 refills | Status: DC

## 2019-03-10 ENCOUNTER — Ambulatory Visit (INDEPENDENT_AMBULATORY_CARE_PROVIDER_SITE_OTHER): Payer: Managed Care, Other (non HMO) | Admitting: *Deleted

## 2019-03-10 DIAGNOSIS — I255 Ischemic cardiomyopathy: Secondary | ICD-10-CM

## 2019-03-11 LAB — CUP PACEART REMOTE DEVICE CHECK
Battery Remaining Longevity: 59 mo
Battery Remaining Percentage: 67 %
Battery Voltage: 2.95 V
Brady Statistic AP VP Percent: 83 %
Brady Statistic AP VS Percent: 2.4 %
Brady Statistic AS VP Percent: 9.1 %
Brady Statistic AS VS Percent: 1 %
Brady Statistic RA Percent Paced: 79 %
Date Time Interrogation Session: 20210217234035
HighPow Impedance: 65 Ohm
HighPow Impedance: 65 Ohm
Implantable Lead Implant Date: 20130311
Implantable Lead Implant Date: 20130311
Implantable Lead Implant Date: 20130311
Implantable Lead Location: 753858
Implantable Lead Location: 753859
Implantable Lead Location: 753860
Implantable Lead Model: 7122
Implantable Pulse Generator Implant Date: 20190218
Lead Channel Impedance Value: 380 Ohm
Lead Channel Impedance Value: 380 Ohm
Lead Channel Impedance Value: 760 Ohm
Lead Channel Pacing Threshold Amplitude: 1 V
Lead Channel Pacing Threshold Amplitude: 1.125 V
Lead Channel Pacing Threshold Amplitude: 2.75 V
Lead Channel Pacing Threshold Pulse Width: 0.5 ms
Lead Channel Pacing Threshold Pulse Width: 0.5 ms
Lead Channel Pacing Threshold Pulse Width: 1.5 ms
Lead Channel Sensing Intrinsic Amplitude: 12 mV
Lead Channel Sensing Intrinsic Amplitude: 4.3 mV
Lead Channel Setting Pacing Amplitude: 0.5 V
Lead Channel Setting Pacing Amplitude: 2 V
Lead Channel Setting Pacing Amplitude: 2.125
Lead Channel Setting Pacing Pulse Width: 0.4 ms
Lead Channel Setting Pacing Pulse Width: 0.5 ms
Lead Channel Setting Sensing Sensitivity: 0.5 mV
Pulse Gen Serial Number: 9780477

## 2019-03-11 NOTE — Progress Notes (Signed)
ICD Remote  

## 2019-03-15 ENCOUNTER — Ambulatory Visit: Payer: Managed Care, Other (non HMO) | Attending: Family

## 2019-03-15 DIAGNOSIS — Z23 Encounter for immunization: Secondary | ICD-10-CM | POA: Insufficient documentation

## 2019-03-15 NOTE — Progress Notes (Signed)
   Covid-19 Vaccination Clinic  Name:  Caleb Bell    MRN: [id number] DOB: 03/20/1955  03/15/2019  [name] was observed post Covid-19 immunization for 15 minutes without incidence. He was provided with Vaccine Information Sheet and instruction to access the V-Safe system.   [name] was instructed to call 911 with any severe reactions post vaccine: Marland Kitchen Difficulty breathing  . Swelling of your face and throat  . A fast heartbeat  . A bad rash all over your body  . Dizziness and weakness    Immunizations Administered    Name Date Dose VIS Date Route   Moderna COVID-19 Vaccine 03/15/2019  2:17 PM 0.5 mL 12/22/2018 Intramuscular   Manufacturer: Moderna   Lot: [id number]   [id number]

## 2019-04-13 ENCOUNTER — Ambulatory Visit: Payer: Managed Care, Other (non HMO) | Attending: Family

## 2019-04-13 DIAGNOSIS — Z23 Encounter for immunization: Secondary | ICD-10-CM

## 2019-04-13 NOTE — Progress Notes (Signed)
   Covid-19 Vaccination Clinic  Name:  CAMILO LIBENGOOD    MRN: [id number] DOB: 07/19/1955  04/13/2019  Mr. Stoneking was observed post Covid-19 immunization for 15 minutes without incident. He was provided with Vaccine Information Sheet and instruction to access the V-Safe system.   Mr. Badley was instructed to call 911 with any severe reactions post vaccine: Marland Kitchen Difficulty breathing  . Swelling of face and throat  . A fast heartbeat  . A bad rash all over body  . Dizziness and weakness   Immunizations Administered    Name Date Dose VIS Date Route   Moderna COVID-19 Vaccine 04/13/2019  9:53 AM 0.5 mL 12/22/2018 Intramuscular   Manufacturer: Moderna   Lot: [id number]   [id number]

## 2019-04-27 ENCOUNTER — Ambulatory Visit: Payer: Managed Care, Other (non HMO)

## 2019-05-12 ENCOUNTER — Other Ambulatory Visit: Payer: Self-pay

## 2019-05-14 ENCOUNTER — Encounter: Payer: Self-pay | Admitting: Endocrinology

## 2019-05-14 ENCOUNTER — Ambulatory Visit: Payer: Managed Care, Other (non HMO) | Admitting: Endocrinology

## 2019-05-14 ENCOUNTER — Other Ambulatory Visit: Payer: Self-pay

## 2019-05-14 VITALS — BP 112/70 | HR 70 | Ht 73.0 in | Wt 200.6 lb

## 2019-05-14 DIAGNOSIS — N183 Chronic kidney disease, stage 3 unspecified: Secondary | ICD-10-CM

## 2019-05-14 DIAGNOSIS — N1831 Chronic kidney disease, stage 3a: Secondary | ICD-10-CM

## 2019-05-14 DIAGNOSIS — E1051 Type 1 diabetes mellitus with diabetic peripheral angiopathy without gangrene: Secondary | ICD-10-CM | POA: Diagnosis not present

## 2019-05-14 DIAGNOSIS — Z794 Long term (current) use of insulin: Secondary | ICD-10-CM

## 2019-05-14 DIAGNOSIS — N189 Chronic kidney disease, unspecified: Secondary | ICD-10-CM

## 2019-05-14 DIAGNOSIS — E1022 Type 1 diabetes mellitus with diabetic chronic kidney disease: Secondary | ICD-10-CM | POA: Diagnosis not present

## 2019-05-14 DIAGNOSIS — E1122 Type 2 diabetes mellitus with diabetic chronic kidney disease: Secondary | ICD-10-CM

## 2019-05-14 LAB — POCT GLYCOSYLATED HEMOGLOBIN (HGB A1C): Hemoglobin A1C: 7.3 % — AB (ref 4.0–5.6)

## 2019-05-14 MED ORDER — CONTOUR NEXT TEST VI STRP: 1.0000 | ORAL_STRIP | Freq: Two times a day (BID) | 3 refills | Status: DC

## 2019-05-14 MED ORDER — NOVOLOG FLEXPEN 100 UNIT/ML ~~LOC~~ SOPN: PEN_INJECTOR | SUBCUTANEOUS | 0 refills | Status: DC

## 2019-05-14 NOTE — Patient Instructions (Addendum)
Please change the insulin to the numbers listed below.   check your blood sugar twice a day.  vary the time of day when you check, between before the 3 meals, and at bedtime.  also check if you have symptoms of your blood sugar being too high or too low.  please keep a record of the readings and bring it to your next appointment here (or you can bring the meter itself).  You can write it on any piece of paper.  please call us sooner if your blood sugar goes below 70, or if you have a lot of readings over 200. Please come back for a follow-up appointment in 3-4 months.

## 2019-05-14 NOTE — Progress Notes (Signed)
Subjective:    Patient ID: Caleb Bell, male    DOB: May 16, 1955, 64 y.o.   MRN: [id number]  HPI Pt returns for f/u of diabetes mellitus:  DM type: 1 Dx'ed: 123456 Complications: PN, CRI, foot ulcer, DR, PAD, and CAD.   Therapy: insulin since 2010.   DKA: never.   Severe hypoglycemia: never.    Pancreatitis: never.   Other: he takes multiple daily injections; he declines pump therapy; the pattern of his cbg's indicates he does not need basal insulin (prob due to renal insufficiency); he declines to add oral rx  Interval history: pt states he feels well in general.  Meter is downloaded today, and the printout is scanned into the record.  cbg varies from 147-265.  He seldom has hypoglycemia, and these episodes are mild.  It happens in the afternoon.  He is unable to say why this happened.  Past Medical History:  Diagnosis Date  . Atrial fibrillation (HCC)    Paroxysmal, limited  . CAD (coronary artery disease)    CABG 2003  . Chronic systolic heart failure (HCC)    EF 20-25% echo 5- 2011  . CRI (chronic renal insufficiency)     multifactorial... Dr. Posey Pronto.. consult September 18, 2009  . DM type 2 (diabetes mellitus, type 2) (HCC)    Dr Loanne Drilling  . Ejection fraction < 50%    EF 25%, echo, November, 2012  //   planning followup 2-D echo to assess LV function with CRT D.  device in place  . Elevated serum creatinine   . Epididymal cyst 2017   right, no testis masses  . Hodgkin's disease     Dx aprox 2004, s/p XRT-Chemo/had radiation too  . Hyperkalemia    August, 2011, Aldactone, Aldactone stopped  . Hyperlipidemia    low HDL  . Hypothyroidism   . ICD (implantable cardiac defibrillator) in place    CRT-D placed March, 2013  . Ischemic cardiomyopathy    MRI12/12 no real viability in hypo/akinetic segment  CAth native and graft disease  . IVCD (intraventricular conduction defect)   . Myocardial infarction ([place])    mild MI/ several MI per pt in the past.  . Personal history of  colonic adenomas 12/24/2011  . Polysubstance    Alcohol, cocaine- resoloved for many years     Past Surgical History:  Procedure Laterality Date  . BI-VENTRICULAR IMPLANTABLE CARDIOVERTER DEFIBRILLATOR N/A 04/01/2011   Procedure: BI-VENTRICULAR IMPLANTABLE CARDIOVERTER DEFIBRILLATOR  (CRT-D);  Surgeon: Deboraha Sprang, MD;  Location: [facility] CATH LAB;  Service: Cardiovascular;  Laterality: N/A;  . BIV ICD GENERATOR CHANGEOUT N/A 03/10/2017   Procedure: BIV ICD GENERATOR CHANGEOUT;  Surgeon: Deboraha Sprang, MD;  Location: [place] CV LAB;  Service: Cardiovascular;  Laterality: N/A;  . CARDIAC DEFIBRILLATOR PLACEMENT  03/2011  . COLONOSCOPY  12/24/2011   Procedure: COLONOSCOPY;  Surgeon: [name], MD;  Location: [facility];  Service: Endoscopy;  Laterality: N/A;  . COLONOSCOPY WITH PROPOFOL N/A 06/30/2017   Procedure: COLONOSCOPY WITH PROPOFOL;  Surgeon: [name], MD;  Location: [facility] ENDOSCOPY;  Service: Endoscopy;  Laterality: N/A;  . CORONARY ARTERY BYPASS GRAFT  2003  . CYSTECTOMY     Upper Back  . POLYPECTOMY  06/30/2017   Procedure: POLYPECTOMY;  Surgeon: [name], MD;  Location: [facility];  Service: Endoscopy;;  . TONSILLECTOMY      Social History   Socioeconomic History  . Marital status: Married    Spouse name: Not  on file  . Number of children: 0  . Years of education: Not on file  . Highest education level: Not on file  Occupational History  . Occupation: musician  Tobacco Use  . Smoking status: Never Smoker  . Smokeless tobacco: Never Used  Substance and Sexual Activity  . Alcohol use: No    Alcohol/week: 0.0 standard drinks    Comment: Hx of abuse/clean since 2003   . Drug use: No    Comment: Hx of abuse/clean for many years  . Sexual activity: Not on file  Other Topics Concern  . Not on file  Social History Narrative   Musician   Single, no children, lives by himself   Alcohol use-no (hx of abuse clean since 2003)      Drug use-no (hx of  abuse clean x years)     Social Determinants of Health   Financial Resource Strain:   . Difficulty of Paying Living Expenses:   Food Insecurity:   . Worried About Charity fundraiser in the Last Year:   . Arboriculturist in the Last Year:   Transportation Needs:   . Film/video editor (Medical):   Marland Kitchen Lack of Transportation (Non-Medical):   Physical Activity:   . Days of Exercise per Week:   . Minutes of Exercise per Session:   Stress:   . Feeling of Stress :   Social Connections:   . Frequency of Communication with Friends and Family:   . Frequency of Social Gatherings with Friends and Family:   . Attends Religious Services:   . Active Member of Clubs or Organizations:   . Attends Archivist Meetings:   Marland Kitchen Marital Status:   Intimate Partner Violence:   . Fear of Current or Ex-Partner:   . Emotionally Abused:   Marland Kitchen Physically Abused:   . Sexually Abused:     Current Outpatient Medications on File Prior to Visit  Medication Sig Dispense Refill  . acetaminophen (TYLENOL) 500 MG tablet Take 1,000 mg by mouth every 8 (eight) hours as needed for mild pain or headache.    Marland Kitchen atorvastatin (LIPITOR) 80 MG tablet Take 80 mg by mouth at bedtime.   6  . BAYER MICROLET LANCETS lancets USE TO CHECK BLOOD SUGAR 2 TIMES PER DAY. 100 each 12  . benazepril (LOTENSIN) 5 MG tablet Take 5 mg by mouth daily.    . carvedilol (COREG) 12.5 MG tablet TAKE 1 TABLET BY MOUTH TWICE DAILY WITH A MEAL, 180 tablet 2  . digoxin (LANOXIN) 0.125 MG tablet TAKE ONE TABLET BY MOUTH ONE TIME DAILY. 90 tablet 1  . ELIQUIS 5 MG TABS tablet TAKE 1 TABLET(5 MG) BY MOUTH TWICE DAILY 180 tablet 1  . furosemide (LASIX) 20 MG tablet Take 20 mg by mouth daily.    . Insulin Pen Needle (NOVOFINE) 32G X 6 MM MISC 1 each by Other route 3 (three) times daily. E11.9 100 each 2  . levothyroxine (SYNTHROID, LEVOTHROID) 88 MCG tablet Take 1 tablet (88 mcg total) by mouth daily before breakfast. 30 tablet 0  .  spironolactone (ALDACTONE) 25 MG tablet TAKE 1/2 TABLET BY MOUTH DAILY 45 tablet 3   No current facility-administered medications on file prior to visit.    No Known Allergies  Family History  Adopted: Yes  Family history unknown: Yes    BP 112/70 (BP Location: Left Arm, Patient Position: Sitting, Cuff Size: Normal)   Pulse 70   Ht 6\' 1"  (1.854  m)   Wt 200 lb 9.6 oz (91 kg)   SpO2 98%   BMI 26.47 kg/m    Review of Systems Denies LOC    Objective:   Physical Exam VITAL SIGNS:  See vs page GENERAL: no distress Pulses: dorsalis pedis intact bilat.   MSK: no deformity of the feet CV: no leg edema Skin:  no ulcer on the feet.  normal color and temp on the feet. Neuro: sensation is intact to touch on the feet. Ext: there is bilateral onychomycosis of the toenails.    Lab Results  Component Value Date   CREATININE 1.53 (H) 10/09/2018   BUN 37 (H) 10/09/2018   NA 135 10/09/2018   K 4.7 10/09/2018   CL 95 (L) 10/09/2018   CO2 26 10/09/2018     Lab Results  Component Value Date   HGBA1C 7.3 (A) 05/14/2019       Assessment & Plan:  Type 1 DM, with PAD: worse.  Hypoglycemia: this limits aggressiveness of glycemic control CRI: in this setting, he does not need basal insulin.  Patient Instructions  Please change the insulin to the numbers listed below.   check your blood sugar twice a day.  vary the time of day when you check, between before the 3 meals, and at bedtime.  also check if you have symptoms of your blood sugar being too high or too low.  please keep a record of the readings and bring it to your next appointment here (or you can bring the meter itself).  You can write it on any piece of paper.  please call us sooner if your blood sugar goes below 70, or if you have a lot of readings over 200. Please come back for a follow-up appointment in 3-4 months.

## 2019-05-22 ENCOUNTER — Other Ambulatory Visit: Payer: Self-pay | Admitting: Internal Medicine

## 2019-05-24 NOTE — Telephone Encounter (Signed)
Pt last saw Dr Caryl Comes 12/14/18, last labs 10/09/18 Creat 1.53, age 64, weight 91kg, based on specified criteria pt is on appropriate dosage of Eliquis 5mg  BID.  Will refill rx.

## 2019-05-27 ENCOUNTER — Other Ambulatory Visit: Payer: Self-pay | Admitting: Endocrinology

## 2019-05-27 DIAGNOSIS — N183 Chronic kidney disease, stage 3 unspecified: Secondary | ICD-10-CM

## 2019-05-27 DIAGNOSIS — E1122 Type 2 diabetes mellitus with diabetic chronic kidney disease: Secondary | ICD-10-CM

## 2019-06-09 ENCOUNTER — Ambulatory Visit (INDEPENDENT_AMBULATORY_CARE_PROVIDER_SITE_OTHER): Payer: Managed Care, Other (non HMO) | Admitting: *Deleted

## 2019-06-09 DIAGNOSIS — I480 Paroxysmal atrial fibrillation: Secondary | ICD-10-CM

## 2019-06-09 DIAGNOSIS — I255 Ischemic cardiomyopathy: Secondary | ICD-10-CM

## 2019-06-09 LAB — CUP PACEART REMOTE DEVICE CHECK
Battery Remaining Longevity: 56 mo
Battery Remaining Percentage: 63 %
Battery Voltage: 2.95 V
Brady Statistic AP VP Percent: 83 %
Brady Statistic AP VS Percent: 2.2 %
Brady Statistic AS VP Percent: 10 %
Brady Statistic AS VS Percent: 1 %
Brady Statistic RA Percent Paced: 76 %
Date Time Interrogation Session: 20210519040017
HighPow Impedance: 70 Ohm
HighPow Impedance: 70 Ohm
Implantable Lead Implant Date: 20130311
Implantable Lead Implant Date: 20130311
Implantable Lead Implant Date: 20130311
Implantable Lead Location: 753858
Implantable Lead Location: 753859
Implantable Lead Location: 753860
Implantable Lead Model: 7122
Implantable Pulse Generator Implant Date: 20190218
Lead Channel Impedance Value: 380 Ohm
Lead Channel Impedance Value: 380 Ohm
Lead Channel Impedance Value: 750 Ohm
Lead Channel Pacing Threshold Amplitude: 1 V
Lead Channel Pacing Threshold Amplitude: 1.125 V
Lead Channel Pacing Threshold Amplitude: 2.75 V
Lead Channel Pacing Threshold Pulse Width: 0.5 ms
Lead Channel Pacing Threshold Pulse Width: 0.5 ms
Lead Channel Pacing Threshold Pulse Width: 1.5 ms
Lead Channel Sensing Intrinsic Amplitude: 11.9 mV
Lead Channel Sensing Intrinsic Amplitude: 4.6 mV
Lead Channel Setting Pacing Amplitude: 0.5 V
Lead Channel Setting Pacing Amplitude: 2 V
Lead Channel Setting Pacing Amplitude: 2.125
Lead Channel Setting Pacing Pulse Width: 0.4 ms
Lead Channel Setting Pacing Pulse Width: 0.5 ms
Lead Channel Setting Sensing Sensitivity: 0.5 mV
Pulse Gen Serial Number: 9780477

## 2019-06-10 NOTE — Progress Notes (Signed)
Remote ICD transmission.   

## 2019-07-15 LAB — BASIC METABOLIC PANEL
BUN: 33 — AB (ref 4–21)
Creatinine: 1.7 — AB (ref 0.6–1.3)

## 2019-07-21 ENCOUNTER — Encounter: Payer: Self-pay | Admitting: Internal Medicine

## 2019-07-21 ENCOUNTER — Ambulatory Visit: Payer: Managed Care, Other (non HMO) | Admitting: Internal Medicine

## 2019-07-21 VITALS — BP 90/58 | HR 76 | Ht 73.0 in | Wt 199.0 lb

## 2019-07-21 DIAGNOSIS — K8689 Other specified diseases of pancreas: Secondary | ICD-10-CM | POA: Diagnosis not present

## 2019-07-21 NOTE — Assessment & Plan Note (Signed)
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  Subsequently 2019 2020 and 2021 it remains at 5 mm without signs of mass lesion.  He can stop imaging for this.  Perhaps it was related to some prior pancreatic trauma when he was a heavy alcohol user but there are no signs of malignancy or anything worrisome which should have shown up by now.  He will see me as needed and for routine colonoscopy surveillance because of a history of colon polyps.

## 2019-07-21 NOTE — Patient Instructions (Addendum)
Normal BMI (Body Mass Index- based on height and weight) is between 19 and 25. Your BMI today is Body mass index is 26.25 kg/m. Marland Kitchen Please consider follow up  regarding your BMI with your Primary Care Provider.   Follow up with Dr Carlean Purl as needed.   We have you in our system for 06/2022 for your colonoscopy recall.   I appreciate the opportunity to care for you. Silvano Rusk, MD, [facility]

## 2019-07-21 NOTE — Progress Notes (Signed)
Caleb Bell 64 y.o. 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  Subsequently 2019 2020 and 2021 it remains at 5 mm without signs of mass lesion.  He can stop imaging for this.  Perhaps it was related to some prior pancreatic trauma when he was a heavy alcohol user but there are no signs of malignancy or anything worrisome which should have shown up by now.  He will see me as needed and for routine colonoscopy surveillance because of a history of colon polyps.    I appreciate the opportunity to care for this patient. CC: Lavone Orn, MD Dr. Phebe Colla  Subjective:   Chief Complaint: Dilated pancreatic duct  HPI Caleb Bell is here for a recall that is a bit overdue, regarding a dilated pancreatic duct that was found in 2018 on a CT scan, 5 mm.  He has an AICD and cannot get MR imaging we decided to follow with pancreatic protocol CT scans and in 2019 it was stable he was supposed to come back to me in 2020 but he did have 2020 and then another CT scan just this week or last week on the 24th that demonstrated chronic stability of a 5 mm mildly dilated main pancreatic duct without any mass lesions.  His kidney lesion is slightly enlarged and Dr. Tresa Moore is following that but it still in observation mode.  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 No Known Allergies Current Meds  Medication Sig  . acetaminophen (TYLENOL) 500 MG tablet Take 1,000 mg by mouth every 8 (eight) hours as needed for mild pain or headache.  Marland Kitchen atorvastatin (LIPITOR) 80 MG tablet Take 80 mg by mouth at bedtime.   Marland Kitchen BAYER MICROLET LANCETS lancets USE TO CHECK BLOOD SUGAR 2 TIMES PER DAY.  . benazepril (LOTENSIN) 5 MG tablet Take 5 mg by mouth daily.  . carvedilol (COREG) 12.5 MG  tablet TAKE 1 TABLET BY MOUTH TWICE DAILY WITH A MEAL,  . digoxin (LANOXIN) 0.125 MG tablet TAKE ONE TABLET BY MOUTH ONE TIME DAILY.  Marland Kitchen ELIQUIS 5 MG TABS tablet TAKE 1 TABLET(5 MG) BY MOUTH TWICE DAILY  . furosemide (LASIX) 20 MG tablet Take 20 mg by mouth daily.  Marland Kitchen glucose blood (CONTOUR NEXT TEST) test strip 1 each by Other route in the morning and at bedtime. And lancets 2/day  . insulin aspart (NOVOLOG FLEXPEN) 100 UNIT/ML FlexPen INJECT INTO THE SKIN THREE TIMES DAILY(JUST BEFORE EACH MEAL) 17-16-35  . Insulin Pen Needle (NOVOFINE) 32G X 6 MM MISC 1 each by Other route 3 (three) times daily. E11.9  . levothyroxine (SYNTHROID, LEVOTHROID) 88 MCG tablet Take 1 tablet (88 mcg total) by mouth daily before breakfast.  . spironolactone (ALDACTONE) 25 MG tablet TAKE 1/2 TABLET BY MOUTH DAILY   Past Medical History:  Diagnosis Date  . Atrial fibrillation (HCC)    Paroxysmal, limited  . CAD (coronary artery disease)    CABG 2003  . Chronic systolic heart failure (HCC)    EF 20-25% echo 5- 2011  . CRI (chronic renal insufficiency)     multifactorial... Dr. Posey Pronto.. consult September 18, 2009  . DM type 2 (diabetes mellitus, type 2) (HCC)    Dr Loanne Drilling  . Ejection fraction < 50%    EF  25%, echo, November, 2012  //   planning followup 2-D echo to assess LV function with CRT D.  device in place  . Elevated serum creatinine   . Epididymal cyst 2017   right, no testis masses  . Hodgkin's disease     Dx aprox 2004, s/p XRT-Chemo/had radiation too  . Hyperkalemia    August, 2011, Aldactone, Aldactone stopped  . Hyperlipidemia    low HDL  . Hypothyroidism   . ICD (implantable cardiac defibrillator) in place    CRT-D placed March, 2013  . Ischemic cardiomyopathy    MRI12/12 no real viability in hypo/akinetic segment  CAth native and graft disease  . IVCD (intraventricular conduction defect)   . Myocardial infarction ([place])    mild MI/ several MI per pt in the past.  . Personal history of  colonic adenomas 12/24/2011  . Polysubstance    Alcohol, cocaine- resoloved for many years    Past Surgical History:  Procedure Laterality Date  . BI-VENTRICULAR IMPLANTABLE CARDIOVERTER DEFIBRILLATOR N/A 04/01/2011   Procedure: BI-VENTRICULAR IMPLANTABLE CARDIOVERTER DEFIBRILLATOR  (CRT-D);  Surgeon: Deboraha Sprang, MD;  Location: [facility] CATH LAB;  Service: Cardiovascular;  Laterality: N/A;  . BIV ICD GENERATOR CHANGEOUT N/A 03/10/2017   Procedure: BIV ICD GENERATOR CHANGEOUT;  Surgeon: Deboraha Sprang, MD;  Location: [place] CV LAB;  Service: Cardiovascular;  Laterality: N/A;  . CARDIAC DEFIBRILLATOR PLACEMENT  03/2011  . CATARACT EXTRACTION    . COLONOSCOPY  12/24/2011   Procedure: COLONOSCOPY;  Surgeon: [name], MD;  Location: [facility] ENDOSCOPY;  Service: Endoscopy;  Laterality: N/A;  . COLONOSCOPY WITH PROPOFOL N/A 06/30/2017   Procedure: COLONOSCOPY WITH PROPOFOL;  Surgeon: [name], MD;  Location: [facility] ENDOSCOPY;  Service: Endoscopy;  Laterality: N/A;  . CORONARY ARTERY BYPASS GRAFT  2003  . CYSTECTOMY     Upper Back  . POLYPECTOMY  06/30/2017   Procedure: POLYPECTOMY;  Surgeon: [name], MD;  Location: [facility];  Service: Endoscopy;;  . TONSILLECTOMY     Social History   Social History Narrative   Musician   Single, no children, lives by himself   Alcohol use-no (hx of abuse clean since 2003)      Drug use-no (hx of abuse clean x years)     He was adopted. Family history is unknown by patient.   Review of Systems As above  Objective:   Physical Exam BP (!) 90/58   Pulse 76   Ht 6\' 1"  (1.854 m)   Wt 199 lb (90.3 kg)   BMI 26.25 kg/m

## 2019-08-05 ENCOUNTER — Telehealth: Payer: Self-pay | Admitting: *Deleted

## 2019-08-05 NOTE — Telephone Encounter (Signed)
CRT-D alert received for ongoing AF since 08/03/19 at 02:32.  Spoke with Caleb Bell. He denies any symptoms or awareness of episode, reports he is able to climb stairs in his house without any issues. Reports he is still taking carvedilol, digoxin, and apixaban, though he misses evening dose of medications every few weeks.  Assisted with manual transmission. Confirmed back in AP/BiVP rhythm. AF episode was 2 days 7 hrs in duration. Encouraged compliance with cardiac medications and emphasized the importance of not missing any doses. Caleb Bell verbalizes understanding and appreciation of call. Aware he will receive call back if any new recommendations from Dr. Caryl Comes. He denies questions at this time.

## 2019-08-11 NOTE — Telephone Encounter (Signed)
Noted  

## 2019-09-08 ENCOUNTER — Ambulatory Visit (INDEPENDENT_AMBULATORY_CARE_PROVIDER_SITE_OTHER): Payer: Managed Care, Other (non HMO) | Admitting: *Deleted

## 2019-09-08 DIAGNOSIS — I255 Ischemic cardiomyopathy: Secondary | ICD-10-CM

## 2019-09-08 LAB — CUP PACEART REMOTE DEVICE CHECK
Battery Remaining Longevity: 53 mo
Battery Remaining Percentage: 60 %
Battery Voltage: 2.95 V
Brady Statistic AP VP Percent: 82 %
Brady Statistic AP VS Percent: 2 %
Brady Statistic AS VP Percent: 11 %
Brady Statistic AS VS Percent: 1 %
Brady Statistic RA Percent Paced: 74 %
Date Time Interrogation Session: 20210818040024
HighPow Impedance: 70 Ohm
HighPow Impedance: 70 Ohm
Implantable Lead Implant Date: 20130311
Implantable Lead Implant Date: 20130311
Implantable Lead Implant Date: 20130311
Implantable Lead Location: 753858
Implantable Lead Location: 753859
Implantable Lead Location: 753860
Implantable Lead Model: 7122
Implantable Pulse Generator Implant Date: 20190218
Lead Channel Impedance Value: 350 Ohm
Lead Channel Impedance Value: 350 Ohm
Lead Channel Impedance Value: 730 Ohm
Lead Channel Pacing Threshold Amplitude: 0.875 V
Lead Channel Pacing Threshold Amplitude: 0.875 V
Lead Channel Pacing Threshold Amplitude: 2.75 V
Lead Channel Pacing Threshold Pulse Width: 0.5 ms
Lead Channel Pacing Threshold Pulse Width: 0.5 ms
Lead Channel Pacing Threshold Pulse Width: 1.5 ms
Lead Channel Sensing Intrinsic Amplitude: 12 mV
Lead Channel Sensing Intrinsic Amplitude: 5 mV
Lead Channel Setting Pacing Amplitude: 0.5 V
Lead Channel Setting Pacing Amplitude: 1.875
Lead Channel Setting Pacing Amplitude: 2 V
Lead Channel Setting Pacing Pulse Width: 0.4 ms
Lead Channel Setting Pacing Pulse Width: 0.5 ms
Lead Channel Setting Sensing Sensitivity: 0.5 mV
Pulse Gen Serial Number: 9780477

## 2019-09-09 NOTE — Progress Notes (Signed)
Remote ICD transmission.   

## 2019-09-10 ENCOUNTER — Other Ambulatory Visit: Payer: Self-pay

## 2019-09-10 ENCOUNTER — Encounter: Payer: Self-pay | Admitting: Endocrinology

## 2019-09-10 ENCOUNTER — Ambulatory Visit: Payer: Managed Care, Other (non HMO) | Admitting: Endocrinology

## 2019-09-10 VITALS — BP 100/70 | HR 115 | Ht 73.0 in | Wt 200.8 lb

## 2019-09-10 DIAGNOSIS — E1122 Type 2 diabetes mellitus with diabetic chronic kidney disease: Secondary | ICD-10-CM

## 2019-09-10 DIAGNOSIS — E1121 Type 2 diabetes mellitus with diabetic nephropathy: Secondary | ICD-10-CM

## 2019-09-10 DIAGNOSIS — N183 Chronic kidney disease, stage 3 unspecified: Secondary | ICD-10-CM | POA: Diagnosis not present

## 2019-09-10 DIAGNOSIS — Z794 Long term (current) use of insulin: Secondary | ICD-10-CM

## 2019-09-10 DIAGNOSIS — N1831 Chronic kidney disease, stage 3a: Secondary | ICD-10-CM

## 2019-09-10 LAB — POCT GLYCOSYLATED HEMOGLOBIN (HGB A1C): Hemoglobin A1C: 7.4 % — AB (ref 4.0–5.6)

## 2019-09-10 MED ORDER — LEVEMIR FLEXTOUCH 100 UNIT/ML ~~LOC~~ SOPN: 3.0000 [IU] | PEN_INJECTOR | Freq: Every day | SUBCUTANEOUS | 11 refills | Status: DC

## 2019-09-10 MED ORDER — NOVOLOG FLEXPEN 100 UNIT/ML ~~LOC~~ SOPN: PEN_INJECTOR | SUBCUTANEOUS | 4 refills | Status: DC

## 2019-09-10 NOTE — Patient Instructions (Addendum)
I have sent a prescription to your pharmacy, to add "levemir" at bedtime, and: Please change the Novolog to the numbers listed below.   check your blood sugar twice a day.  vary the time of day when you check, between before the 3 meals, and at bedtime.  also check if you have symptoms of your blood sugar being too high or too low.  please keep a record of the readings and bring it to your next appointment here (or you can bring the meter itself).  You can write it on any piece of paper.  please call us sooner if your blood sugar goes below 70, or if you have a lot of readings over 200. Please come back for a follow-up appointment in 3-4 months.

## 2019-09-10 NOTE — Progress Notes (Signed)
Subjective:    Patient ID: Caleb Bell, male    DOB: 09/13/1955, 64 y.o.   MRN: [id number]  HPI Pt returns for f/u of diabetes mellitus:  DM type: 1 Dx'ed: 2725 Complications: PN, stage 3 CRI, foot ulcer, DR, PAD, and CAD.   Therapy: insulin since 2010.   DKA: never.   Severe hypoglycemia: never.    Pancreatitis: never.   Other: he takes multiple daily injections; he declines pump therapy; the pattern of his cbg's indicates he does not need basal insulin (prob due to renal insufficiency); he declines to add oral rx  Interval history: pt states he feels well in general.  Meter is downloaded today, and the printout is scanned into the record.  cbg varies from 107-199.  It is in general highest fasting.  He seldom has hypoglycemia, and these episodes are mild.  It happens in the afternoon, or at HS.  He is unable to say why this happened.  Past Medical History:  Diagnosis Date  . Atrial fibrillation (HCC)    Paroxysmal, limited  . CAD (coronary artery disease)    CABG 2003  . Chronic systolic heart failure (HCC)    EF 20-25% echo 5- 2011  . CRI (chronic renal insufficiency)     multifactorial... Dr. Posey Pronto.. consult September 18, 2009  . DM type 2 (diabetes mellitus, type 2) (HCC)    Dr Loanne Drilling  . Ejection fraction < 50%    EF 25%, echo, November, 2012  //   planning followup 2-D echo to assess LV function with CRT D.  device in place  . Elevated serum creatinine   . Epididymal cyst 2017   right, no testis masses  . Hodgkin's disease     Dx aprox 2004, s/p XRT-Chemo/had radiation too  . Hyperkalemia    August, 2011, Aldactone, Aldactone stopped  . Hyperlipidemia    low HDL  . Hypothyroidism   . ICD (implantable cardiac defibrillator) in place    CRT-D placed March, 2013  . Ischemic cardiomyopathy    MRI12/12 no real viability in hypo/akinetic segment  CAth native and graft disease  . IVCD (intraventricular conduction defect)   . Myocardial infarction ([place])    mild MI/  several MI per pt in the past.  . Personal history of colonic adenomas 12/24/2011  . Polysubstance    Alcohol, cocaine- resoloved for many years     Past Surgical History:  Procedure Laterality Date  . BI-VENTRICULAR IMPLANTABLE CARDIOVERTER DEFIBRILLATOR N/A 04/01/2011   Procedure: BI-VENTRICULAR IMPLANTABLE CARDIOVERTER DEFIBRILLATOR  (CRT-D);  Surgeon: Deboraha Sprang, MD;  Location: [facility] CATH LAB;  Service: Cardiovascular;  Laterality: N/A;  . BIV ICD GENERATOR CHANGEOUT N/A 03/10/2017   Procedure: BIV ICD GENERATOR CHANGEOUT;  Surgeon: Deboraha Sprang, MD;  Location: [place] CV LAB;  Service: Cardiovascular;  Laterality: N/A;  . CARDIAC DEFIBRILLATOR PLACEMENT  03/2011  . CATARACT EXTRACTION    . COLONOSCOPY  12/24/2011   Procedure: COLONOSCOPY;  Surgeon: [name], MD;  [facility]: [facility] END[facility];  Service: Endoscopy;  Laterality: N/A;  . COLONOSCOPY WITH PROPOFOL N/A 06/30/2017   Procedure: COLONOSCOPY WITH PROPOFOL;  Surgeon: [name], MD;  Location: [facility] ENDOSCOPY;  Service: Endoscopy;  Laterality: N/A;  . CORONARY ARTERY BYPASS GRAFT  2003  . CYSTECTOMY     Upper Back  . POLYPECTOMY  06/30/2017   Procedure: POLYPECTOMY;  Surgeon: [name], MD;  Location: [facility] ENDOSCOPY;  Service: Endoscopy;;  . TONSILLECTOMY  Social History   Socioeconomic History  . Marital status: Married    Spouse name: Not on file  . Number of children: 0  . Years of education: Not on file  . Highest education level: Not on file  Occupational History  . Occupation: musician  Tobacco Use  . Smoking status: Never Smoker  . Smokeless tobacco: Never Used  Vaping Use  . Vaping Use: Never used  Substance and Sexual Activity  . Alcohol use: No    Alcohol/week: 0.0 standard drinks    Comment: Hx of abuse/clean since 2003   . Drug use: No    Comment: Hx of abuse/clean for many years  . Sexual activity: Not on file  Other Topics Concern  . 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 Difficulty of Paying Living Expenses: Not on file  Food Insecurity:   . Worried About Charity fundraiser in the Last Year: Not on file  . Ran Out of Food in the Last Year: Not on file  Transportation Needs:   . Lack of Transportation (Medical): Not on file  . Lack of Transportation (Non-Medical): Not on file  Physical Activity:   . Days of Exercise per Week: Not on file  . Minutes of Exercise per Session: Not on file  Stress:   . Feeling of Stress : Not on file  Social Connections:   . Frequency of Communication with Friends and Family: Not on file  . Frequency of Social Gatherings with Friends and Family: Not on file  . Attends Religious Services: Not on file  . Active Member of Clubs or Organizations: Not on file  . Attends Archivist Meetings: Not on file  . Marital Status: Not on file  Intimate Partner Violence:   . Fear of Current or Ex-Partner: Not on file  . Emotionally Abused: Not on file  . Physically Abused: Not on file  . Sexually Abused: Not on file    Current Outpatient Medications on File Prior to Visit  Medication Sig Dispense Refill  . acetaminophen (TYLENOL) 500 MG tablet Take 1,000 mg by mouth every 8 (eight) hours as needed for mild pain or headache.    Marland Kitchen atorvastatin (LIPITOR) 80 MG tablet Take 80 mg by mouth at bedtime.   6  . BAYER MICROLET LANCETS lancets USE TO CHECK BLOOD SUGAR 2 TIMES PER DAY. (Patient taking differently: 1 each by Other route 2 (two) times daily. E11.9) 100 each 12  . benazepril (LOTENSIN) 5 MG tablet Take 5 mg by mouth daily.    . carvedilol (COREG) 12.5 MG tablet TAKE 1 TABLET BY MOUTH TWICE DAILY WITH A MEAL, 180 tablet 2  . digoxin (LANOXIN) 0.125 MG tablet TAKE ONE TABLET BY MOUTH ONE TIME DAILY. 90 tablet 1  .  ELIQUIS 5 MG TABS tablet TAKE 1 TABLET(5 MG) BY MOUTH TWICE DAILY 180 tablet 1  . furosemide (LASIX) 20 MG tablet Take 20 mg by mouth daily.    Marland Kitchen glucose blood (CONTOUR NEXT TEST) test strip 1 each by Other route in the morning and at bedtime. And lancets 2/day (Patient taking differently: 1 each by Other route in the morning and at bedtime. E11.9) 200 each  3  . Insulin Pen Needle (NOVOFINE) 32G X 6 MM MISC 1 each by Other route 3 (three) times daily. E11.9 100 each 2  . levothyroxine (SYNTHROID, LEVOTHROID) 88 MCG tablet Take 1 tablet (88 mcg total) by mouth daily before breakfast. 30 tablet 0  . spironolactone (ALDACTONE) 25 MG tablet TAKE 1/2 TABLET BY MOUTH DAILY 45 tablet 3   No current facility-administered medications on file prior to visit.    No Known Allergies  Family History  Adopted: Yes  Family history unknown: Yes    BP 100/70   Pulse (!) 115   Ht 6\' 1"  (1.854 m)   Wt 200 lb 12.8 oz (91.1 kg)   SpO2 96%   BMI 26.49 kg/m    Review of Systems Denies LOC    Objective:   Physical Exam VITAL SIGNS:  See vs page GENERAL: no distress Pulses: dorsalis pedis intact bilat.   MSK: no deformity of the feet CV: no leg edema Skin:  no ulcer on the feet.  normal color and temp on the feet. Neuro: sensation is intact to touch on the feet, but decreased from normal  Lab Results  Component Value Date   CREATININE 1.53 (H) 10/09/2018   BUN 37 (H) 10/09/2018   NA 135 10/09/2018   K 4.7 10/09/2018   CL 95 (L) 10/09/2018   CO2 26 10/09/2018   Lab Results  Component Value Date   HGBA1C 7.4 (A) 09/10/2019       Assessment & Plan:  Type 1 DM, with PAD: worse  Patient Instructions  I have sent a prescription to your pharmacy, to add "levemir" at bedtime, and: Please change the Novolog to the numbers listed below.   check your blood sugar twice a day.  vary the time of day when you check, between before the 3 meals, and at bedtime.  also check if you have symptoms of  your blood sugar being too high or too low.  please keep a record of the readings and bring it to your next appointment here (or you can bring the meter itself).  You can write it on any piece of paper.  please call us sooner if your blood sugar goes below 70, or if you have a lot of readings over 200. Please come back for a follow-up appointment in 3-4 months.

## 2019-10-09 ENCOUNTER — Other Ambulatory Visit: Payer: Self-pay | Admitting: Internal Medicine

## 2019-10-09 NOTE — Progress Notes (Signed)
Cardiology Office Note   Date:  10/11/2019   ID:  Caleb Bell, DOB [birth year], MRN [id number]  PCP:  Caleb Orn, MD  Cardiologist:   Caleb Carnes, MD   Pt presents for F/U of CAD and systolic CHF      History of Present Illness: Caleb Bell is a 64 y.o. male with a history of CAD, s/p CABG 2003), ischemic cardiomyopathy with  IVCD and then  CRT-D implantation (March 2013). He is followed by Caleb Bell in EP  He also has a history of PAF, renal insuff, Hodgkins dz (s/p chemo and XRT)   Echo in Feb 2019 LVEF 30 to 35%  Mid AS  RVEF normal   Carotid Dopplers 12/15 bilateral 40-59% stenosis  I saw the pt in the fall 2020   He was seen by Caleb Bell earlier this year.  The patient says he is doing good.  He is very active.  He has 3 flights of stairs to walk at home and does not have a problem with this.  No chest tightness, no shortness of breath, no palpitations, no lower extremity edema.  Current Outpatient Medications  Medication Sig Dispense Refill   acetaminophen (TYLENOL) 500 MG tablet Take 1,000 mg by mouth every 8 (eight) hours as needed for mild pain or headache.     atorvastatin (LIPITOR) 80 MG tablet Take 80 mg by mouth at bedtime.   6   BAYER MICROLET LANCETS lancets USE TO CHECK BLOOD SUGAR 2 TIMES PER DAY. (Patient taking differently: 1 each by Other route 2 (two) times daily. E11.9) 100 each 12   benazepril (LOTENSIN) 5 MG tablet Take 5 mg by mouth daily.     carvedilol (COREG) 12.5 MG tablet TAKE 1 TABLET BY MOUTH TWICE DAILY WITH A MEAL, 180 tablet 2   digoxin (LANOXIN) 0.125 MG tablet TAKE ONE TABLET BY MOUTH ONE TIME DAILY. 90 tablet 1   ELIQUIS 5 MG TABS tablet TAKE 1 TABLET(5 MG) BY MOUTH TWICE DAILY 180 tablet 1   furosemide (LASIX) 20 MG tablet Take 20 mg by mouth daily.     glucose blood (CONTOUR NEXT TEST) test strip 1 each by Other route in the morning and at bedtime. And lancets 2/day (Patient taking differently: 1 each by Other route in the  morning and at bedtime. E11.9) 200 each 3   insulin aspart (NOVOLOG FLEXPEN) 100 UNIT/ML FlexPen THREE TIMES DAILY(JUST BEFORE EACH MEAL) 17-15-35 units 60 mL 4   insulin detemir (LEVEMIR FLEXTOUCH) 100 UNIT/ML FlexPen Inject 3 Units into the skin at bedtime. 15 mL 11   Insulin Pen Needle (NOVOFINE) 32G X 6 MM MISC 1 each by Other route 3 (three) times daily. E11.9 100 each 2   levothyroxine (SYNTHROID, LEVOTHROID) 88 MCG tablet Take 1 tablet (88 mcg total) by mouth daily before breakfast. 30 tablet 0   spironolactone (ALDACTONE) 25 MG tablet TAKE 1/2 TABLET BY MOUTH DAILY 45 tablet 3   No current facility-administered medications for this visit.    Allergies:   Patient has no known allergies.   Past Medical History:  Diagnosis Date   Atrial fibrillation ([place])    Paroxysmal, limited   CAD (coronary artery disease)    CABG 2549   Chronic systolic heart failure (HCC)    EF 20-25% echo 5- 2011   CRI (chronic renal insufficiency)     multifactorial... Dr. Posey Pronto.. consult September 18, 2009   DM type 2 (diabetes mellitus, type 2) ([place])  Dr Loanne Drilling   Ejection fraction < 50%    EF 25%, echo, November, 2012  //   planning followup 2-D echo to assess LV function with CRT D.  device in place   Elevated serum creatinine    Epididymal cyst 2017   right, no testis masses   Hodgkin's disease     Dx aprox 2004, s/p XRT-Chemo/had radiation too   Hyperkalemia    August, 2011, Aldactone, Aldactone stopped   Hyperlipidemia    low HDL   Hypothyroidism    ICD (implantable cardiac defibrillator) in place    CRT-D placed March, 2013   Ischemic cardiomyopathy    MRI12/12 no real viability in hypo/akinetic segment  CAth native and graft disease   IVCD (intraventricular conduction defect)    Myocardial infarction ([place])    mild MI/ several MI per pt in the past.   Personal history of colonic adenomas 12/24/2011   Polysubstance    Alcohol, cocaine- resoloved for many years      Past Surgical History:  Procedure Laterality Date   BI-VENTRICULAR IMPLANTABLE CARDIOVERTER DEFIBRILLATOR N/A 04/01/2011   Procedure: BI-VENTRICULAR IMPLANTABLE CARDIOVERTER DEFIBRILLATOR  (CRT-D);  Surgeon: Deboraha Sprang, MD;  Location: [facility] CATH LAB;  Service: Cardiovascular;  Laterality: N/A;   BIV ICD GENERATOR CHANGEOUT N/A 03/10/2017   Procedure: BIV ICD GENERATOR CHANGEOUT;  Surgeon: Deboraha Sprang, MD;  Location: [place] CV LAB;  Service: Cardiovascular;  Laterality: N/A;   CARDIAC DEFIBRILLATOR PLACEMENT  03/2011   CATARACT EXTRACTION     COLONOSCOPY  12/24/2011   Procedure: COLONOSCOPY;  Surgeon: [name], MD;  [facility];  Service: Endoscopy;  Laterality: N/A;   COLONOSCOPY WITH PROPOFOL N/A 06/30/2017   Procedure: COLONOSCOPY WITH PROPOFOL;  Surgeon: [name], MD;  Location: [facility] ENDOS[facility];  Service: Endoscopy;  Laterality: N/A;   CORONARY ARTERY BYPASS GRAFT  2003   CYSTECTOMY     Upper Back   POLYPECTOMY  06/30/2017   Procedure: POLYPECTOMY;  Surgeon: [name], MD;  Location: [facility] ENDOSCOPY;  Service: Endoscopy;;   TONSILLECTOMY       Social History:  The patient  reports that he has never smoked. He has never used smokeless tobacco. He reports that he does not drink alcohol and does not use drugs.   Family History:  The patient's He was adopted. Family history is unknown by patient.    ROS:  Please see the history of present illness. All other systems are reviewed and  Negative to the above problem except as noted.    PHYSICAL EXAM: VS:  BP 124/84    Pulse 97    Ht 6\' 1"  (1.854 m)    Wt 199 lb 9.6 oz (90.5 kg)    SpO2 93%    BMI 26.33 kg/m   GEN: Well nourished, well developed, in no acute distress  HEENT: normal  Neck: No JVD soft murmur left carotid area Cardiac: RRR; grade 1/6 to 2/6 systolic murmur heard best at the left sternal border.  No S3.  No lower extremity edema. Respiratory:  clear to auscultation bilaterally,  normal work of breathing GI: soft, nontender, nondistended, + BS  No hepatomegaly  MS: no deformity Moving all extremities   Skin: warm and dry, no rash Neuro:  Strength and sensation are intact Psych: euthymic mood, full affect   EKG:  EKG is not ordered today    Lipid Panel    Component Value Date/Time   CHOL 156 10/09/2018 1535  TRIG 489 (H) 10/09/2018 1535   TRIG 306 (HH) 11/06/2005 1313   HDL 29 (L) 10/09/2018 1535   CHOLHDL 5.4 (H) 10/09/2018 1535   CHOLHDL 4 11/15/2013 0911   VLDL 33.8 11/15/2013 0911   LDLCALC 54 10/09/2018 1535   LDLDIRECT 66.3 11/11/2012 0932      Wt Readings from Last 3 Encounters:  10/11/19 199 lb 9.6 oz (90.5 kg)  09/10/19 200 lb 12.8 oz (91.1 kg)  07/21/19 199 lb (90.3 kg)      ASSESSMENT AND PLAN:  1  CAD  REmote CABG   very active.  No concern for chest pain or drop in his functional status.  Would follow. 2.  Chronic systolic CHF  S/p BiV ICD 3/13 with generator change in 2/19 last echo his LVEF was in the 3035% range.  Volume status looks good.I reviewed regimen with him   He has been on low dose digoxin   Leave for now    He is on benazepril  Never tried Delene Loll  I have given samples for him to try this instead of benazepril   I have asked pt t ocall/email with response  3.  HL   Keep on current meds he will have labs drawn in December at Dr. Delene Ruffini office.  4  PAF   Device shows short spurts   Recom:  anticoagulation (Eliquis).  Check CBC and electrolytes.  Note he has been on digoxin I will review.  Follow labs   4  DM  Followed in IM   5.  CV disease very mild plaquing carotids a couple years ago.  Continue on statin.  I will plan for follow-up in 1 years time.  Caleb Bell will see in 6 months  Signed, Caleb Carnes, MD  10/11/2019 9:20 AM    [address], [place], [place]  [address] Phone: [phone]; Fax: [phone]

## 2019-10-11 ENCOUNTER — Other Ambulatory Visit: Payer: Self-pay

## 2019-10-11 ENCOUNTER — Encounter: Payer: Self-pay | Admitting: Internal Medicine

## 2019-10-11 ENCOUNTER — Ambulatory Visit: Payer: Managed Care, Other (non HMO) | Admitting: Internal Medicine

## 2019-10-11 VITALS — BP 124/84 | HR 97 | Ht 73.0 in | Wt 199.6 lb

## 2019-10-11 DIAGNOSIS — I2510 Atherosclerotic heart disease of native coronary artery without angina pectoris: Secondary | ICD-10-CM

## 2019-10-11 MED ORDER — SACUBITRIL-VALSARTAN 24-26 MG PO TABS: 1.0000 | ORAL_TABLET | Freq: Two times a day (BID) | ORAL | 11 refills | Status: DC

## 2019-10-11 NOTE — Patient Instructions (Addendum)
Medication Instructions:  Your physician has recommended you make the following change in your medication:  1.) stop benazepril 2.) ON Thursday 10/14/19---start sacubitril-valsartan (Entresto) 24-26 mg --take 1 tablet every 12 hours  *If you need a refill on your cardiac medications before your next appointment, please call your pharmacy*   Lab Work: Per lab slip given today   Testing/Procedures: none   Follow-Up: At [facility], you and your health needs are our priority.  As part of our continuing mission to provide you with exceptional heart care, we have created designated Provider Care Teams.  These Care Teams include your primary Cardiologist (physician) and Advanced Practice Providers (APPs -  Physician Assistants and Nurse Practitioners) who all work together to provide you with the care you need, when you need it.     Your next appointment:   12 month(s)  The format for your next appointment:   In Person  Provider:   You may see Dorris Carnes, MD or one of the following Advanced Practice Providers on your designated Care Team:    Richardson Dopp, PA-C  Robbie Lis, [place]    Other Instructions

## 2019-10-12 ENCOUNTER — Telehealth: Payer: Self-pay

## 2019-10-12 NOTE — Telephone Encounter (Signed)
**Note De-Identified Caleb Bell Obfuscation** I started a Entresto PA through [employer]: Key: [id number]

## 2019-10-13 ENCOUNTER — Telehealth: Payer: Self-pay | Admitting: Internal Medicine

## 2019-10-13 NOTE — Telephone Encounter (Signed)
**Note De-Identified Ezell Poke Obfuscation** Letter received from [facility] fax stating that they have approved the pts [place] PA. Approval is valid from 10/12/2019 until 10/12/2019. Patient VW#:[id number]  I have notified [place] of this approval Marjoria Mancillas voice mail.

## 2019-10-13 NOTE — Telephone Encounter (Signed)
Spoke with Legrand Como, [facility] and made him aware that pt was instructed to d/c Benazepril and start Entresto on Thursday.  Legrand Como will remove Benazepril from list so it does not mistakenly get filled.

## 2019-10-13 NOTE — Telephone Encounter (Signed)
Pt c/o medication issue:  1. Name of Medication:  sacubitril-valsartan (ENTRESTO) 24-26 MG Benazepril 5mg   2. How are you currently taking this medication (dosage and times per day)?   Pharmacist states based on his profile the patient has not started Entresto yet Benazepril As directed   3. Are you having a reaction (difficulty breathing--STAT)? no  4. What is your medication issue? Pharmacist called and wanted to make Dr. Harrington Challenger aware that the patient is has been prescribed both of the medications. The pharmacist states there is a drug interaction between these two medications that increased the risk of angioedema. Please confirm with the pharmacist if the patient is still to take both medications

## 2019-10-27 ENCOUNTER — Telehealth: Payer: Self-pay | Admitting: Emergency Medicine

## 2019-10-27 NOTE — Telephone Encounter (Signed)
The pt called letting us know he sent his transmission in. I let him speak with device nurse Leigh, rn.

## 2019-10-27 NOTE — Telephone Encounter (Signed)
Alert received for AF episode that was ongoing from 10/3/21until to transmission 10/26/19 . Patient is on Eliquis and reports no missed doses. He reports feeling tired over the past 2 days with no CP, SOB, increased weight gain or edema. He started [facility] 10/13/19 and reports that he has not had any other medication changes. He will send a remote transmission after he finishes work today to confirm if AF is ongoing.

## 2019-10-27 NOTE — Telephone Encounter (Signed)
Patient is no longer in AF.  Presenting:AP/BVP 64

## 2019-11-08 ENCOUNTER — Other Ambulatory Visit: Payer: Self-pay | Admitting: Internal Medicine

## 2019-11-08 NOTE — Telephone Encounter (Signed)
Eliquis 5mg  refill request received. Patient is 64 years old, weight-90.5kg, Crea-1.7 on 07/15/2019, Diagnosis-Afib, and last seen by Dr. Harrington Challenger on 10/11/19. Dose is appropriate based on dosing criteria. Will send in refill to requested pharmacy.

## 2019-11-24 ENCOUNTER — Telehealth: Payer: Self-pay

## 2019-11-24 DIAGNOSIS — R002 Palpitations: Secondary | ICD-10-CM

## 2019-11-24 DIAGNOSIS — I4891 Unspecified atrial fibrillation: Secondary | ICD-10-CM

## 2019-11-24 NOTE — Telephone Encounter (Signed)
Merlin alert received 11/24/19. Presenting shows AS-BIV paced w/ freq PVC's causing P waves to fall in PVARP and not tracked resulting in decrease in BIV pacing percentage. Routing to Triage to evaluate shortening PVARP/PVC response off if no retrograde. Consulted with Dyanne Iha. Jude, recommends to turn off PVC response -->takes the refractory period out to 450 ms. Also, increase the base rate to 70 bpm.    Called patient to assess, states he has no complaints and has felt great. Advised patient I will forward to Dr. Caryl Comes for his recommendations prior to reprogramming. Patient has apt. With Dr. Caryl Comes 12/24/19, patient reminded of date, time and location.

## 2019-11-29 NOTE — Telephone Encounter (Signed)
I would repeat his transmission and if he is still in afib, would send to afib clinic for DCCV  Thanks SK

## 2019-11-30 NOTE — Telephone Encounter (Signed)
Called patient this morning and had him send over a transmission and it has came over successfully

## 2019-11-30 NOTE — Telephone Encounter (Signed)
Transmission received and reviewed that showed presenting EGM of AP/VS at 105 bpm. Patient took his Coreg and Digoxin approximately 40 minutes before transmission. Asymptomatic at this time.

## 2019-12-06 NOTE — Telephone Encounter (Signed)
C  thanks  remember that ApVs wouldnot be affected by his meds M can we order a 7 day ZIO prior to his visit to quantitate PVC Thanks SK

## 2019-12-07 ENCOUNTER — Telehealth: Payer: Self-pay | Admitting: Radiology

## 2019-12-07 NOTE — Telephone Encounter (Signed)
Spoke with pt and advised Dr Caryl Comes would like for pt to wear a 7 day Zio monitor d/t recent transmissions.  Pt verbalizes understanding and is agreeable. Order placed for monitor.

## 2019-12-07 NOTE — Telephone Encounter (Signed)
Enrolled patient for a 7 day Zio XT monitor to be mailed to patients home.  

## 2019-12-07 NOTE — Addendum Note (Signed)
Addended by: Thora Lance on: 12/07/2019 05:15 PM   Modules accepted: Orders

## 2019-12-08 ENCOUNTER — Ambulatory Visit (INDEPENDENT_AMBULATORY_CARE_PROVIDER_SITE_OTHER): Payer: Managed Care, Other (non HMO)

## 2019-12-08 DIAGNOSIS — I255 Ischemic cardiomyopathy: Secondary | ICD-10-CM | POA: Diagnosis not present

## 2019-12-08 LAB — CUP PACEART REMOTE DEVICE CHECK
Battery Remaining Longevity: 50 mo
Battery Remaining Percentage: 57 %
Battery Voltage: 2.95 V
Brady Statistic AP VP Percent: 82 %
Brady Statistic AP VS Percent: 1.9 %
Brady Statistic AS VP Percent: 11 %
Brady Statistic AS VS Percent: 1 %
Brady Statistic RA Percent Paced: 74 %
Date Time Interrogation Session: 20211117040018
HighPow Impedance: 66 Ohm
HighPow Impedance: 66 Ohm
Implantable Lead Implant Date: 20130311
Implantable Lead Implant Date: 20130311
Implantable Lead Implant Date: 20130311
Implantable Lead Location: 753858
Implantable Lead Location: 753859
Implantable Lead Location: 753860
Implantable Lead Model: 7122
Implantable Pulse Generator Implant Date: 20190218
Lead Channel Impedance Value: 350 Ohm
Lead Channel Impedance Value: 360 Ohm
Lead Channel Impedance Value: 790 Ohm
Lead Channel Pacing Threshold Amplitude: 0.875 V
Lead Channel Pacing Threshold Amplitude: 1 V
Lead Channel Pacing Threshold Amplitude: 2.75 V
Lead Channel Pacing Threshold Pulse Width: 0.5 ms
Lead Channel Pacing Threshold Pulse Width: 0.5 ms
Lead Channel Pacing Threshold Pulse Width: 1.5 ms
Lead Channel Sensing Intrinsic Amplitude: 11.9 mV
Lead Channel Sensing Intrinsic Amplitude: 5 mV
Lead Channel Setting Pacing Amplitude: 0.5 V
Lead Channel Setting Pacing Amplitude: 2 V
Lead Channel Setting Pacing Amplitude: 2 V
Lead Channel Setting Pacing Pulse Width: 0.4 ms
Lead Channel Setting Pacing Pulse Width: 0.5 ms
Lead Channel Setting Sensing Sensitivity: 0.5 mV
Pulse Gen Serial Number: 9780477

## 2019-12-09 NOTE — Progress Notes (Signed)
Remote ICD transmission.   

## 2019-12-11 ENCOUNTER — Other Ambulatory Visit (INDEPENDENT_AMBULATORY_CARE_PROVIDER_SITE_OTHER): Payer: Managed Care, Other (non HMO)

## 2019-12-11 DIAGNOSIS — I4891 Unspecified atrial fibrillation: Secondary | ICD-10-CM | POA: Diagnosis not present

## 2019-12-11 DIAGNOSIS — R002 Palpitations: Secondary | ICD-10-CM | POA: Diagnosis not present

## 2019-12-15 ENCOUNTER — Encounter: Payer: Managed Care, Other (non HMO) | Admitting: Internal Medicine

## 2019-12-18 ENCOUNTER — Other Ambulatory Visit: Payer: Self-pay | Admitting: Internal Medicine

## 2019-12-24 ENCOUNTER — Encounter: Payer: Self-pay | Admitting: Internal Medicine

## 2019-12-24 ENCOUNTER — Other Ambulatory Visit: Payer: Self-pay

## 2019-12-24 ENCOUNTER — Ambulatory Visit (INDEPENDENT_AMBULATORY_CARE_PROVIDER_SITE_OTHER): Payer: Managed Care, Other (non HMO) | Admitting: Internal Medicine

## 2019-12-24 VITALS — BP 94/58 | HR 80 | Ht 73.0 in | Wt 203.0 lb

## 2019-12-24 DIAGNOSIS — I5022 Chronic systolic (congestive) heart failure: Secondary | ICD-10-CM | POA: Diagnosis not present

## 2019-12-24 DIAGNOSIS — I480 Paroxysmal atrial fibrillation: Secondary | ICD-10-CM

## 2019-12-24 DIAGNOSIS — I255 Ischemic cardiomyopathy: Secondary | ICD-10-CM

## 2019-12-24 DIAGNOSIS — Z79899 Other long term (current) drug therapy: Secondary | ICD-10-CM

## 2019-12-24 DIAGNOSIS — Z95810 Presence of automatic (implantable) cardiac defibrillator: Secondary | ICD-10-CM | POA: Diagnosis not present

## 2019-12-24 NOTE — Progress Notes (Signed)
Patient Care Team: Lavone Orn, MD as PCP - General (Internal Medicine) Fay Records, MD as PCP - Cardiology (Cardiology) Elmarie Shiley, MD (Nephrology) Deboraha Sprang, MD (Cardiology) Renato Shin, MD as Consulting Physician (Endocrinology)   HPI  Caleb Bell is a 64 y.o. male Seen in follow-up for ischemic cardiomyopathy with prior bypass surgery and an IVCD for which he underwent CRT-D 3/13 with generator change 2/19.    There have been change in RV pacing threshold which would raise the issue as to whether an RV lead revision will be necessary; at time of revision it was elected not to   Device interrogation demonstrates an abrupt change in auto capture threshold in the last month  Date RV threshold  2016 1.25/0.8  2/19 2.0/0.8  5/19 1.875/0.8  11/20 3V/0.8   The patient denies chest pain, shortness of breath, nocturnal dyspnea, orthopnea or peripheral edema.  There have been no palpitations, lightheadedness or syncope.    Remote history also notable for Hodgkin's disease and PAF.   DATE TEST EF   10/13 Echo  20-25 %   12/15 Echo   20.25 %   2/19 Echo  30-35%         Date Cr K Dig Hgb  2/19 1.39 4.7  12.9   9/20 1.53 4.7 0.8 12.7  6/21 1.7          Past Medical History:  Diagnosis Date  . Atrial fibrillation (HCC)    Paroxysmal, limited  . CAD (coronary artery disease)    CABG 2003  . Chronic systolic heart failure (HCC)    EF 20-25% echo 5- 2011  . CRI (chronic renal insufficiency)     multifactorial... Dr. Posey Pronto.. consult September 18, 2009  . DM type 2 (diabetes mellitus, type 2) (HCC)    Dr Loanne Drilling  . Ejection fraction < 50%    EF 25%, echo, November, 2012  //   planning followup 2-D echo to assess LV function with CRT D.  device in place  . Elevated serum creatinine   . Epididymal cyst 2017   right, no testis masses  . Hodgkin's disease     Dx aprox 2004, s/p XRT-Chemo/had radiation too  . Hyperkalemia    August, 2011,  Aldactone, Aldactone stopped  . Hyperlipidemia    low HDL  . Hypothyroidism   . ICD (implantable cardiac defibrillator) in place    CRT-D placed March, 2013  . Ischemic cardiomyopathy    MRI12/12 no real viability in hypo/akinetic segment  CAth native and graft disease  . IVCD (intraventricular conduction defect)   . Myocardial infarction ([place])    mild MI/ several MI per pt in the past.  . Personal history of colonic adenomas 12/24/2011  . Polysubstance    Alcohol, cocaine- resoloved for many years     Past Surgical History:  Procedure Laterality Date  . BI-VENTRICULAR IMPLANTABLE CARDIOVERTER DEFIBRILLATOR N/A 04/01/2011   Procedure: BI-VENTRICULAR IMPLANTABLE CARDIOVERTER DEFIBRILLATOR  (CRT-D);  Surgeon: Deboraha Sprang, MD;  Location: [facility] CATH LAB;  Service: Cardiovascular;  Laterality: N/A;  . BIV ICD GENERATOR CHANGEOUT N/A 03/10/2017   Procedure: BIV ICD GENERATOR CHANGEOUT;  Surgeon: Deboraha Sprang, MD;  Location: [place] CV LAB;  Service: Cardiovascular;  Laterality: N/A;  . CARDIAC DEFIBRILLATOR PLACEMENT  03/2011  . CATARACT EXTRACTION    . COLONOSCOPY  12/24/2011   Procedure: COLONOSCOPY;  Surgeon: [name], MD;  Location: [facility] ENDOSCOPY;  Service: Endoscopy;  Laterality: N/A;  . COLONOSCOPY WITH PROPOFOL N/A 06/30/2017   Procedure: COLONOSCOPY WITH PROPOFOL;  Surgeon: [name], MD;  Location: [facility] ENDOSCOPY;  Service: Endoscopy;  Laterality: N/A;  . CORONARY ARTERY BYPASS GRAFT  2003  . CYSTECTOMY     Upper Back  . POLYPECTOMY  06/30/2017   Procedure: POLYPECTOMY;  Surgeon: [name], MD;  Location: [facility] ENDOSCOPY;  Service: Endoscopy;;  . TONSILLECTOMY      Current Meds  Medication Sig  . acetaminophen (TYLENOL) 500 MG tablet Take 1,000 mg by mouth every 8 (eight) hours as needed for mild pain or headache.  Marland Kitchen atorvastatin (LIPITOR) 80 MG tablet Take 80 mg by mouth at bedtime.   Marland Kitchen BAYER MICROLET LANCETS lancets USE TO CHECK BLOOD SUGAR 2 TIMES PER DAY.   . carvedilol (COREG) 12.5 MG tablet TAKE 1 TABLET BY MOUTH TWICE DAILY WITH A MEAL,  . digoxin (LANOXIN) 0.125 MG tablet TAKE ONE TABLET BY MOUTH ONE TIME DAILY.  Marland Kitchen ELIQUIS 5 MG TABS tablet TAKE 1 TABLET(5 MG) BY MOUTH TWICE DAILY  . furosemide (LASIX) 20 MG tablet Take 20 mg by mouth daily.  Marland Kitchen glucose blood (CONTOUR NEXT TEST) test strip 1 each by Other route in the morning and at bedtime. And lancets 2/day  . insulin aspart (NOVOLOG FLEXPEN) 100 UNIT/ML FlexPen THREE TIMES DAILY(JUST BEFORE EACH MEAL) 17-15-35 units  . insulin detemir (LEVEMIR FLEXTOUCH) 100 UNIT/ML FlexPen Inject 3 Units into the skin at bedtime.  . Insulin Pen Needle (NOVOFINE) 32G X 6 MM MISC 1 each by Other route 3 (three) times daily. E11.9  . levothyroxine (SYNTHROID, LEVOTHROID) 88 MCG tablet Take 1 tablet (88 mcg total) by mouth daily before breakfast.  . sacubitril-valsartan (ENTRESTO) 24-26 MG Take 1 tablet by mouth 2 (two) times daily.  Marland Kitchen spironolactone (ALDACTONE) 25 MG tablet TAKE 1/2 TABLET BY MOUTH DAILY    No Known Allergies    Review of Systems negative except from HPI and PMH  Physical Exam BP (!) 94/58   Pulse 80   Ht 6\' 1"  (1.854 m)   Wt 203 lb (92.1 kg)   SpO2 98%   BMI 26.78 kg/m  Well developed and well nourished in no acute distress HENT normal Neck supple with JVP-flat Clear Device pocket well healed; without hematoma or erythema.  There is no tethering  Regular rate and rhythm, no murmur Abd-soft with active BS No Clubbing cyanosis   edema Skin-warm and dry A & Oriented  Grossly normal sensory and motor function  ECG atrial flutter With some intrinsic conduction With pacing QRS upright lead V1 and negative lead I   assessment and  Plan  Atrial fibrillation paroxysmal 7.3 %   Ischemic cardiomyopathy  Congestive heart failure-chronic systolic  CRT-D.-[employer]    VT/PVCs  Thromboembolic risk factors diabetes hypertension vascular idisease and CHF   CHADS-VASc  score of greater than or equal to 4  RV lead--elevated threshold now programmed subthreshold   Increasing burden of atrial fibrillation.  Seems to be responsible for suboptimal BiV pacing  Asymptomatic.  We will follow for now.  Without symptoms of ischemia  Check dig level  Euvolemic continue current meds  Event recorder was reviewed with the patient.  Episodes of nonsustained ventricular tachycardia with a suggestion of an RVOT origin as well as episodes of bigeminy, both contributing to the suboptimal BiV pacing percentage having said this reviewed the tracing again I will need to look at it further with colleagues.  Current medicines are reviewed at length with the patient today .  The patient does not  have concerns regarding medicines.

## 2019-12-24 NOTE — Patient Instructions (Signed)
Medication Instructions: [' Your physician recommends that you continue on your current medications as directed. Please refer to the Current Medication list given to you today. *If you need a refill on your cardiac medications before your next appointment, please call your pharmacy*   Lab Work: CBC, BMET, DIG and LIPIDS  If you have labs (blood work) drawn today and your tests are completely normal, you will receive your results only by: Marland Kitchen MyChart Message (if you have MyChart) OR . A paper copy in the mail If you have any lab test that is abnormal or we need to change your treatment, we will call you to review the results.   Testing/Procedures: None ordered.    Follow-Up: At [facility], you and your health needs are our priority.  As part of our continuing mission to provide you with exceptional heart care, we have created designated Provider Care Teams.  These Care Teams include your primary Cardiologist (physician) and Advanced Practice Providers (APPs -  Physician Assistants and Nurse Practitioners) who all work together to provide you with the care you need, when you need it.  We recommend signing up for the patient portal called "MyChart".  Sign up information is provided on this After Visit Summary.  MyChart is used to connect with patients for Virtual Visits (Telemedicine).  Patients are able to view lab/test results, encounter notes, upcoming appointments, etc.  Non-urgent messages can be sent to your provider as well.   To learn more about what you can do with MyChart, go to [email or web address].    Your next appointment:  Please schedule appointment with APP for 04/22.

## 2019-12-25 LAB — CBC
Hematocrit: 39.4 % (ref 37.5–51.0)
Hemoglobin: 13.2 g/dL (ref 13.0–17.7)
MCH: 28 pg (ref 26.6–33.0)
MCHC: 33.5 g/dL (ref 31.5–35.7)
MCV: 84 fL (ref 79–97)
Platelets: 216 10*3/uL (ref 150–450)
RBC: 4.72 x10E6/uL (ref 4.14–5.80)
RDW: 13.2 % (ref 11.6–15.4)
WBC: 6.9 10*3/uL (ref 3.4–10.8)

## 2019-12-25 LAB — LIPID PANEL
Chol/HDL Ratio: 6.1 ratio — ABNORMAL HIGH (ref 0.0–5.0)
Cholesterol, Total: 206 mg/dL — ABNORMAL HIGH (ref 100–199)
HDL: 34 mg/dL — ABNORMAL LOW (ref >39)
LDL Chol Calc (NIH): 102 mg/dL — ABNORMAL HIGH (ref 0–99)
Triglycerides: 411 mg/dL — ABNORMAL HIGH (ref 0–149)
VLDL Cholesterol Cal: 70 mg/dL — ABNORMAL HIGH (ref 5–40)

## 2019-12-25 LAB — BASIC METABOLIC PANEL
BUN/Creatinine Ratio: 26 — ABNORMAL HIGH (ref 10–24)
BUN: 37 mg/dL — ABNORMAL HIGH (ref 8–27)
CO2: 22 mmol/L (ref 20–29)
Calcium: 9.5 mg/dL (ref 8.6–10.2)
Chloride: 98 mmol/L (ref 96–106)
Creatinine, Ser: 1.44 mg/dL — ABNORMAL HIGH (ref 0.76–1.27)
GFR calc Af Amer: 59 mL/min/{1.73_m2} — ABNORMAL LOW (ref >59)
GFR calc non Af Amer: 51 mL/min/{1.73_m2} — ABNORMAL LOW (ref >59)
Glucose: 209 mg/dL — ABNORMAL HIGH (ref 65–99)
Potassium: 4.6 mmol/L (ref 3.5–5.2)
Sodium: 138 mmol/L (ref 134–144)

## 2019-12-25 LAB — DIGOXIN LEVEL: Digoxin, Serum: 1 ng/mL — ABNORMAL HIGH (ref 0.5–0.9)

## 2020-01-03 ENCOUNTER — Telehealth: Payer: Self-pay

## 2020-01-03 DIAGNOSIS — Z79899 Other long term (current) drug therapy: Secondary | ICD-10-CM

## 2020-01-03 DIAGNOSIS — I2510 Atherosclerotic heart disease of native coronary artery without angina pectoris: Secondary | ICD-10-CM

## 2020-01-03 DIAGNOSIS — I480 Paroxysmal atrial fibrillation: Secondary | ICD-10-CM

## 2020-01-03 NOTE — Telephone Encounter (Signed)
Spoke with pt and advised of lab results per Dr Caryl Comes.  Pt states he continues to take Atorvastatin 80mg  qhs.  Appointment scheduled for repeat Dig level on 03/18/2019.  Pt verbalizes understanding and agrees with current plan.

## 2020-01-03 NOTE — Telephone Encounter (Signed)
-----   Message from Deboraha Sprang, MD sent at 01/01/2020 11:39 AM EST ----- Please Inform Patient  Labs are normal x  1) dig level upper limit of normal,  lets recheck in 8 weeks 2) lipids are unusual   is he still taking his atorvastatin  Thanks

## 2020-01-04 NOTE — Telephone Encounter (Signed)
Lets recheck the labs in January instead Thanks SK

## 2020-01-05 NOTE — Telephone Encounter (Signed)
Appointment scheduled for 02/18/2020 at 830am.  MyChart message sent to pt regarding appointment date and time change.

## 2020-01-07 ENCOUNTER — Ambulatory Visit (INDEPENDENT_AMBULATORY_CARE_PROVIDER_SITE_OTHER): Payer: Managed Care, Other (non HMO) | Admitting: Endocrinology

## 2020-01-07 ENCOUNTER — Other Ambulatory Visit: Payer: Self-pay

## 2020-01-07 ENCOUNTER — Encounter: Payer: Self-pay | Admitting: Endocrinology

## 2020-01-07 VITALS — BP 110/88 | HR 64 | Ht 73.0 in | Wt 202.0 lb

## 2020-01-07 DIAGNOSIS — E1122 Type 2 diabetes mellitus with diabetic chronic kidney disease: Secondary | ICD-10-CM

## 2020-01-07 DIAGNOSIS — Z794 Long term (current) use of insulin: Secondary | ICD-10-CM

## 2020-01-07 DIAGNOSIS — N1831 Chronic kidney disease, stage 3a: Secondary | ICD-10-CM | POA: Diagnosis not present

## 2020-01-07 LAB — POCT GLYCOSYLATED HEMOGLOBIN (HGB A1C): Hemoglobin A1C: 7.9 % — AB (ref 4.0–5.6)

## 2020-01-07 MED ORDER — LEVEMIR FLEXTOUCH 100 UNIT/ML ~~LOC~~ SOPN
5.0000 [IU] | PEN_INJECTOR | Freq: Every day | SUBCUTANEOUS | 11 refills | Status: DC
Start: 2020-01-07 — End: 2020-04-07

## 2020-01-07 NOTE — Patient Instructions (Addendum)
I have sent a prescription to your pharmacy, to increase the Levemir,  and: Please continue the same Novolog. check your blood sugar twice a day.  vary the time of day when you check, between before the 3 meals, and at bedtime.  also check if you have symptoms of your blood sugar being too high or too low.  please keep a record of the readings and bring it to your next appointment here (or you can bring the meter itself).  You can write it on any piece of paper.  please call us sooner if your blood sugar goes below 70, or if you have a lot of readings over 200. Please come back for a follow-up appointment in 3 months.

## 2020-01-07 NOTE — Progress Notes (Signed)
Subjective:    Patient ID: Caleb Bell, male    DOB: 06-15-1955, 64 y.o.   MRN: [id number]  HPI Pt returns for f/u of diabetes mellitus:  DM type: 1 Dx'ed: 1749 Complications: PN, stage 3 CRI, foot ulcer, DR, PAD, and CAD.   Therapy: insulin since 2010.   DKA: never.   Severe hypoglycemia: never.    Pancreatitis: never.   Other: he takes multiple daily injections; he declines pump therapy; the pattern of his cbg's indicates he does not need basal insulin (prob due to renal insufficiency); he declines to add oral rx  Interval history: pt states he feels well in general.  cbg varies from 180-200.  It is in general highest fasting.  He seldom has hypoglycemia, and these episodes are mild.   Past Medical History:  Diagnosis Date  . Atrial fibrillation (HCC)    Paroxysmal, limited  . CAD (coronary artery disease)    CABG 2003  . Chronic systolic heart failure (HCC)    EF 20-25% echo 5- 2011  . CRI (chronic renal insufficiency)     multifactorial... Dr. Posey Pronto.. consult September 18, 2009  . DM type 2 (diabetes mellitus, type 2) (HCC)    Dr Loanne Drilling  . Ejection fraction < 50%    EF 25%, echo, November, 2012  //   planning followup 2-D echo to assess LV function with CRT D.  device in place  . Elevated serum creatinine   . Epididymal cyst 2017   right, no testis masses  . Hodgkin's disease     Dx aprox 2004, s/p XRT-Chemo/had radiation too  . Hyperkalemia    August, 2011, Aldactone, Aldactone stopped  . Hyperlipidemia    low HDL  . Hypothyroidism   . ICD (implantable cardiac defibrillator) in place    CRT-D placed March, 2013  . Ischemic cardiomyopathy    MRI12/12 no real viability in hypo/akinetic segment  CAth native and graft disease  . IVCD (intraventricular conduction defect)   . Myocardial infarction ([place])    mild MI/ several MI per pt in the past.  . Personal history of colonic adenomas 12/24/2011  . Polysubstance    Alcohol, cocaine- resoloved for many years      Past Surgical History:  Procedure Laterality Date  . BI-VENTRICULAR IMPLANTABLE CARDIOVERTER DEFIBRILLATOR N/A 04/01/2011   Procedure: BI-VENTRICULAR IMPLANTABLE CARDIOVERTER DEFIBRILLATOR  (CRT-D);  Surgeon: Deboraha Sprang, MD;  Location: [facility] CATH LAB;  Service: Cardiovascular;  Laterality: N/A;  . BIV ICD GENERATOR CHANGEOUT N/A 03/10/2017   Procedure: BIV ICD GENERATOR CHANGEOUT;  Surgeon: Deboraha Sprang, MD;  Location: [place] CV LAB;  Service: Cardiovascular;  Laterality: N/A;  . CARDIAC DEFIBRILLATOR PLACEMENT  03/2011  . CATARACT EXTRACTION    . COLONOSCOPY  12/24/2011   Procedure: COLONOSCOPY;  Surgeon: [name], MD;  Location: [facility] ENDOSCOPY;  Service: Endoscopy;  Laterality: N/A;  . COLONOSCOPY WITH PROPOFOL N/A 06/30/2017   Procedure: COLONOSCOPY WITH PROPOFOL;  Surgeon: [name], MD;  Location: [facility] ENDOSCOPY;  Service: Endoscopy;  Laterality: N/A;  . CORONARY ARTERY BYPASS GRAFT  2003  . CYSTECTOMY     Upper Back  . POLYPECTOMY  06/30/2017   Procedure: POLYPECTOMY;  Surgeon: [name], MD;  Location: [facility] ENDOSCOPY;  Service: Endoscopy;;  . TONSILLECTOMY      Social History   Socioeconomic History  . Marital status: Married    Spouse name: Not on file  . Number of children: 0  . Years of  education: Not on file  . Highest education level: Not on file  Occupational History  . Occupation: musician  Tobacco Use  . Smoking status: Never Smoker  . Smokeless tobacco: Never Used  Vaping Use  . Vaping Use: Never used  Substance and Sexual Activity  . Alcohol use: No    Alcohol/week: 0.0 standard drinks    Comment: Hx of abuse/clean since 2003   . Drug use: No    Comment: Hx of abuse/clean for many years  . Sexual activity: Not on file  Other Topics Concern  . Not on file  Social History Narrative   Musician   Single, no children, lives by himself   Alcohol use-no (hx of abuse clean since 2003)      Drug use-no (hx of abuse clean x years)      Social Determinants of Health   Financial Resource Strain: Not on file  Food Insecurity: Not on file  Transportation Needs: Not on file  Physical Activity: Not on file  Stress: Not on file  Social Connections: Not on file  Intimate Partner Violence: Not on file    Current Outpatient Medications on File Prior to Visit  Medication Sig Dispense Refill  . acetaminophen (TYLENOL) 500 MG tablet Take 1,000 mg by mouth every 8 (eight) hours as needed for mild pain or headache.    Marland Kitchen atorvastatin (LIPITOR) 80 MG tablet Take 80 mg by mouth at bedtime.   6  . BAYER MICROLET LANCETS lancets USE TO CHECK BLOOD SUGAR 2 TIMES PER DAY. 100 each 12  . carvedilol (COREG) 12.5 MG tablet TAKE 1 TABLET BY MOUTH TWICE DAILY WITH A MEAL, 180 tablet 3  . digoxin (LANOXIN) 0.125 MG tablet TAKE ONE TABLET BY MOUTH ONE TIME DAILY. 90 tablet 1  . ELIQUIS 5 MG TABS tablet TAKE 1 TABLET(5 MG) BY MOUTH TWICE DAILY 180 tablet 2  . furosemide (LASIX) 20 MG tablet Take 20 mg by mouth daily.    Marland Kitchen glucose blood (CONTOUR NEXT TEST) test strip 1 each by Other route in the morning and at bedtime. And lancets 2/day 200 each 3  . insulin aspart (NOVOLOG FLEXPEN) 100 UNIT/ML FlexPen THREE TIMES DAILY(JUST BEFORE EACH MEAL) 17-15-35 units 60 mL 4  . Insulin Pen Needle (NOVOFINE) 32G X 6 MM MISC 1 each by Other route 3 (three) times daily. E11.9 100 each 2  . levothyroxine (SYNTHROID, LEVOTHROID) 88 MCG tablet Take 1 tablet (88 mcg total) by mouth daily before breakfast. 30 tablet 0  . sacubitril-valsartan (ENTRESTO) 24-26 MG Take 1 tablet by mouth 2 (two) times daily. 60 tablet 11  . spironolactone (ALDACTONE) 25 MG tablet TAKE 1/2 TABLET BY MOUTH DAILY 45 tablet 0   No current facility-administered medications on file prior to visit.    No Known Allergies  Family History  Adopted: Yes  Family history unknown: Yes    BP 110/88   Pulse 64   Ht 6\' 1"  (1.854 m)   Wt 202 lb (91.6 kg)   BMI 26.65 kg/m    Review of  Systems     Objective:   Physical Exam VITAL SIGNS:  See vs page GENERAL: no distress Pulses: dorsalis pedis intact bilat.   MSK: no deformity of the feet CV: no leg edema Skin:  no ulcer on the feet.  normal color and temp on the feet. Neuro: sensation is intact to touch on the feet.   Lab Results  Component Value Date   TSH 3.93 12/11/2018  Lab Results  Component Value Date   CREATININE 1.44 (H) 12/24/2019   BUN 37 (H) 12/24/2019   NA 138 12/24/2019   K 4.6 12/24/2019   CL 98 12/24/2019   CO2 22 12/24/2019    Lab Results  Component Value Date   HGBA1C 7.9 (A) 01/07/2020       Assessment & Plan:  Type 1 DM, with PAD: uncontrolled Hypoglycemia, due to insulin: we'll have to increase insulin just slightly.  Patient Instructions  I have sent a prescription to your pharmacy, to increase the Levemir,  and: Please continue the same Novolog. check your blood sugar twice a day.  vary the time of day when you check, between before the 3 meals, and at bedtime.  also check if you have symptoms of your blood sugar being too high or too low.  please keep a record of the readings and bring it to your next appointment here (or you can bring the meter itself).  You can write it on any piece of paper.  please call us sooner if your blood sugar goes below 70, or if you have a lot of readings over 200. Please come back for a follow-up appointment in 3 months.

## 2020-01-12 ENCOUNTER — Telehealth: Payer: Self-pay

## 2020-01-12 NOTE — Telephone Encounter (Signed)
Called and spoke with patient, he is aware of appt 01/17/20 at 11 am with Roderic Palau, NP.

## 2020-01-12 NOTE — Telephone Encounter (Signed)
Merlin alert received for increased AT/AF burden 29%, ongoing since 01/07/20. V rates are controlled. BiV pacing 84%. Seen by Dr. Caryl Comes 12/24/19, was noted increasing burden of atrial fibrillation. Seems responsible for suboptimal BiV pacing. Reports compliance with all medications including coreg 12.5 mg BID, Eliquis 5 mg BID, Lasix 20 mg daily, Entresto 26-24 mg BID. Patient reports of increased shortness of breath with exertion occassionally. Denies any palpitations, dizziness, fatigue fluid retention issues. States otherwise he has felt fine. Requested manual transmission to see presenting.  Presenting: AF/ VS 100-155 bpm. Advised patient I will send a message to the AF clinic and they will call him in regards to following up regarding AF. Patient advised if anything changes to please call us back. Verbalized understanding.

## 2020-01-17 ENCOUNTER — Other Ambulatory Visit: Payer: Self-pay

## 2020-01-17 ENCOUNTER — Ambulatory Visit (HOSPITAL_COMMUNITY)
Admission: RE | Admit: 2020-01-17 | Discharge: 2020-01-17 | Disposition: A | Discharge status: Home / Self Care | Payer: Managed Care, Other (non HMO) | Source: Ambulatory Visit | Attending: Nurse Practitioner | Admitting: Nurse Practitioner

## 2020-01-17 VITALS — BP 116/64 | HR 80 | Ht 73.0 in | Wt 206.4 lb

## 2020-01-17 DIAGNOSIS — I4891 Unspecified atrial fibrillation: Secondary | ICD-10-CM | POA: Diagnosis not present

## 2020-01-17 DIAGNOSIS — I454 Nonspecific intraventricular block: Secondary | ICD-10-CM | POA: Insufficient documentation

## 2020-01-17 DIAGNOSIS — I4819 Other persistent atrial fibrillation: Secondary | ICD-10-CM | POA: Diagnosis not present

## 2020-01-17 DIAGNOSIS — I5022 Chronic systolic (congestive) heart failure: Secondary | ICD-10-CM | POA: Diagnosis not present

## 2020-01-17 DIAGNOSIS — Z79899 Other long term (current) drug therapy: Secondary | ICD-10-CM | POA: Diagnosis not present

## 2020-01-17 DIAGNOSIS — D6869 Other thrombophilia: Secondary | ICD-10-CM

## 2020-01-17 DIAGNOSIS — I255 Ischemic cardiomyopathy: Secondary | ICD-10-CM | POA: Insufficient documentation

## 2020-01-17 DIAGNOSIS — Z95810 Presence of automatic (implantable) cardiac defibrillator: Secondary | ICD-10-CM | POA: Insufficient documentation

## 2020-01-17 DIAGNOSIS — Z7901 Long term (current) use of anticoagulants: Secondary | ICD-10-CM | POA: Insufficient documentation

## 2020-01-17 DIAGNOSIS — I252 Old myocardial infarction: Secondary | ICD-10-CM | POA: Insufficient documentation

## 2020-01-17 DIAGNOSIS — I2510 Atherosclerotic heart disease of native coronary artery without angina pectoris: Secondary | ICD-10-CM | POA: Diagnosis not present

## 2020-01-17 DIAGNOSIS — Z951 Presence of aortocoronary bypass graft: Secondary | ICD-10-CM | POA: Diagnosis not present

## 2020-01-17 DIAGNOSIS — I4892 Unspecified atrial flutter: Secondary | ICD-10-CM | POA: Diagnosis not present

## 2020-01-17 DIAGNOSIS — E785 Hyperlipidemia, unspecified: Secondary | ICD-10-CM | POA: Diagnosis not present

## 2020-01-17 LAB — CBC
HCT: 36.8 % — ABNORMAL LOW (ref 39.0–52.0)
Hemoglobin: 12.4 g/dL — ABNORMAL LOW (ref 13.0–17.0)
MCH: 28.1 pg (ref 26.0–34.0)
MCHC: 33.7 g/dL (ref 30.0–36.0)
MCV: 83.3 fL (ref 80.0–100.0)
Platelets: 187 10*3/uL (ref 150–400)
RBC: 4.42 MIL/uL (ref 4.22–5.81)
RDW: 13.8 % (ref 11.5–15.5)
WBC: 6.5 10*3/uL (ref 4.0–10.5)
nRBC: 0 % (ref 0.0–0.2)

## 2020-01-17 LAB — BASIC METABOLIC PANEL
Anion gap: 7 (ref 5–15)
BUN: 32 mg/dL — ABNORMAL HIGH (ref 8–23)
CO2: 24 mmol/L (ref 22–32)
Calcium: 8.9 mg/dL (ref 8.9–10.3)
Chloride: 102 mmol/L (ref 98–111)
Creatinine, Ser: 1.57 mg/dL — ABNORMAL HIGH (ref 0.61–1.24)
GFR, Estimated: 49 mL/min — ABNORMAL LOW (ref >60)
Glucose, Bld: 257 mg/dL — ABNORMAL HIGH (ref 70–99)
Potassium: 4.5 mmol/L (ref 3.5–5.1)
Sodium: 133 mmol/L — ABNORMAL LOW (ref 135–145)

## 2020-01-17 LAB — TSH: TSH: 3.056 u[IU]/mL (ref 0.350–4.500)

## 2020-01-17 NOTE — Progress Notes (Signed)
Primary Care Physician: Kirby Funk, MD Referring Physician: Dr. Eli Phillips is a 64 y.o. male with a h/o DM,  ischemic cardiomyopathy with prior bypass surgery and an IVCD for which he underwent CRT-D 3/13 with generator change 2/19, paroxysmal afib.    He was seen by Dr. Graciela Husbands  12/3 and afib was noted at 7.3% burden by device report. . At that visit, it  was decided since pt was asymptomatic to follow afib burden for then. He was then contacted by the device clinic,after Merlin alert, and was found to be in persistent  afib since 01/07/20.   He was referred to the afib clinic, to consider CV vrs Tikosyn vrs ablation, per Dr. Graciela Husbands. Today, EKG shows atrial flutter with variable block, controlled at 80 bpm. . He is fairly asymptomatic. He noted some mild exertional shortness of breath over the weekend. No fatigue. He has had all 3 covid vaccines. No missed anticoagulation for at least 3 weeks.. CHA2DS2VASc score is of least 4. Denies  alcohol use, mod caffeine use, no snoring/apnea per pt. Discussed options with pt and he would like to proceed to CV. No previous cardioversion's.   Today, he denies symptoms of palpitations, chest pain,  orthopnea, PND, lower extremity edema, dizziness, presyncope, syncope, or neurologic sequela. The patient is tolerating medications without difficulties and is otherwise without complaint today.   Past Medical History:  Diagnosis Date  . Atrial fibrillation (HCC)    Paroxysmal, limited  . CAD (coronary artery disease)    CABG 2003  . Chronic systolic heart failure (HCC)    EF 20-25% echo 5- 2011  . CRI (chronic renal insufficiency)     multifactorial... Dr. Allena Katz.. consult September 18, 2009  . DM type 2 (diabetes mellitus, type 2) (HCC)    Dr Everardo All  . Ejection fraction < 50%    EF 25%, echo, November, 2012  //   planning followup 2-D echo to assess LV function with CRT D.  device in place  . Elevated serum creatinine   . Epididymal cyst  2017   right, no testis masses  . Hodgkin's disease     Dx aprox 2004, s/p XRT-Chemo/had radiation too  . Hyperkalemia    August, 2011, Aldactone, Aldactone stopped  . Hyperlipidemia    low HDL  . Hypothyroidism   . ICD (implantable cardiac defibrillator) in place    CRT-D placed March, 2013  . Ischemic cardiomyopathy    MRI12/12 no real viability in hypo/akinetic segment  CAth native and graft disease  . IVCD (intraventricular conduction defect)   . Myocardial infarction (HCC)    mild MI/ several MI per pt in the past.  . Personal history of colonic adenomas 12/24/2011  . Polysubstance    Alcohol, cocaine- resoloved for many years    Past Surgical History:  Procedure Laterality Date  . BI-VENTRICULAR IMPLANTABLE CARDIOVERTER DEFIBRILLATOR N/A 04/01/2011   Procedure: BI-VENTRICULAR IMPLANTABLE CARDIOVERTER DEFIBRILLATOR  (CRT-D);  Surgeon: Duke Salvia, MD;  Location: [facility] CATH LAB;  Service: Cardiovascular;  Laterality: N/A;  . BIV ICD GENERATOR CHANGEOUT N/A 03/10/2017   Procedure: BIV ICD GENERATOR CHANGEOUT;  Surgeon: Duke Salvia, MD;  Location: [facility] INVASIVE CV LAB;  Service: Cardiovascular;  Laterality: N/A;  . CARDIAC DEFIBRILLATOR PLACEMENT  03/2011  . CATARACT EXTRACTION    . COLONOSCOPY  12/24/2011   Procedure: COLONOSCOPY;  Surgeon: Iva Boop, MD;  Location: [facility] ENDOSCOPY;  Service: Endoscopy;  Laterality: N/A;  .  COLONOSCOPY WITH PROPOFOL N/A 06/30/2017   Procedure: COLONOSCOPY WITH PROPOFOL;  Surgeon: [name], MD;  Location: [facility] ENDOSCOPY;  Service: Endoscopy;  Laterality: N/A;  . CORONARY ARTERY BYPASS GRAFT  2003  . CYSTECTOMY     Upper Back  . POLYPECTOMY  06/30/2017   Procedure: POLYPECTOMY;  Surgeon: [name], MD;  Location: [facility];  Service: Endoscopy;;  . TONSILLECTOMY      Current Outpatient Medications  Medication Sig Dispense Refill  . acetaminophen (TYLENOL) 500 MG tablet Take 1,000 mg by mouth every 8 (eight) hours as needed for  mild pain or headache.    Marland Kitchen atorvastatin (LIPITOR) 80 MG tablet Take 80 mg by mouth at bedtime.   6  . BAYER MICROLET LANCETS lancets USE TO CHECK BLOOD SUGAR 2 TIMES PER DAY. 100 each 12  . carvedilol (COREG) 12.5 MG tablet TAKE 1 TABLET BY MOUTH TWICE DAILY WITH A MEAL, 180 tablet 3  . digoxin (LANOXIN) 0.125 MG tablet TAKE ONE TABLET BY MOUTH ONE TIME DAILY. 90 tablet 1  . ELIQUIS 5 MG TABS tablet TAKE 1 TABLET(5 MG) BY MOUTH TWICE DAILY 180 tablet 2  . furosemide (LASIX) 20 MG tablet Take 20 mg by mouth daily.    Marland Kitchen glucose blood (CONTOUR NEXT TEST) test strip 1 each by Other route in the morning and at bedtime. And lancets 2/day 200 each 3  . insulin aspart (NOVOLOG FLEXPEN) 100 UNIT/ML FlexPen THREE TIMES DAILY(JUST BEFORE EACH MEAL) 17-15-35 units 60 mL 4  . insulin detemir (LEVEMIR FLEXTOUCH) 100 UNIT/ML FlexPen Inject 5 Units into the skin at bedtime. 15 mL 11  . Insulin Pen Needle (NOVOFINE) 32G X 6 MM MISC 1 each by Other route 3 (three) times daily. E11.9 100 each 2  . levothyroxine (SYNTHROID, LEVOTHROID) 88 MCG tablet Take 1 tablet (88 mcg total) by mouth daily before breakfast. 30 tablet 0  . sacubitril-valsartan (ENTRESTO) 24-26 MG Take 1 tablet by mouth 2 (two) times daily. 60 tablet 11  . spironolactone (ALDACTONE) 25 MG tablet TAKE 1/2 TABLET BY MOUTH DAILY 45 tablet 0   No current facility-administered medications for this encounter.    No Known Allergies  Social History   Socioeconomic History  . Marital status: Married    Spouse name: Not on file  . Number of children: 0  . Years of education: Not on file  . Highest education level: Not on file  Occupational History  . Occupation: musician  Tobacco Use  . Smoking status: Never Smoker  . Smokeless tobacco: Never Used  Vaping Use  . Vaping Use: Never used  Substance and Sexual Activity  . Alcohol use: No    Alcohol/week: 0.0 standard drinks    Comment: Hx of abuse/clean since 2003   . Drug use: No     Comment: Hx of abuse/clean for many years  . Sexual activity: Not on file  Other Topics Concern  . Not on file  Social History Narrative   Musician   Single, no children, lives by himself   Alcohol use-no (hx of abuse clean since 2003)      Drug use-no (hx of abuse clean x years)     Social Determinants of Health   Financial Resource Strain: Not on file  Food Insecurity: Not on file  Transportation Needs: Not on file  Physical Activity: Not on file  Stress: Not on file  Social Connections: Not on file  Intimate Partner Violence: Not on file  Family History  Adopted: Yes  Family history unknown: Yes    ROS- All systems are reviewed and negative except as per the HPI above  Physical Exam: There were no vitals filed for this visit. Wt Readings from Last 3 Encounters:  01/07/20 91.6 kg  12/24/19 92.1 kg  10/11/19 90.5 kg    Labs: Lab Results  Component Value Date   NA 138 12/24/2019   K 4.6 12/24/2019   CL 98 12/24/2019   CO2 22 12/24/2019   GLUCOSE 209 (H) 12/24/2019   BUN 37 (H) 12/24/2019   CREATININE 1.44 (H) 12/24/2019   CALCIUM 9.5 12/24/2019   MG 1.9 09/22/2014   Lab Results  Component Value Date   INR 1.0 03/25/2011   Lab Results  Component Value Date   CHOL 206 (H) 12/24/2019   HDL 34 (L) 12/24/2019   LDLCALC 102 (H) 12/24/2019   TRIG 411 (H) 12/24/2019     GEN- The patient is well appearing, alert and oriented x 3 today.   Head- normocephalic, atraumatic Eyes-  Sclera clear, conjunctiva pink Ears- hearing intact Oropharynx- clear Neck- supple, no JVP Lymph- no cervical lymphadenopathy Lungs- Clear to ausculation bilaterally, normal work of breathing Heart- irregular rate and rhythm, no murmurs, rubs or gallops, PMI not laterally displaced GI- soft, NT, ND, + BS Extremities- no clubbing, cyanosis, or edema MS- no significant deformity or atrophy Skin- no rash or lesion Psych- euthymic mood, full affect Neuro- strength and sensation  are intact  EKG-Atrial flutter with variable A-V block with frequent ventricular-paced complexes and with premature ventricular or aberrantly conducted complexes QRS duration 128 ms QT/QTc 398/459 Non-specific intra-ventricular conduction block Inferior infarct , age undetermined T wave abnormality, consider lateral ischemia       Assessment and Plan: 1. Persistent afib/flutter  Discussed options with pt  He would like to proceed with cardioversion Risk vrs benefit of the procedure discussed  He is rate controlled  Continue digoxin and carvedilol at current doses  Has had the  3 covid vaccines  Bmet/cbc/tsh   2. CHA2DS2VASc score of at least 4 States no missed doses of eliquis Continue  eliquis 5 mg bid   F/u here one week  after cardioversion   Butch Penny C. Santino Kinsella, [facility] [address] [place], [place] [address] [phone]

## 2020-01-17 NOTE — Patient Instructions (Signed)
Cardioversion scheduled for Friday, January 7th  - Arrive at the [facility] and go to admitting at [facility] not eat or drink anything after midnight the night prior to your procedure.  - Take all your morning medication (except diabetic medications) with a sip of water prior to arrival.  - You will not be able to drive home after your procedure.  - Do NOT miss any doses of your blood thinner - if you should miss a dose please notify our office immediately.  - If you feel as if you go back into normal rhythm prior to scheduled cardioversion, please notify our office immediately. If your procedure is canceled in the cardioversion suite you will be charged a cancellation fee.

## 2020-01-17 NOTE — H&P (View-Only) (Signed)
Primary Care Physician: Caleb Funk, MD Referring Physician: Dr. [name] is a 64 y.o. male with a h/o DM,  ischemic cardiomyopathy with prior bypass surgery and an IVCD for which he underwent CRT-D 3/13 with generator change 2/19, paroxysmal afib.    He was seen by Dr. Graciela Bell  12/3 and afib was noted at 7.3% burden by device report. . At that visit, it  was decided since pt was asymptomatic to follow afib burden for then. He was then contacted by the device clinic,after Merlin alert, and was found to be in persistent  afib since 01/07/20.   He was referred to the afib clinic, to consider CV vrs Tikosyn vrs ablation, per Dr. Graciela Bell. Today, EKG shows atrial flutter with variable block, controlled at 80 bpm. . He is fairly asymptomatic. He noted some mild exertional shortness of breath over the weekend. No fatigue. He has had Bell 3 covid vaccines. No missed anticoagulation for at least 3 weeks.. CHA2DS2VASc score is of least 4. Denies  alcohol use, mod caffeine use, no snoring/apnea per pt. Discussed options with pt and he would like to proceed to CV. No previous cardioversion's.   Today, he denies symptoms of palpitations, chest pain,  orthopnea, PND, lower extremity edema, dizziness, presyncope, syncope, or neurologic sequela. The patient is tolerating medications without difficulties and is otherwise without complaint today.   Past Medical History:  Diagnosis Date  . Atrial fibrillation (HCC)    Paroxysmal, limited  . CAD (coronary artery disease)    CABG 2003  . Chronic systolic heart failure (HCC)    EF 20-25% echo 5- 2011  . CRI (chronic renal insufficiency)     multifactorial... Dr. Allena Bell.. consult September 18, 2009  . DM type 2 (diabetes mellitus, type 2) (HCC)    Dr Caleb Bell  . Ejection fraction < 50%    EF 25%, echo, November, 2012  //   planning followup 2-D echo to assess LV function with CRT D.  device in place  . Elevated serum creatinine   . Epididymal cyst  2017   right, no testis masses  . Hodgkin's disease     Dx aprox 2004, s/p XRT-Chemo/had radiation too  . Hyperkalemia    August, 2011, Aldactone, Aldactone stopped  . Hyperlipidemia    low HDL  . Hypothyroidism   . ICD (implantable cardiac defibrillator) in place    CRT-D placed March, 2013  . Ischemic cardiomyopathy    MRI12/12 no real viability in hypo/akinetic segment  CAth native and graft disease  . IVCD (intraventricular conduction defect)   . Myocardial infarction (HCC)    mild MI/ several MI per pt in the past.  . Personal history of colonic adenomas 12/24/2011  . Polysubstance    Alcohol, cocaine- resoloved for many years    Past Surgical History:  Procedure Laterality Date  . BI-VENTRICULAR IMPLANTABLE CARDIOVERTER DEFIBRILLATOR N/A 04/01/2011   Procedure: BI-VENTRICULAR IMPLANTABLE CARDIOVERTER DEFIBRILLATOR  (CRT-D);  Surgeon: Caleb Salvia, MD;  Location: [facility] CATH LAB;  Service: Cardiovascular;  Laterality: N/A;  . BIV ICD GENERATOR CHANGEOUT N/A 03/10/2017   Procedure: BIV ICD GENERATOR CHANGEOUT;  Surgeon: Caleb Salvia, MD;  Location: [facility] INVASIVE CV LAB;  Service: Cardiovascular;  Laterality: N/A;  . CARDIAC DEFIBRILLATOR PLACEMENT  03/2011  . CATARACT EXTRACTION    . COLONOSCOPY  12/24/2011   Procedure: COLONOSCOPY;  Surgeon: Caleb Boop, MD;  Location: [facility] ENDOSCOPY;  Service: Endoscopy;  Laterality: N/A;  .  COLONOSCOPY WITH PROPOFOL N/A 06/30/2017   Procedure: COLONOSCOPY WITH PROPOFOL;  Surgeon: Caleb Mayer, MD;  Location: [facility] ENDOSCOPY;  Service: Endoscopy;  Laterality: N/A;  . CORONARY ARTERY BYPASS GRAFT  2003  . CYSTECTOMY     Upper Back  . POLYPECTOMY  06/30/2017   Procedure: POLYPECTOMY;  Surgeon: Caleb Mayer, MD;  Location: [facility];  Service: Endoscopy;;  . TONSILLECTOMY      Current Outpatient Medications  Medication Sig Dispense Refill  . acetaminophen (TYLENOL) 500 MG tablet Take 1,000 mg by mouth every 8 (eight) hours as needed for  mild pain or headache.    Marland Kitchen atorvastatin (LIPITOR) 80 MG tablet Take 80 mg by mouth at bedtime.   6  . BAYER MICROLET LANCETS lancets USE TO CHECK BLOOD SUGAR 2 TIMES PER DAY. 100 each 12  . carvedilol (COREG) 12.5 MG tablet TAKE 1 TABLET BY MOUTH TWICE DAILY WITH A MEAL, 180 tablet 3  . digoxin (LANOXIN) 0.125 MG tablet TAKE ONE TABLET BY MOUTH ONE TIME DAILY. 90 tablet 1  . ELIQUIS 5 MG TABS tablet TAKE 1 TABLET(5 MG) BY MOUTH TWICE DAILY 180 tablet 2  . furosemide (LASIX) 20 MG tablet Take 20 mg by mouth daily.    Marland Kitchen glucose blood (CONTOUR NEXT TEST) test strip 1 each by Other route in the morning and at bedtime. And lancets 2/day 200 each 3  . insulin aspart (NOVOLOG FLEXPEN) 100 UNIT/ML FlexPen THREE TIMES DAILY(JUST BEFORE EACH MEAL) 17-15-35 units 60 mL 4  . insulin detemir (LEVEMIR FLEXTOUCH) 100 UNIT/ML FlexPen Inject 5 Units into the skin at bedtime. 15 mL 11  . Insulin Pen Needle (NOVOFINE) 32G X 6 MM MISC 1 each by Other route 3 (three) times daily. E11.9 100 each 2  . levothyroxine (SYNTHROID, LEVOTHROID) 88 MCG tablet Take 1 tablet (88 mcg total) by mouth daily before breakfast. 30 tablet 0  . sacubitril-valsartan (ENTRESTO) 24-26 MG Take 1 tablet by mouth 2 (two) times daily. 60 tablet 11  . spironolactone (ALDACTONE) 25 MG tablet TAKE 1/2 TABLET BY MOUTH DAILY 45 tablet 0   No current facility-administered medications for this encounter.    No Known Allergies  Social History   Socioeconomic History  . Marital status: Married    Spouse name: Not on file  . Number of children: 0  . Years of education: Not on file  . Highest education level: Not on file  Occupational History  . Occupation: musician  Tobacco Use  . Smoking status: Never Smoker  . Smokeless tobacco: Never Used  Vaping Use  . Vaping Use: Never used  Substance and Sexual Activity  . Alcohol use: No    Alcohol/week: 0.0 standard drinks    Comment: Hx of abuse/clean since 2003   . Drug use: No     Comment: Hx of abuse/clean for many years  . Sexual activity: Not on file  Other Topics Concern  . 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 Wt Readings from Last 3 Encounters:  01/07/20 91.6 kg  12/24/19 92.1 kg  10/11/19 90.5 kg    Labs: Lab Results  Component Value Date   NA 138 12/24/2019   K 4.6 12/24/2019   CL 98 12/24/2019   CO2 22 12/24/2019   GLUCOSE 209 (H) 12/24/2019   BUN 37 (H) 12/24/2019   CREATININE 1.44 (H) 12/24/2019   CALCIUM 9.5 12/24/2019   MG 1.9 09/22/2014   Lab Results  Component Value Date   INR 1.0 03/25/2011   Lab Results  Component Value Date   CHOL 206 (H) 12/24/2019   HDL 34 (L) 12/24/2019   LDLCALC 102 (H) 12/24/2019   TRIG 411 (H) 12/24/2019     GEN- The patient is well appearing, alert and oriented x 3 today.   Head- normocephalic, atraumatic Eyes-  Sclera clear, conjunctiva pink Ears- hearing intact Oropharynx- clear Neck- supple, no JVP Lymph- no cervical lymphadenopathy Lungs- Clear to ausculation bilaterally, normal work of breathing Heart- irregular rate and rhythm, no murmurs, rubs or gallops, PMI not laterally displaced GI- soft, NT, ND, + BS Extremities- no clubbing, cyanosis, or edema MS- no significant deformity or atrophy Skin- no rash or lesion Psych- euthymic mood, full affect Neuro- strength and sensation  are intact  EKG-Atrial flutter with variable A-V block with frequent ventricular-paced complexes and with premature ventricular or aberrantly conducted complexes QRS duration 128 ms QT/QTc 398/459 Non-specific intra-ventricular conduction block Inferior infarct , age undetermined T wave abnormality, consider lateral ischemia       Assessment and Plan: 1. Persistent afib/flutter  Discussed options with pt  He would like to proceed with cardioversion Risk vrs benefit of the procedure discussed  He is rate controlled  Continue digoxin and carvedilol at current doses  Has had the  3 covid vaccines  Bmet/cbc/tsh   2. CHA2DS2VASc score of at least 4 States no missed doses of eliquis Continue  eliquis 5 mg bid   F/u here one week  after cardioversion   Caleb Bell, [facility] [address] [place], [place] [address] [phone]

## 2020-01-26 ENCOUNTER — Other Ambulatory Visit (HOSPITAL_COMMUNITY)
Admission: RE | Admit: 2020-01-26 | Discharge: 2020-01-26 | Disposition: A | Discharge status: Home / Self Care | Payer: Managed Care, Other (non HMO) | Source: Ambulatory Visit | Attending: Cardiovascular Disease | Admitting: Cardiovascular Disease

## 2020-01-26 DIAGNOSIS — Z20822 Contact with and (suspected) exposure to covid-19: Secondary | ICD-10-CM | POA: Insufficient documentation

## 2020-01-26 DIAGNOSIS — Z01812 Encounter for preprocedural laboratory examination: Secondary | ICD-10-CM | POA: Insufficient documentation

## 2020-01-26 LAB — SARS CORONAVIRUS 2 (TAT 6-24 HRS): SARS Coronavirus 2: NEGATIVE

## 2020-01-27 NOTE — Progress Notes (Signed)
Pre op call done for cardioversion tomorrow 01/28/20. Patient states he has been quarantined since covid test, has not missed any doses of eliquis and will take morning of procedure, will be NPO morning of, and confirmed he has a driver taking him home post procedure. All questions addressed.

## 2020-01-28 ENCOUNTER — Other Ambulatory Visit: Payer: Self-pay

## 2020-01-28 ENCOUNTER — Ambulatory Visit (HOSPITAL_COMMUNITY)
Admission: RE | Admit: 2020-01-28 | Discharge: 2020-01-28 | Disposition: A | Discharge status: Home / Self Care | Payer: Managed Care, Other (non HMO) | Attending: Cardiovascular Disease | Admitting: Cardiovascular Disease

## 2020-01-28 ENCOUNTER — Encounter (HOSPITAL_COMMUNITY)
Admission: RE | Disposition: A | Discharge status: Home / Self Care | Payer: Self-pay | Source: Home / Self Care | Attending: Cardiovascular Disease

## 2020-01-28 ENCOUNTER — Encounter (HOSPITAL_COMMUNITY): Payer: Self-pay | Admitting: Anesthesiology

## 2020-01-28 ENCOUNTER — Encounter (HOSPITAL_COMMUNITY): Payer: Self-pay | Admitting: Cardiovascular Disease

## 2020-01-28 DIAGNOSIS — Z7901 Long term (current) use of anticoagulants: Secondary | ICD-10-CM | POA: Insufficient documentation

## 2020-01-28 DIAGNOSIS — I4819 Other persistent atrial fibrillation: Secondary | ICD-10-CM | POA: Diagnosis present

## 2020-01-28 DIAGNOSIS — Z79899 Other long term (current) drug therapy: Secondary | ICD-10-CM | POA: Diagnosis not present

## 2020-01-28 DIAGNOSIS — I4892 Unspecified atrial flutter: Secondary | ICD-10-CM | POA: Insufficient documentation

## 2020-01-28 DIAGNOSIS — Z539 Procedure and treatment not carried out, unspecified reason: Secondary | ICD-10-CM | POA: Diagnosis not present

## 2020-01-28 SURGERY — CANCELLED PROCEDURE

## 2020-01-28 NOTE — Anesthesia Preprocedure Evaluation (Deleted)
Anesthesia Evaluation    Reviewed: Allergy & Precautions, Patient's Chart, lab work & pertinent test results, reviewed documented beta blocker date and time   Airway        Dental   Pulmonary neg pulmonary ROS,           Cardiovascular + CAD, + Past MI, + CABG (2003) and +CHF (LVEF 27-06%, grade 1 diastolic dysfunction)  + dysrhythmias (eliquis) Atrial Fibrillation + Cardiac Defibrillator + Valvular Problems/Murmurs (mild AS) AS   Echo 2019: - Left ventricle: The cavity size was mildly dilated. Wall  thickness was increased in a pattern of mild LVH. Inferior  hypokinesis, inferolateral akinesis, anterolateral hypokinesis,  anterior hypokinesis. Systolic function was moderately to  severely reduced. The estimated ejection fraction was in the  range of 30% to 35%. Doppler parameters are consistent with  abnormal left ventricular relaxation (grade 1 diastolic  dysfunction).  - Aortic valve: Trileaflet; moderately calcified leaflets. There  was mild stenosis. Mean gradient (S): 15 mm Hg. Valve area (VTI):  1.58 cm^2.  - Mitral valve: There was trivial regurgitation.  - Left atrium: The atrium was moderately dilated.  - Right ventricle: The cavity size was normal. Pacer wire or  catheter noted in right ventricle. Systolic function was normal.  - Right atrium: The atrium was mildly dilated.  - Tricuspid valve: Peak RV-RA gradient (S): 30 mm Hg.  - Pulmonary arteries: PA peak pressure: 33 mm Hg (S).  - Inferior vena cava: The vessel was normal in size. The  respirophasic diameter changes were in the normal range (>= 50%),  consistent with normal central venous pressure.    Neuro/Psych negative neurological ROS  negative psych ROS   GI/Hepatic negative GI ROS, (+)     substance abuse (hx polysubstance abuse- none in years)  alcohol use and cocaine use,   Endo/Other  diabetes, Well Controlled, Type 2,  Insulin DependentHypothyroidism Last a1c 7.9  Renal/GU Renal InsufficiencyRenal diseaseCr 1.57  negative genitourinary   Musculoskeletal negative musculoskeletal ROS (+)   Abdominal   Peds  Hematology Hx hodgkins disease 2004 s/p chemorad   Anesthesia Other Findings   Reproductive/Obstetrics negative OB ROS                             Anesthesia Physical Anesthesia Plan  ASA: IV  Anesthesia Plan: General   Post-op Pain Management:    Induction: Intravenous  PONV Risk Score and Plan: TIVA and Treatment may vary due to age or medical condition  Airway Management Planned: Natural Airway and Mask  Additional Equipment: None  Intra-op Plan:   Post-operative Plan:   Informed Consent:   Plan Discussed with:   Anesthesia Plan Comments:         Anesthesia Quick Evaluation

## 2020-01-28 NOTE — Progress Notes (Signed)
DCCV cancelled. In NSR.   [place] T. Audie Box, [place]  [address] [place], [place] [address] [phone]  8:56 AM

## 2020-01-28 NOTE — Interval H&P Note (Signed)
History and Physical Interval Note:  01/28/2020 8:16 AM  Caleb Bell  has presented today for surgery, with the diagnosis of AFIB.  The various methods of treatment have been discussed with the patient and family. After consideration of risks, benefits and other options for treatment, the patient has consented to  Procedure(s): CARDIOVERSION (N/A) as a surgical intervention.  The patient's history has been reviewed, patient examined, no change in status, stable for surgery.  I have reviewed the patient's chart and labs.  Questions were answered to the patient's satisfaction.    DCCV for afib. NPO since midnight. Eliquis >3 weeks and no missed doses.  Caleb Bell, [place]  [address], [place], [place] [address] [phone]  8:16 AM

## 2020-01-28 NOTE — Progress Notes (Signed)
Patient admitted to endo for direct current cardioversion.  Pacemaker interrogated with patient in NSR.  MD notified.  12 lead EKG obtained per MD.  MD in room to discuss results with patient.  Procedure cancelled and discharge home per MD.

## 2020-01-31 ENCOUNTER — Telehealth: Payer: Self-pay

## 2020-01-31 NOTE — Telephone Encounter (Signed)
Merlin alert- Alert for exceeded AT/AF burden & long AT/AF episode. AF burden = 54%; EGMs suggest AF - ongoing; V rates controlled. BiV pacing 73%. History of PAF and apixaban, carvedilol and digoxin. Per notes canceled DCCV on 01/28/2020 due to spontaneous conversion.   Spoke with pt, he is asymptomatic at this time.  He will send a remote transmission once he is done with work today.    Pt scheduled for f/u visit in AF clinic on 1/14, requests information as well as updated transmission be forwarded to them ahead of his visit.

## 2020-02-01 NOTE — Telephone Encounter (Signed)
01/31/20 transmission showed AF as presenting EGM . AT/AF burden 55%.

## 2020-02-02 NOTE — Telephone Encounter (Signed)
Will assess tomorrow for follow up appointment with Roderic Palau NP 1/14.

## 2020-02-04 ENCOUNTER — Ambulatory Visit (HOSPITAL_COMMUNITY)
Admission: RE | Admit: 2020-02-04 | Discharge: 2020-02-04 | Disposition: A | Discharge status: Home / Self Care | Payer: Managed Care, Other (non HMO) | Source: Ambulatory Visit | Attending: Nurse Practitioner | Admitting: Nurse Practitioner

## 2020-02-04 ENCOUNTER — Other Ambulatory Visit: Payer: Self-pay

## 2020-02-04 ENCOUNTER — Encounter (HOSPITAL_COMMUNITY): Payer: Self-pay | Admitting: Nurse Practitioner

## 2020-02-04 VITALS — BP 116/72 | HR 61 | Ht 73.0 in | Wt 204.0 lb

## 2020-02-04 DIAGNOSIS — Z7901 Long term (current) use of anticoagulants: Secondary | ICD-10-CM | POA: Insufficient documentation

## 2020-02-04 DIAGNOSIS — I4891 Unspecified atrial fibrillation: Secondary | ICD-10-CM | POA: Diagnosis not present

## 2020-02-04 DIAGNOSIS — E785 Hyperlipidemia, unspecified: Secondary | ICD-10-CM | POA: Diagnosis not present

## 2020-02-04 DIAGNOSIS — Z951 Presence of aortocoronary bypass graft: Secondary | ICD-10-CM | POA: Insufficient documentation

## 2020-02-04 DIAGNOSIS — I4892 Unspecified atrial flutter: Secondary | ICD-10-CM | POA: Diagnosis not present

## 2020-02-04 DIAGNOSIS — I4819 Other persistent atrial fibrillation: Secondary | ICD-10-CM | POA: Insufficient documentation

## 2020-02-04 DIAGNOSIS — Z95810 Presence of automatic (implantable) cardiac defibrillator: Secondary | ICD-10-CM | POA: Diagnosis not present

## 2020-02-04 DIAGNOSIS — I255 Ischemic cardiomyopathy: Secondary | ICD-10-CM | POA: Diagnosis not present

## 2020-02-04 DIAGNOSIS — D6869 Other thrombophilia: Secondary | ICD-10-CM | POA: Diagnosis not present

## 2020-02-04 DIAGNOSIS — I2510 Atherosclerotic heart disease of native coronary artery without angina pectoris: Secondary | ICD-10-CM | POA: Insufficient documentation

## 2020-02-04 DIAGNOSIS — I252 Old myocardial infarction: Secondary | ICD-10-CM | POA: Diagnosis not present

## 2020-02-04 NOTE — Progress Notes (Signed)
Primary Care Physician: Lavone Orn, MD Referring Physician: Dr. Marilynn Rail is a 65 y.o. male with a h/o DM,  ischemic cardiomyopathy with prior bypass surgery and an IVCD for which he underwent CRT-D 3/13 with generator change 2/19, paroxysmal afib.    He was seen by Dr. Caryl Comes  12/3 and afib was noted at 7.3% burden by device report. . At that visit, it  was decided since pt was asymptomatic to follow afib burden for then. He was then contacted by the device clinic,after Merlin alert, and was found to be in persistent  afib since 01/07/20.   He was referred to the afib clinic, to consider CV vrs Tikosyn vrs ablation, per Dr. Caryl Comes. Today, EKG shows atrial flutter with variable block, controlled at 80 bpm. . He is fairly asymptomatic. He noted some mild exertional shortness of breath over the weekend. No fatigue. He has had all 3 covid vaccines. No missed anticoagulation for at least 3 weeks.. CHA2DS2VASc score is of least 4. Denies  alcohol use, mod caffeine use, no snoring/apnea per pt. Discussed options with pt and he would like to proceed to CV. No previous cardioversion's.   F/u in afib clinic, 02/04/20. Pt presented in SR at time of CV so it was cancelled. He is here for f/u and remains a paced. He did get one call from  device  Clinic since then  that he had another spell of afib from  which he was asymptomatic. He does not feel any different with afib episodes. Discussed  tikosyn with him briefly but he does not feel a real urgency to do anything since he is asymptomatic.   Today, he denies symptoms of palpitations, chest pain,  orthopnea, PND, lower extremity edema, dizziness, presyncope, syncope, or neurologic sequela. The patient is tolerating medications without difficulties and is otherwise without complaint today.   Past Medical History:  Diagnosis Date  . Atrial fibrillation (HCC)    Paroxysmal, limited  . CAD (coronary artery disease)    CABG 2003  . Chronic  systolic heart failure (HCC)    EF 20-25% echo 5- 2011  . CRI (chronic renal insufficiency)     multifactorial... Dr. Posey Pronto.. consult September 18, 2009  . DM type 2 (diabetes mellitus, type 2) (HCC)    Dr Loanne Drilling  . Ejection fraction < 50%    EF 25%, echo, November, 2012  //   planning followup 2-D echo to assess LV function with CRT D.  device in place  . Elevated serum creatinine   . Epididymal cyst 2017   right, no testis masses  . Hodgkin's disease     Dx aprox 2004, s/p XRT-Chemo/had radiation too  . Hyperkalemia    August, 2011, Aldactone, Aldactone stopped  . Hyperlipidemia    low HDL  . Hypothyroidism   . ICD (implantable cardiac defibrillator) in place    CRT-D placed March, 2013  . Ischemic cardiomyopathy    MRI12/12 no real viability in hypo/akinetic segment  CAth native and graft disease  . IVCD (intraventricular conduction defect)   . Myocardial infarction ([place])    mild MI/ several MI per pt in the past.  . Personal history of colonic adenomas 12/24/2011  . Polysubstance    Alcohol, cocaine- resoloved for many years    Past Surgical History:  Procedure Laterality Date  . BI-VENTRICULAR IMPLANTABLE CARDIOVERTER DEFIBRILLATOR N/A 04/01/2011   Procedure: BI-VENTRICULAR IMPLANTABLE CARDIOVERTER DEFIBRILLATOR  (CRT-D);  Surgeon: Deboraha Sprang, MD;  Location: [facility];  Service: Cardiovascular;  Laterality: N/A;  . BIV ICD GENERATOR CHANGEOUT N/A 03/10/2017   Procedure: BIV ICD GENERATOR CHANGEOUT;  Surgeon: Deboraha Sprang, MD;  Location: [facility] CV [facility];  Service: Cardiovascular;  Laterality: N/A;  . CARDIAC DEFIBRILLATOR PLACEMENT  03/2011  . CATARACT EXTRACTION    . COLONOSCOPY  12/24/2011   Procedure: COLONOSCOPY;  Surgeon: [name], MD;  Location: [facility];  Service: Endoscopy;  Laterality: N/A;  . COLONOSCOPY WITH PROPOFOL N/A 06/30/2017   Procedure: COLONOSCOPY WITH PROPOFOL;  Surgeon: [name], MD;  Location: [facility];  Service: Endoscopy;   Laterality: N/A;  . CORONARY ARTERY BYPASS GRAFT  2003  . CYSTECTOMY     Upper Back  . POLYPECTOMY  06/30/2017   Procedure: POLYPECTOMY;  Surgeon: [name], MD;  Location: [facility];  Service: Endoscopy;;  . TONSILLECTOMY      Current Outpatient Medications  Medication Sig Dispense Refill  . acetaminophen (TYLENOL) 500 MG tablet Take 500-1,000 mg by mouth every 8 (eight) hours as needed for headache.    Marland Kitchen atorvastatin (LIPITOR) 80 MG tablet Take 80 mg by mouth at bedtime.   6  . BAYER MICROLET LANCETS lancets USE TO CHECK BLOOD SUGAR 2 TIMES PER DAY. 100 each 12  . carvedilol (COREG) 12.5 MG tablet TAKE 1 TABLET BY MOUTH TWICE DAILY WITH A MEAL, (Patient taking differently: Take 12.5 mg by mouth in the morning and at bedtime.) 180 tablet 3  . Cholecalciferol (VITAMIN D-3) 125 MCG (5000 UT) TABS Take 5,000 Units by mouth daily.    . digoxin (LANOXIN) 0.125 MG tablet TAKE ONE TABLET BY MOUTH ONE TIME DAILY. (Patient taking differently: Take 0.125 mg by mouth daily.) 90 tablet 1  . ELIQUIS 5 MG TABS tablet TAKE 1 TABLET(5 MG) BY MOUTH TWICE DAILY (Patient taking differently: Take 5 mg by mouth 2 (two) times daily.) 180 tablet 2  . furosemide (LASIX) 20 MG tablet Take 20 mg by mouth daily.    Marland Kitchen glucose blood (CONTOUR NEXT TEST) test strip 1 each by Other route in the morning and at bedtime. And lancets 2/day 200 each 3  . insulin aspart (NOVOLOG FLEXPEN) 100 UNIT/ML FlexPen THREE TIMES DAILY(JUST BEFORE EACH MEAL) 17-15-35 units (Patient taking differently: Inject 15-35 Units into the skin with breakfast, with lunch, and with evening meal. Inject 17 units subcutaneously before breakfast, inject 15 units subcutaneously before lunch, & inject 35 units subcutaneously before supper.) 60 mL 4  . insulin detemir (LEVEMIR FLEXTOUCH) 100 UNIT/ML FlexPen Inject 5 Units into the skin at bedtime. 15 mL 11  . Insulin Pen Needle (NOVOFINE) 32G X 6 MM MISC 1 each by Other route 3 (three) times daily.  E11.9 100 each 2  . levothyroxine (SYNTHROID, LEVOTHROID) 88 MCG tablet Take 1 tablet (88 mcg total) by mouth daily before breakfast. 30 tablet 0  . sacubitril-valsartan (ENTRESTO) 24-26 MG Take 1 tablet by mouth 2 (two) times daily. 60 tablet 11  . spironolactone (ALDACTONE) 25 MG tablet TAKE 1/2 TABLET BY MOUTH DAILY (Patient taking differently: Take 12.5 mg by mouth daily.) 45 tablet 0   No current facility-administered medications for this encounter.    No Known Allergies  Social History   Socioeconomic History  . Marital status: Married    Spouse name: Not on file  . Number of children: 0  . Years of education: Not on file  . Highest education level: Not on file  Occupational History  .  Occupation: musician  Tobacco Use  . Smoking status: Never Smoker  . Smokeless tobacco: Never Used  Vaping Use  . Vaping Use: Never used  Substance and Sexual Activity  . Alcohol use: No    Alcohol/week: 0.0 standard drinks    Comment: Hx of abuse/clean since 2003   . Drug use: No    Comment: Hx of abuse/clean for many years  . Sexual activity: Not on file  Other Topics Concern  . Not on file  Social History Narrative   Musician   Single, no children, lives by himself   Alcohol use-no (hx of abuse clean since 2003)      Drug use-no (hx of abuse clean x years)     Social Determinants of Health   Financial Resource Strain: Not on file  Food Insecurity: Not on file  Transportation Needs: Not on file  Physical Activity: Not on file  Stress: Not on file  Social Connections: Not on file  Intimate Partner Violence: Not on file    Family History  Adopted: Yes  Family history unknown: Yes    ROS- All systems are reviewed and negative except as per the HPI above  Physical Exam: Vitals:   02/04/20 1134  BP: 116/72  Pulse: 61  Weight: 92.5 kg  Height: 6\' 1"  (1.854 m)   Wt Readings from Last 3 Encounters:  02/04/20 92.5 kg  01/17/20 93.6 kg  01/07/20 91.6 kg     Labs: Lab Results  Component Value Date   NA 133 (L) 01/17/2020   K 4.5 01/17/2020   CL 102 01/17/2020   CO2 24 01/17/2020   GLUCOSE 257 (H) 01/17/2020   BUN 32 (H) 01/17/2020   CREATININE 1.57 (H) 01/17/2020   CALCIUM 8.9 01/17/2020   MG 1.9 09/22/2014   Lab Results  Component Value Date   INR 1.0 03/25/2011   Lab Results  Component Value Date   CHOL 206 (H) 12/24/2019   HDL 34 (L) 12/24/2019   LDLCALC 102 (H) 12/24/2019   TRIG 411 (H) 12/24/2019     GEN- The patient is well appearing, alert and oriented x 3 today.   Head- normocephalic, atraumatic Eyes-  Sclera clear, conjunctiva pink Ears- hearing intact Oropharynx- clear Neck- supple, no JVP Lymph- no cervical lymphadenopathy Lungs- Clear to ausculation bilaterally, normal work of breathing Heart- regular rate and rhythm, no murmurs, rubs or gallops, PMI not laterally displaced GI- soft, NT, ND, + BS Extremities- no clubbing, cyanosis, or edema MS- no significant deformity or atrophy Skin- no rash or lesion Psych- euthymic mood, full affect Neuro- strength and sensation are intact  EKG- av paced rhythm  Vent. rate 61 BPM PR interval 196 ms QRS duration 162 ms QT/QTc 442/444 ms P-R-T axes -4 127 -51       Assessment and Plan: 1. Persistent afib/flutter  Set up for DCCV but he was in rhythm at time of CV In rhythm today  Discussed options with pt, primarily Tikosyn    Because of the hold on elective procedures 2/2 covid  and a list for Tikosyn  admits, it could be late February, early March before this could occur  He would appreciate to further discuss with Dr. Caryl Comes as he is asymptomatic and really does not want a hospital stay  Continue digoxin and carvedilol at current doses    2. CHA2DS2VASc score of at least 4 States no missed doses of eliquis Continue  eliquis 5 mg bid   Appointment  requested with  Dr. Caryl Comes  to further discuss options   Geroge Baseman. Tiffane Sheldon, [facility] [address] [place], [place] [address] [phone]

## 2020-02-18 ENCOUNTER — Other Ambulatory Visit: Payer: Managed Care, Other (non HMO) | Admitting: *Deleted

## 2020-02-18 ENCOUNTER — Other Ambulatory Visit: Payer: Self-pay

## 2020-02-18 DIAGNOSIS — I2510 Atherosclerotic heart disease of native coronary artery without angina pectoris: Secondary | ICD-10-CM

## 2020-02-18 DIAGNOSIS — Z79899 Other long term (current) drug therapy: Secondary | ICD-10-CM

## 2020-02-18 DIAGNOSIS — I480 Paroxysmal atrial fibrillation: Secondary | ICD-10-CM

## 2020-02-19 LAB — LIPID PANEL
Chol/HDL Ratio: 4.7 ratio (ref 0.0–5.0)
Cholesterol, Total: 169 mg/dL (ref 100–199)
HDL: 36 mg/dL — ABNORMAL LOW (ref >39)
LDL Chol Calc (NIH): 87 mg/dL (ref 0–99)
Triglycerides: 274 mg/dL — ABNORMAL HIGH (ref 0–149)
VLDL Cholesterol Cal: 46 mg/dL — ABNORMAL HIGH (ref 5–40)

## 2020-02-19 LAB — DIGOXIN LEVEL: Digoxin, Serum: 1.1 ng/mL — ABNORMAL HIGH (ref 0.5–0.9)

## 2020-03-03 ENCOUNTER — Telehealth: Payer: Self-pay

## 2020-03-03 DIAGNOSIS — R7889 Finding of other specified substances, not normally found in blood: Secondary | ICD-10-CM

## 2020-03-03 DIAGNOSIS — Z79899 Other long term (current) drug therapy: Secondary | ICD-10-CM

## 2020-03-03 DIAGNOSIS — E782 Mixed hyperlipidemia: Secondary | ICD-10-CM

## 2020-03-03 MED ORDER — DIGOXIN 125 MCG PO TABS: 0.1250 mg | ORAL_TABLET | ORAL | 3 refills | Status: AC

## 2020-03-03 NOTE — Telephone Encounter (Signed)
Spoke with pt and advised per Dr Caryl Comes labs show elevated Dig level and elevated LDL.  Pt states he was not fasting the day labs were drawn.  Pt advised per Dr Caryl Comes He will need to take Digoxin 0.0625mg  and can reduce his current dose of 0.125mg  to 1 tablet by mouth every other day.  Appointment scheduled for repeat labs on 03/17/2020.  Pt verbalizes understanding and agrees with current plan.     0.0625

## 2020-03-08 ENCOUNTER — Telehealth: Payer: Self-pay

## 2020-03-08 ENCOUNTER — Ambulatory Visit (INDEPENDENT_AMBULATORY_CARE_PROVIDER_SITE_OTHER): Payer: Managed Care, Other (non HMO)

## 2020-03-08 DIAGNOSIS — I255 Ischemic cardiomyopathy: Secondary | ICD-10-CM | POA: Diagnosis not present

## 2020-03-08 LAB — CUP PACEART REMOTE DEVICE CHECK
Battery Remaining Longevity: 51 mo
Battery Remaining Percentage: 54 %
Battery Voltage: 2.95 V
Brady Statistic AP VP Percent: 82 %
Brady Statistic AP VS Percent: 1.7 %
Brady Statistic AS VP Percent: 13 %
Brady Statistic AS VS Percent: 1 %
Brady Statistic RA Percent Paced: 51 %
Date Time Interrogation Session: 20220216040015
HighPow Impedance: 66 Ohm
HighPow Impedance: 66 Ohm
Implantable Lead Implant Date: 20130311
Implantable Lead Implant Date: 20130311
Implantable Lead Implant Date: 20130311
Implantable Lead Location: 753858
Implantable Lead Location: 753859
Implantable Lead Location: 753860
Implantable Lead Model: 7122
Implantable Pulse Generator Implant Date: 20190218
Lead Channel Impedance Value: 330 Ohm
Lead Channel Impedance Value: 350 Ohm
Lead Channel Impedance Value: 710 Ohm
Lead Channel Pacing Threshold Amplitude: 0.875 V
Lead Channel Pacing Threshold Amplitude: 0.875 V
Lead Channel Pacing Threshold Amplitude: 3.25 V
Lead Channel Pacing Threshold Pulse Width: 0.5 ms
Lead Channel Pacing Threshold Pulse Width: 0.5 ms
Lead Channel Pacing Threshold Pulse Width: 1.5 ms
Lead Channel Sensing Intrinsic Amplitude: 12 mV
Lead Channel Sensing Intrinsic Amplitude: 4.8 mV
Lead Channel Setting Pacing Amplitude: 0.25 V
Lead Channel Setting Pacing Amplitude: 1.875
Lead Channel Setting Pacing Amplitude: 2 V
Lead Channel Setting Pacing Pulse Width: 0.05 ms
Lead Channel Setting Pacing Pulse Width: 0.5 ms
Lead Channel Setting Sensing Sensitivity: 0.5 mV
Pulse Gen Serial Number: 9780477

## 2020-03-08 NOTE — Telephone Encounter (Signed)
Per AF visit on 1/14 with Roderic Palau NP --- Discussed options with pt, primarily Tikosyn because of the hold on elective procedures 2/2 covid  and a list for Tikosyn  admits, it could be late February, early March before this could occur  He would appreciate to further discuss with Dr. Caryl Comes as he is asymptomatic and really does not want a hospital stay. Pt is scheduled for appt with Dr. Caryl Comes in March. If pt is symptomatic would recommend moving up this appointment as pt did not want to make any changes until consulting with Dr. Caryl Comes.

## 2020-03-08 NOTE — Telephone Encounter (Signed)
Merlin alert received for on-going AF since 2/9.  Notes indicate a DCCV was previously planned however pt in SR at time of procedure.  There is question about possiblity of Tikosyn loading.    Attempted to reach pt to assess symptoms and med compliance.  No answer.  Left detailed message with DC # to return call if any questions.  Also noted will forward to AF clinic for further management.

## 2020-03-13 NOTE — Telephone Encounter (Signed)
Pt scheduled for virtual visit with Dr Caryl Comes on 03/14/2020.

## 2020-03-14 ENCOUNTER — Other Ambulatory Visit: Payer: Self-pay

## 2020-03-14 ENCOUNTER — Telehealth (INDEPENDENT_AMBULATORY_CARE_PROVIDER_SITE_OTHER): Payer: Managed Care, Other (non HMO) | Admitting: Internal Medicine

## 2020-03-14 DIAGNOSIS — I480 Paroxysmal atrial fibrillation: Secondary | ICD-10-CM | POA: Diagnosis not present

## 2020-03-14 DIAGNOSIS — I5022 Chronic systolic (congestive) heart failure: Secondary | ICD-10-CM

## 2020-03-14 NOTE — Progress Notes (Signed)
Remote ICD transmission.   

## 2020-03-14 NOTE — Progress Notes (Signed)
Electrophysiology TeleHealth Note   Due to national recommendations of social distancing due to COVID 19, an audio/video telehealth visit is felt to be most appropriate for this patient at this time.  See MyChart message from today for the patient's consent to telehealth for [facility].   Date:  03/14/2020   ID:  Caleb Bell, DOB [birth year], MRN [id number]  Location: patient's home  Provider location: [address], [place]  Evaluation Performed: Follow-up visit  PCP:  Lavone Orn, MD  Cardiologist:   [place] Electrophysiologist:  SK   Chief Complaint:  *AFib  History of Present Illness:    Caleb Bell is a 65 y.o. male who presents via audio/video conferencing for a telehealth visit today.  Since last being seen in our clinic for afib and having been seen at AFib clinic  the patient reports feeling fine.  He has had no symptoms assoc with afib of which he is aware, could tell no difference in exercise tolerance from when he was found in sinus versus in afib   Seen in follow-up for ischemic cardiomyopathy with prior bypass surgery and an IVCD for which he underwent CRT-D 3/13 with generator change 2/19.    There have been change in RV pacing threshold which would raise the issue as to whether an RV lead revision will be necessary; at time of revision it was elected not to   Device interrogation demonstrates an abrupt change in auto capture threshold in the last month  Date RV threshold  2016 1.25/0.8  2/19 2.0/0.8  5/19 1.875/0.8  11/20 3V/0.8    Remote history also notable for Hodgkin's disease and PAF.   DATE TEST EF   10/13 Echo 20-25%   12/15 Echo 20.25%   2/19 Echo 30-35%         Date Cr K Dig Hgb  2/19 1.39 4.7  12.9   9/20 1.53 4.7 0.8 12.7  6/21 1.7        The patient denies symptoms of fevers, chills, cough, or new SOB worrisome for COVID 19.    Past Medical History:  Diagnosis Date  . Atrial  fibrillation (HCC)    Paroxysmal, limited  . CAD (coronary artery disease)    CABG 2003  . Chronic systolic heart failure (HCC)    EF 20-25% echo 5- 2011  . CRI (chronic renal insufficiency)     multifactorial... Dr. Posey Pronto.. consult September 18, 2009  . DM type 2 (diabetes mellitus, type 2) (HCC)    Dr Loanne Drilling  . Ejection fraction < 50%    EF 25%, echo, November, 2012  //   planning followup 2-D echo to assess LV function with CRT D.  device in place  . Elevated serum creatinine   . Epididymal cyst 2017   right, no testis masses  . Hodgkin's disease     Dx aprox 2004, s/p XRT-Chemo/had radiation too  . Hyperkalemia    August, 2011, Aldactone, Aldactone stopped  . Hyperlipidemia    low HDL  . Hypothyroidism   . ICD (implantable cardiac defibrillator) in place    CRT-D placed March, 2013  . Ischemic cardiomyopathy    MRI12/12 no real viability in hypo/akinetic segment  CAth native and graft disease  . IVCD (intraventricular conduction defect)   . Myocardial infarction ([place])    mild MI/ several MI per pt in the past.  . Personal history of colonic adenomas 12/24/2011  . Polysubstance    Alcohol, cocaine-  resoloved for many years     Past Surgical History:  Procedure Laterality Date  . BI-VENTRICULAR IMPLANTABLE CARDIOVERTER DEFIBRILLATOR N/A 04/01/2011   Procedure: BI-VENTRICULAR IMPLANTABLE CARDIOVERTER DEFIBRILLATOR  (CRT-D);  Surgeon: Deboraha Sprang, MD;  Location: [facility] CATH LAB;  Service: Cardiovascular;  Laterality: N/A;  . BIV ICD GENERATOR CHANGEOUT N/A 03/10/2017   Procedure: BIV ICD GENERATOR CHANGEOUT;  Surgeon: Deboraha Sprang, MD;  Location: [facility] CV LAB;  Service: Cardiovascular;  Laterality: N/A;  . CARDIAC DEFIBRILLATOR PLACEMENT  03/2011  . CATARACT EXTRACTION    . COLONOSCOPY  12/24/2011   Procedure: COLONOSCOPY;  Surgeon: [name], MD;  Location: [facility];  Service: Endoscopy;  Laterality: N/A;  . COLONOSCOPY WITH PROPOFOL N/A 06/30/2017   Procedure:  COLONOSCOPY WITH PROPOFOL;  Surgeon: [name], MD;  Location: [facility];  Service: Endoscopy;  Laterality: N/A;  . CORONARY ARTERY BYPASS GRAFT  2003  . CYSTECTOMY     Upper Back  . POLYPECTOMY  06/30/2017   Procedure: POLYPECTOMY;  Surgeon: [name], MD;  Location: [facility];  Service: Endoscopy;;  . TONSILLECTOMY      Current Outpatient Medications  Medication Sig Dispense Refill  . acetaminophen (TYLENOL) 500 MG tablet Take 500-1,000 mg by mouth every 8 (eight) hours as needed for headache.    Marland Kitchen atorvastatin (LIPITOR) 80 MG tablet Take 80 mg by mouth at bedtime.   6  . BAYER MICROLET LANCETS lancets USE TO CHECK BLOOD SUGAR 2 TIMES PER DAY. 100 each 12  . carvedilol (COREG) 12.5 MG tablet TAKE 1 TABLET BY MOUTH TWICE DAILY WITH A MEAL, (Patient taking differently: Take 12.5 mg by mouth in the morning and at bedtime.) 180 tablet 3  . Cholecalciferol (VITAMIN D-3) 125 MCG (5000 UT) TABS Take 5,000 Units by mouth daily.    . digoxin (LANOXIN) 0.125 MG tablet Take 1 tablet (0.125 mg total) by mouth every other day. 45 tablet 3  . ELIQUIS 5 MG TABS tablet TAKE 1 TABLET(5 MG) BY MOUTH TWICE DAILY (Patient taking differently: Take 5 mg by mouth 2 (two) times daily.) 180 tablet 2  . furosemide (LASIX) 20 MG tablet Take 20 mg by mouth daily.    Marland Kitchen glucose blood (CONTOUR NEXT TEST) test strip 1 each by Other route in the morning and at bedtime. And lancets 2/day 200 each 3  . insulin aspart (NOVOLOG FLEXPEN) 100 UNIT/ML FlexPen THREE TIMES DAILY(JUST BEFORE EACH MEAL) 17-15-35 units (Patient taking differently: Inject 15-35 Units into the skin with breakfast, with lunch, and with evening meal. Inject 17 units subcutaneously before breakfast, inject 15 units subcutaneously before lunch, & inject 35 units subcutaneously before supper.) 60 mL 4  . insulin detemir (LEVEMIR FLEXTOUCH) 100 UNIT/ML FlexPen Inject 5 Units into the skin at bedtime. 15 mL 11  . Insulin Pen Needle (NOVOFINE)  32G X 6 MM MISC 1 each by Other route 3 (three) times daily. E11.9 100 each 2  . levothyroxine (SYNTHROID, LEVOTHROID) 88 MCG tablet Take 1 tablet (88 mcg total) by mouth daily before breakfast. 30 tablet 0  . sacubitril-valsartan (ENTRESTO) 24-26 MG Take 1 tablet by mouth 2 (two) times daily. 60 tablet 11  . spironolactone (ALDACTONE) 25 MG tablet TAKE 1/2 TABLET BY MOUTH DAILY (Patient taking differently: Take 12.5 mg by mouth daily.) 45 tablet 0   No current facility-administered medications for this visit.    Allergies:   Patient has no known allergies.   Social History:  The patient  reports that he has never smoked. He has never used smokeless tobacco. He reports that he does not drink alcohol and does not use drugs.   Family History:  The patient's   He was adopted. Family history is unknown by patient.   ROS:  Please see the history of present illness.   All other systems are personally reviewed and negative.    Exam:    Vital Signs:  Pulse 86   Ht 6\' 1"  (1.854 m)   Wt 200 lb (90.7 kg)   BMI 26.39 kg/m    Well appearing, alert and conversant, regular work of breathing,  good skin color Eyes- anicteric, neuro- grossly intact, skin- no apparent rash or lesions or cyanosis, mouth- oral mucosa is pink   Labs/Other Tests and Data Reviewed:    Recent Labs: 01/17/2020: BUN 32; Creatinine, Ser 1.57; Hemoglobin 12.4; Platelets 187; Potassium 4.5; Sodium 133; TSH 3.056   Wt Readings from Last 3 Encounters:  03/14/20 200 lb (90.7 kg)  02/04/20 204 lb (92.5 kg)  01/17/20 206 lb 6.4 oz (93.6 kg)     Other studies personally reviewed: Additional studies/ records that were reviewed today include:    ASSESSMENT & PLAN:     Atrial fibrillation paroxysmal 7.3 %   Ischemic cardiomyopathy  Congestive heart failure-chronic systolic  CRT-D.-[employer]    VT/PVCs  Thromboembolic risk factors diabetes hypertension vascular idisease and CHF   CHADS-VASc score of  greater than or equal to 4  RV lead--elevated threshold now programmed subthreshold   Recurrent atrial fibrillation--without symptoms--and so not clear as to the right next stage.   From a guideline point of view--symptoms would dictate intervention either drug or ablation.  However the [place] analysis suggests that there is mortality and morbidity benefit in CABANA like patients taht would favor intervention in pts, particularly with CASTLE in people with depressed LV function in ablation ( in my mind greater than drugs)  He will think about this with his wife and let me know which is his philosophical bent, to leave things alone or to fix what can be.       COVID 19 screen The patient denies symptoms of COVID 19 at this time.  The importance of social distancing was discussed today.  Follow-up: 97m    Current medicines are reviewed at length with the patient today.   The patient does not have concerns regarding his medicines.  The following changes were made today:  none  Labs/ tests ordered today include: none No orders of the defined types were placed in this encounter.   Future tests ( post COVID )    Patient Risk:  after full review of this patients clinical status, I feel that they are at moderate risk at this time.  Today, I have spent 18 minutes with the patient with telehealth technology discussing the above.  Signed, Virl Axe, MD  03/14/2020 3:13 PM     [address] [place] [address] [phone] (office) [phone] (fax)

## 2020-03-15 NOTE — Patient Instructions (Signed)

## 2020-03-17 ENCOUNTER — Other Ambulatory Visit: Payer: Managed Care, Other (non HMO)

## 2020-03-17 ENCOUNTER — Other Ambulatory Visit: Payer: Managed Care, Other (non HMO) | Admitting: *Deleted

## 2020-03-17 ENCOUNTER — Other Ambulatory Visit: Payer: Self-pay

## 2020-03-17 DIAGNOSIS — E782 Mixed hyperlipidemia: Secondary | ICD-10-CM

## 2020-03-17 DIAGNOSIS — R7889 Finding of other specified substances, not normally found in blood: Secondary | ICD-10-CM

## 2020-03-17 DIAGNOSIS — Z79899 Other long term (current) drug therapy: Secondary | ICD-10-CM

## 2020-03-18 LAB — LIPID PANEL
Chol/HDL Ratio: 4.9 ratio (ref 0.0–5.0)
Cholesterol, Total: 168 mg/dL (ref 100–199)
HDL: 34 mg/dL — ABNORMAL LOW (ref >39)
LDL Chol Calc (NIH): 93 mg/dL (ref 0–99)
Triglycerides: 240 mg/dL — ABNORMAL HIGH (ref 0–149)
VLDL Cholesterol Cal: 41 mg/dL — ABNORMAL HIGH (ref 5–40)

## 2020-03-18 LAB — DIGOXIN LEVEL: Digoxin, Serum: 0.4 ng/mL — ABNORMAL LOW (ref 0.5–0.9)

## 2020-03-24 LAB — HM DIABETES EYE EXAM

## 2020-04-07 ENCOUNTER — Other Ambulatory Visit: Payer: Self-pay

## 2020-04-07 ENCOUNTER — Ambulatory Visit: Payer: Managed Care, Other (non HMO) | Admitting: Endocrinology

## 2020-04-07 VITALS — BP 100/64 | HR 108 | Ht 72.0 in | Wt 204.4 lb

## 2020-04-07 DIAGNOSIS — N1831 Chronic kidney disease, stage 3a: Secondary | ICD-10-CM | POA: Diagnosis not present

## 2020-04-07 DIAGNOSIS — Z794 Long term (current) use of insulin: Secondary | ICD-10-CM | POA: Diagnosis not present

## 2020-04-07 DIAGNOSIS — E1122 Type 2 diabetes mellitus with diabetic chronic kidney disease: Secondary | ICD-10-CM

## 2020-04-07 DIAGNOSIS — N183 Chronic kidney disease, stage 3 unspecified: Secondary | ICD-10-CM | POA: Diagnosis not present

## 2020-04-07 LAB — POCT GLYCOSYLATED HEMOGLOBIN (HGB A1C): Hemoglobin A1C: 7.2 % — AB (ref 4.0–5.6)

## 2020-04-07 MED ORDER — LEVEMIR FLEXTOUCH 100 UNIT/ML ~~LOC~~ SOPN: 6.0000 [IU] | PEN_INJECTOR | Freq: Every day | SUBCUTANEOUS | 11 refills | Status: DC

## 2020-04-07 MED ORDER — NOVOLOG FLEXPEN 100 UNIT/ML ~~LOC~~ SOPN: PEN_INJECTOR | SUBCUTANEOUS | 4 refills | Status: DC

## 2020-04-07 NOTE — Progress Notes (Signed)
Subjective:    Patient ID: Caleb Bell, male    DOB: 1955/12/07, 65 y.o.   MRN: [id number]  HPI Pt returns for f/u of diabetes mellitus:  DM type: 1 Dx'ed: 5188 Complications: PN, stage 3 CRI, foot ulcer, DR, PAD, and CAD.   Therapy: insulin since 2010.   DKA: never.   Severe hypoglycemia: never.    Pancreatitis: never.   Other: he takes multiple daily injections; he declines pump therapy; he declines to add oral rx  Interval history: pt states he feels well in general.  Meter is downloaded today, and the printout is scanned into the record.  cbg varies from 148-265.  There is no trend throughout the day.   Past Medical History:  Diagnosis Date  . Atrial fibrillation (HCC)    Paroxysmal, limited  . CAD (coronary artery disease)    CABG 2003  . Chronic systolic heart failure (HCC)    EF 20-25% echo 5- 2011  . CRI (chronic renal insufficiency)     multifactorial... Dr. Posey Pronto.. consult September 18, 2009  . DM type 2 (diabetes mellitus, type 2) (HCC)    Dr Loanne Drilling  . Ejection fraction < 50%    EF 25%, echo, November, 2012  //   planning followup 2-D echo to assess LV function with CRT D.  device in place  . Elevated serum creatinine   . Epididymal cyst 2017   right, no testis masses  . Hodgkin's disease     Dx aprox 2004, s/p XRT-Chemo/had radiation too  . Hyperkalemia    August, 2011, Aldactone, Aldactone stopped  . Hyperlipidemia    low HDL  . Hypothyroidism   . ICD (implantable cardiac defibrillator) in place    CRT-D placed March, 2013  . Ischemic cardiomyopathy    MRI12/12 no real viability in hypo/akinetic segment  CAth native and graft disease  . IVCD (intraventricular conduction defect)   . Myocardial infarction ([place])    mild MI/ several MI per pt in the past.  . Personal history of colonic adenomas 12/24/2011  . Polysubstance    Alcohol, cocaine- resoloved for many years     Past Surgical History:  Procedure Laterality Date  . BI-VENTRICULAR IMPLANTABLE  CARDIOVERTER DEFIBRILLATOR N/A 04/01/2011   Procedure: BI-VENTRICULAR IMPLANTABLE CARDIOVERTER DEFIBRILLATOR  (CRT-D);  Surgeon: Deboraha Sprang, MD;  Location: [facility] CATH LAB;  Service: Cardiovascular;  Laterality: N/A;  . BIV ICD GENERATOR CHANGEOUT N/A 03/10/2017   Procedure: BIV ICD GENERATOR CHANGEOUT;  Surgeon: Deboraha Sprang, MD;  Location: [place] CV LAB;  Service: Cardiovascular;  Laterality: N/A;  . CARDIAC DEFIBRILLATOR PLACEMENT  03/2011  . CATARACT EXTRACTION    . COLONOSCOPY  12/24/2011   Procedure: COLONOSCOPY;  Surgeon: [name], MD;  Location: [facility];  Service: Endoscopy;  Laterality: N/A;  . COLONOSCOPY WITH PROPOFOL N/A 06/30/2017   Procedure: COLONOSCOPY WITH PROPOFOL;  Surgeon: [name], MD;  Location: [facility]OS[facility];  Service: Endoscopy;  Laterality: N/A;  . CORONARY ARTERY BYPASS GRAFT  2003  . CYSTECTOMY     Upper Back  . POLYPECTOMY  06/30/2017   Procedure: POLYPECTOMY;  Surgeon: [name], MD;  Location: [facility];  Service: Endoscopy;;  . TONSILLECTOMY      Social History   Socioeconomic History  . Marital status: Married    Spouse name: Not on file  . Number of children: 0  . Years of education: Not on file  . Highest education level: Not on file  Occupational History  . Occupation: musician  Tobacco Use  . Smoking status: Never Smoker  . Smokeless tobacco: Never Used  Vaping Use  . Vaping Use: Never used  Substance and Sexual Activity  . Alcohol use: No    Alcohol/week: 0.0 standard drinks    Comment: Hx of abuse/clean since 2003   . Drug use: No    Comment: Hx of abuse/clean for many years  . Sexual activity: Not on file  Other Topics Concern  . Not on file  Social History Narrative   Musician   Single, no children, lives by himself   Alcohol use-no (hx of abuse clean since 2003)      Drug use-no (hx of abuse clean x years)     Social Determinants of Health   Financial Resource Strain: Not on file  Food Insecurity:  Not on file  Transportation Needs: Not on file  Physical Activity: Not on file  Stress: Not on file  Social Connections: Not on file  Intimate Partner Violence: Not on file    Current Outpatient Medications on File Prior to Visit  Medication Sig Dispense Refill  . acetaminophen (TYLENOL) 500 MG tablet Take 500-1,000 mg by mouth every 8 (eight) hours as needed for headache.    Marland Kitchen atorvastatin (LIPITOR) 80 MG tablet Take 80 mg by mouth at bedtime.   6  . BAYER MICROLET LANCETS lancets USE TO CHECK BLOOD SUGAR 2 TIMES PER DAY. 100 each 12  . carvedilol (COREG) 12.5 MG tablet TAKE 1 TABLET BY MOUTH TWICE DAILY WITH A MEAL, (Patient taking differently: Take 12.5 mg by mouth in the morning and at bedtime.) 180 tablet 3  . Cholecalciferol (VITAMIN D-3) 125 MCG (5000 UT) TABS Take 5,000 Units by mouth daily.    . digoxin (LANOXIN) 0.125 MG tablet Take 1 tablet (0.125 mg total) by mouth every other day. 45 tablet 3  . ELIQUIS 5 MG TABS tablet TAKE 1 TABLET(5 MG) BY MOUTH TWICE DAILY (Patient taking differently: Take 5 mg by mouth 2 (two) times daily.) 180 tablet 2  . furosemide (LASIX) 20 MG tablet Take 20 mg by mouth daily.    Marland Kitchen glucose blood (CONTOUR NEXT TEST) test strip 1 each by Other route in the morning and at bedtime. And lancets 2/day 200 each 3  . Insulin Pen Needle (NOVOFINE) 32G X 6 MM MISC 1 each by Other route 3 (three) times daily. E11.9 100 each 2  . levothyroxine (SYNTHROID, LEVOTHROID) 88 MCG tablet Take 1 tablet (88 mcg total) by mouth daily before breakfast. 30 tablet 0  . sacubitril-valsartan (ENTRESTO) 24-26 MG Take 1 tablet by mouth 2 (two) times daily. 60 tablet 11  . spironolactone (ALDACTONE) 25 MG tablet TAKE 1/2 TABLET BY MOUTH DAILY (Patient taking differently: Take 12.5 mg by mouth daily.) 45 tablet 0   No current facility-administered medications on file prior to visit.    No Known Allergies  Family History  Adopted: Yes  Family history unknown: Yes    BP 100/64  (BP Location: Right Arm, Patient Position: Sitting, Cuff Size: Large)   Pulse (!) 108   Ht 6' (1.829 m)   Wt 204 lb 6.4 oz (92.7 kg)   SpO2 97%   BMI 27.72 kg/m    Review of Systems He denies hypoglycemia.     Objective:   Physical Exam VITAL SIGNS:  See vs page GENERAL: no distress Pulses: dorsalis pedis intact bilat.   MSK: no deformity of the feet CV: no  leg edema Skin:  no ulcer on the feet.  normal color and temp on the feet. Neuro: sensation is intact to touch on the feet   Lab Results  Component Value Date   HGBA1C 7.2 (A) 04/07/2020       Assessment & Plan:  Type 1 DM, with PAD: uncontrolled   Patient Instructions  I have sent a prescription to your pharmacy, to increase the Levemir to 6 units at bedtime,  and:  Please continue the same Novolog. check your blood sugar twice a day.  vary the time of day when you check, between before the 3 meals, and at bedtime.  also check if you have symptoms of your blood sugar being too high or too low.  please keep a record of the readings and bring it to your next appointment here (or you can bring the meter itself).  You can write it on any piece of paper.  please call us sooner if your blood sugar goes below 70, or if you have a lot of readings over 200. Please come back for a follow-up appointment in 4 months.

## 2020-04-07 NOTE — Patient Instructions (Addendum)
I have sent a prescription to your pharmacy, to increase the Levemir to 6 units at bedtime,  and:  Please continue the same Novolog. check your blood sugar twice a day.  vary the time of day when you check, between before the 3 meals, and at bedtime.  also check if you have symptoms of your blood sugar being too high or too low.  please keep a record of the readings and bring it to your next appointment here (or you can bring the meter itself).  You can write it on any piece of paper.  please call us sooner if your blood sugar goes below 70, or if you have a lot of readings over 200. Please come back for a follow-up appointment in 4 months.

## 2020-04-10 ENCOUNTER — Telehealth: Payer: Managed Care, Other (non HMO) | Admitting: Internal Medicine

## 2020-04-20 ENCOUNTER — Telehealth: Payer: Self-pay

## 2020-04-20 NOTE — Telephone Encounter (Signed)
Spoke with pt who reports he is doing well and states he has not yet made any decisions re:Afib treatment.  He will contact office if and when he does make a decision.

## 2020-04-20 NOTE — Telephone Encounter (Signed)
Attempted phone call to pt.  Left voicemail message to contact RN at [phone]. 

## 2020-04-20 NOTE — Telephone Encounter (Signed)
-----   Message from Deboraha Sprang, MD sent at 04/15/2020  4:44 PM EDT ----- Device remote reviewed. Remote is normal.  Battery status is good.  Lead measurements unchanged.  Histograms are appropriate.   Persistent Afib   he was going to get back with [place] about what he wanted to do and I dont see a note in the chart about this,  could you cal him please   Thanks SK

## 2020-04-20 NOTE — Telephone Encounter (Signed)
Patient returning call.

## 2020-05-01 ENCOUNTER — Telehealth: Payer: Self-pay

## 2020-05-01 MED ORDER — INSULIN LISPRO (1 UNIT DIAL) 100 UNIT/ML (KWIKPEN): PEN_INJECTOR | SUBCUTANEOUS | 3 refills | Status: DC

## 2020-05-01 NOTE — Telephone Encounter (Signed)
PA came back denied for Novolog 100u/ml flexpen and you wrote on the paper that Humalog was ok. So, I do not see where you have ordered this medication on the medication list. Do I need to order it?  Please Advise

## 2020-05-01 NOTE — Telephone Encounter (Signed)
Ok, I have sent a prescription to your pharmacy, for the change

## 2020-05-01 NOTE — Addendum Note (Signed)
Addended by: Renato Shin on: 05/01/2020 05:05 PM   Modules accepted: Orders

## 2020-05-05 ENCOUNTER — Other Ambulatory Visit: Payer: Self-pay | Admitting: Internal Medicine

## 2020-06-05 LAB — CUP PACEART REMOTE DEVICE CHECK
Battery Remaining Longevity: 47 mo
Battery Remaining Percentage: 51 %
Battery Voltage: 2.95 V
Brady Statistic AP VP Percent: 84 %
Brady Statistic AP VS Percent: 1.6 %
Brady Statistic AS VP Percent: 11 %
Brady Statistic AS VS Percent: 1 %
Brady Statistic RA Percent Paced: 65 %
Date Time Interrogation Session: 20220516020029
HighPow Impedance: 63 Ohm
HighPow Impedance: 63 Ohm
Implantable Lead Implant Date: 20130311
Implantable Lead Implant Date: 20130311
Implantable Lead Implant Date: 20130311
Implantable Lead Location: 753858
Implantable Lead Location: 753859
Implantable Lead Location: 753860
Implantable Lead Model: 7122
Implantable Pulse Generator Implant Date: 20190218
Lead Channel Impedance Value: 350 Ohm
Lead Channel Impedance Value: 350 Ohm
Lead Channel Impedance Value: 730 Ohm
Lead Channel Pacing Threshold Amplitude: 0.75 V
Lead Channel Pacing Threshold Amplitude: 0.875 V
Lead Channel Pacing Threshold Amplitude: 3.25 V
Lead Channel Pacing Threshold Pulse Width: 0.5 ms
Lead Channel Pacing Threshold Pulse Width: 0.5 ms
Lead Channel Pacing Threshold Pulse Width: 1.5 ms
Lead Channel Sensing Intrinsic Amplitude: 12 mV
Lead Channel Sensing Intrinsic Amplitude: 4.2 mV
Lead Channel Setting Pacing Amplitude: 0.25 V
Lead Channel Setting Pacing Amplitude: 1.875
Lead Channel Setting Pacing Amplitude: 2 V
Lead Channel Setting Pacing Pulse Width: 0.05 ms
Lead Channel Setting Pacing Pulse Width: 0.5 ms
Lead Channel Setting Sensing Sensitivity: 0.5 mV
Pulse Gen Serial Number: 9780477

## 2020-06-07 ENCOUNTER — Ambulatory Visit (INDEPENDENT_AMBULATORY_CARE_PROVIDER_SITE_OTHER): Payer: Managed Care, Other (non HMO)

## 2020-06-07 DIAGNOSIS — I255 Ischemic cardiomyopathy: Secondary | ICD-10-CM | POA: Diagnosis not present

## 2020-06-08 LAB — CUP PACEART REMOTE DEVICE CHECK
Battery Remaining Longevity: 46 mo
Battery Remaining Percentage: 51 %
Battery Voltage: 2.93 V
Brady Statistic AP VP Percent: 84 %
Brady Statistic AP VS Percent: 1.6 %
Brady Statistic AS VP Percent: 10 %
Brady Statistic AS VS Percent: 1 %
Brady Statistic RA Percent Paced: 65 %
Date Time Interrogation Session: 20220518052650
HighPow Impedance: 64 Ohm
HighPow Impedance: 64 Ohm
Implantable Lead Implant Date: 20130311
Implantable Lead Implant Date: 20130311
Implantable Lead Implant Date: 20130311
Implantable Lead Location: 753858
Implantable Lead Location: 753859
Implantable Lead Location: 753860
Implantable Lead Model: 7122
Implantable Pulse Generator Implant Date: 20190218
Lead Channel Impedance Value: 340 Ohm
Lead Channel Impedance Value: 350 Ohm
Lead Channel Impedance Value: 710 Ohm
Lead Channel Pacing Threshold Amplitude: 0.875 V
Lead Channel Pacing Threshold Amplitude: 1 V
Lead Channel Pacing Threshold Amplitude: 3.25 V
Lead Channel Pacing Threshold Pulse Width: 0.5 ms
Lead Channel Pacing Threshold Pulse Width: 0.5 ms
Lead Channel Pacing Threshold Pulse Width: 1.5 ms
Lead Channel Sensing Intrinsic Amplitude: 11.9 mV
Lead Channel Sensing Intrinsic Amplitude: 5 mV
Lead Channel Setting Pacing Amplitude: 0.25 V
Lead Channel Setting Pacing Amplitude: 1.875
Lead Channel Setting Pacing Amplitude: 2 V
Lead Channel Setting Pacing Pulse Width: 0.05 ms
Lead Channel Setting Pacing Pulse Width: 0.5 ms
Lead Channel Setting Sensing Sensitivity: 0.5 mV
Pulse Gen Serial Number: 9780477

## 2020-06-29 NOTE — Progress Notes (Signed)
Remote ICD transmission.   

## 2020-08-10 ENCOUNTER — Other Ambulatory Visit: Payer: Self-pay | Admitting: Internal Medicine

## 2020-08-10 NOTE — Telephone Encounter (Signed)
Eliquis 5mg  refill request received. Patient is 65 years old, weight-92.7kg, Crea-1.57 on 01/17/2020, Diagnosis-Afib, and last seen by Dr. Caryl Comes on 03/14/20 & Primary Cardiologist-Dr. Harrington Challenger on 10/11/19. Dose is appropriate based on dosing criteria. Will send in refill to requested pharmacy.

## 2020-08-11 ENCOUNTER — Encounter: Payer: Self-pay | Admitting: Endocrinology

## 2020-08-11 ENCOUNTER — Ambulatory Visit: Payer: Managed Care, Other (non HMO) | Admitting: Endocrinology

## 2020-08-11 ENCOUNTER — Other Ambulatory Visit: Payer: Self-pay

## 2020-08-11 VITALS — BP 100/60 | HR 87 | Ht 72.0 in | Wt 204.2 lb

## 2020-08-11 DIAGNOSIS — E1122 Type 2 diabetes mellitus with diabetic chronic kidney disease: Secondary | ICD-10-CM

## 2020-08-11 DIAGNOSIS — Z794 Long term (current) use of insulin: Secondary | ICD-10-CM | POA: Diagnosis not present

## 2020-08-11 DIAGNOSIS — N1831 Chronic kidney disease, stage 3a: Secondary | ICD-10-CM | POA: Diagnosis not present

## 2020-08-11 LAB — POCT GLYCOSYLATED HEMOGLOBIN (HGB A1C): Hemoglobin A1C: 7.2 % — AB (ref 4.0–5.6)

## 2020-08-11 MED ORDER — INSULIN LISPRO (1 UNIT DIAL) 100 UNIT/ML (KWIKPEN): PEN_INJECTOR | SUBCUTANEOUS | 3 refills | Status: DC

## 2020-08-11 NOTE — Progress Notes (Signed)
Subjective:    Patient ID: Caleb Bell, male    DOB: November 09, 1955, 65 y.o.   MRN: [id number]  HPI Pt returns for f/u of diabetes mellitus:  DM type: 1 Dx'ed: 123456 Complications: PN, stage 3 CRI, foot ulcer, DR, PAD, and CAD.   Therapy: insulin since 2010.   DKA: never.   Severe hypoglycemia: never.    Pancreatitis: never.   Other: he takes multiple daily injections; he declines pump therapy; he declines to add oral rx  Interval history: pt states he feels well in general.  He brings his meter with his cbg's which I have reviewed today. cbg varies from 62-325.  It is in general highest in the afternoon, and lowest at HS, and at lunch He seldom has hypoglycemia, and these episodes are mild.   Past Medical History:  Diagnosis Date   Atrial fibrillation ([place])    Paroxysmal, limited   CAD (coronary artery disease)    CABG 123456   Chronic systolic heart failure (HCC)    EF 20-25% echo 5- 2011   CRI (chronic renal insufficiency)     multifactorial... Dr. Posey Pronto.. consult September 18, 2009   DM type 2 (diabetes mellitus, type 2) ([place])    Dr Loanne Drilling   Ejection fraction < 50%    EF 25%, echo, November, 2012  //   planning followup 2-D echo to assess LV function with CRT D.  device in place   Elevated serum creatinine    Epididymal cyst 2017   right, no testis masses   Hodgkin's disease     Dx aprox 2004, s/p XRT-Chemo/had radiation too   Hyperkalemia    August, 2011, Aldactone, Aldactone stopped   Hyperlipidemia    low HDL   Hypothyroidism    ICD (implantable cardiac defibrillator) in place    CRT-D placed March, 2013   Ischemic cardiomyopathy    MRI12/12 no real viability in hypo/akinetic segment  CAth native and graft disease   IVCD (intraventricular conduction defect)    Myocardial infarction ([place])    mild MI/ several MI per pt in the past.   Personal history of colonic adenomas 12/24/2011   Polysubstance    Alcohol, cocaine- resoloved for many years     Past Surgical  History:  Procedure Laterality Date   BI-VENTRICULAR IMPLANTABLE CARDIOVERTER DEFIBRILLATOR N/A 04/01/2011   Procedure: BI-VENTRICULAR IMPLANTABLE CARDIOVERTER DEFIBRILLATOR  (CRT-D);  Surgeon: Deboraha Sprang, MD;  Location: [facility] CATH LAB;  Service: Cardiovascular;  Laterality: N/A;   BIV ICD GENERATOR CHANGEOUT N/A 03/10/2017   Procedure: BIV ICD GENERATOR CHANGEOUT;  Surgeon: Deboraha Sprang, MD;  Location: [place] CV LAB;  Service: Cardiovascular;  Laterality: N/A;   CARDIAC DEFIBRILLATOR PLACEMENT  03/2011   CATARACT EXTRACTION     COLONOSCOPY  12/24/2011   Procedure: COLONOSCOPY;  Surgeon: [name], MD;  Location: [facility];  Service: Endoscopy;  Laterality: N/A;   COLONOSCOPY WITH PROPOFOL N/A 06/30/2017   Procedure: COLONOSCOPY WITH PROPOFOL;  Surgeon: [name], MD;  Location: [facility] ENDOS[facility];  Service: Endoscopy;  Laterality: N/A;   CORONARY ARTERY BYPASS GRAFT  2003   CYSTECTOMY     Upper Back   POLYPECTOMY  06/30/2017   Procedure: POLYPECTOMY;  Surgeon: [name], MD;  Location: [facility] ENDOS[facility];  Service: Endoscopy;;   TONSILLECTOMY      Social History   Socioeconomic History   Marital status: Married    Spouse name: Not on file   Number of children: 0  Years of education: Not on file   Highest education level: Not on file  Occupational History   Occupation: musician  Tobacco Use   Smoking status: Never   Smokeless tobacco: Never  Vaping Use   Vaping Use: Never used  Substance and Sexual Activity   Alcohol use: No    Alcohol/week: 0.0 standard drinks    Comment: Hx of abuse/clean since 2003    Drug use: No    Comment: Hx of abuse/clean for many years   Sexual activity: Not on file  Other Topics Concern   Not on file  Social History Narrative   Musician   Single, no children, 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     atorvastatin (LIPITOR) 80 MG tablet Take 80 mg by mouth at bedtime.   6   BAYER MICROLET LANCETS lancets USE TO CHECK BLOOD SUGAR 2 TIMES PER DAY. 100 each 12   carvedilol (COREG) 12.5 MG tablet TAKE 1 TABLET BY MOUTH TWICE DAILY WITH A MEAL, (Patient taking differently: Take 12.5 mg by mouth in the morning and at bedtime.) 180 tablet 3   Cholecalciferol (VITAMIN D-3) 125 MCG (5000 UT) TABS Take 5,000 Units by mouth daily.     digoxin (LANOXIN) 0.125 MG tablet Take 1 tablet (0.125 mg total) by mouth every other day. 45 tablet 3   ELIQUIS 5 MG TABS tablet TAKE 1 TABLET(5 MG) BY MOUTH TWICE DAILY 180 tablet 2   furosemide (LASIX) 20 MG tablet Take 20 mg by mouth daily.     glucose blood (CONTOUR NEXT TEST) test strip 1 each by Other route in the morning and at bedtime. And lancets 2/day 200 each 3   insulin detemir (LEVEMIR FLEXTOUCH) 100 UNIT/ML FlexPen Inject 6 Units into the skin at bedtime. 15 mL 11   Insulin Pen Needle (NOVOFINE) 32G X 6 MM MISC 1 each by Other route 3 (three) times daily. E11.9 100 each 2   levothyroxine (SYNTHROID, LEVOTHROID) 88 MCG tablet Take 1 tablet (88 mcg total) by mouth daily before breakfast. 30 tablet 0   sacubitril-valsartan (ENTRESTO) 24-26 MG Take 1 tablet by mouth 2 (two) times daily. 60 tablet 11   spironolactone (ALDACTONE) 25 MG tablet TAKE 1/2 TABLET BY MOUTH DAILY 45 tablet 3   No current facility-administered medications on file prior to visit.    No Known Allergies  Family History  Adopted: Yes  Family history unknown: Yes    BP  100/60 (BP Location: Right Arm, Patient Position: Sitting, Cuff Size: Normal)   Pulse 87   Ht 6' (1.829 m)   Wt 204 lb 3.2 oz (92.6 kg)   SpO2 97%   BMI 27.69 kg/m    Review of Systems     Objective:   Physical Exam Pulses: dorsalis pedis intact bilat.   MSK: no deformity of the feet CV: no leg  edema Skin:  no ulcer on the feet.  normal color and temp on the feet. Neuro: sensation is intact to touch on the feet  A1c=7.2%    Assessment & Plan:  Type 1 DM Hypoglycemia, due to insulin.   Patient Instructions  I have sent a prescription to your pharmacy, to change Humalog to 3 times a day (just before each meal) 16-16-35 units and:  Please continue the same Levemir.  check your blood sugar twice a day.  vary the time of day when you check, between before the 3 meals, and at bedtime.  also check if you have symptoms of your blood sugar being too high or too low.  please keep a record of the readings and bring it to your next appointment here (or you can bring the meter itself).  You can write it on any piece of paper.  please call us sooner if your blood sugar goes below 70, or if you have a lot of readings over 200.   Please come back for a follow-up appointment in 4 months.

## 2020-08-11 NOTE — Patient Instructions (Addendum)
I have sent a prescription to your pharmacy, to change Humalog to 3 times a day (just before each meal) 16-16-35 units and:  Please continue the same Levemir.  check your blood sugar twice a day.  vary the time of day when you check, between before the 3 meals, and at bedtime.  also check if you have symptoms of your blood sugar being too high or too low.  please keep a record of the readings and bring it to your next appointment here (or you can bring the meter itself).  You can write it on any piece of paper.  please call us sooner if your blood sugar goes below 70, or if you have a lot of readings over 200.   Please come back for a follow-up appointment in 4 months.

## 2020-08-11 NOTE — Telephone Encounter (Signed)
Please advise 

## 2020-08-18 ENCOUNTER — Other Ambulatory Visit: Payer: Self-pay | Admitting: Endocrinology

## 2020-08-18 DIAGNOSIS — N183 Chronic kidney disease, stage 3 unspecified: Secondary | ICD-10-CM

## 2020-08-18 DIAGNOSIS — Z794 Long term (current) use of insulin: Secondary | ICD-10-CM

## 2020-08-24 ENCOUNTER — Other Ambulatory Visit: Payer: Self-pay | Admitting: Internal Medicine

## 2020-08-24 ENCOUNTER — Telehealth: Payer: Self-pay | Admitting: Internal Medicine

## 2020-08-24 ENCOUNTER — Other Ambulatory Visit: Payer: Self-pay | Admitting: Urology

## 2020-08-24 NOTE — Telephone Encounter (Signed)
Adv pt that medication was sent and they confirmed receipt at 10:26 am.  He is going to double check with [facility].

## 2020-08-24 NOTE — Telephone Encounter (Signed)
   [facility] Pre-operative Risk Assessment    Patient Name: Caleb Bell  DOB: Nov 14, 1955 MRN: [id number]  HEARTCARE STAFF:  - IMPORTANT!!!!!! Under Visit Info/Reason for Call, type in Other and utilize the format Clearance MM/DD/YY or Clearance TBD. Do not use dashes or single digits. - Please review there is not already an duplicate clearance open for this procedure. - If request is for dental extraction, please clarify the # of teeth to be extracted. - If the patient is currently at the dentist's office, call Pre-Op Callback Staff (MA/nurse) to input urgent request.  - If the patient is not currently in the dentist office, please route to the Pre-Op pool.  Request for surgical clearance:  What type of surgery is being performed? Nephrectomy   When is this surgery scheduled? 09/27/20  What type of clearance is required (medical clearance vs. Pharmacy clearance to hold med vs. Both)? Both  Are there any medications that need to be held prior to surgery and how long? Eliquis 48 hours before   Practice name and name of physician performing surgery? Dr. Tresa Moore Urologist  What is the office phone number? [phone]   7.   What is the office fax number? [phone]  8.   Anesthesia type (None, local, MAC, general) ? General   Caleb Bell 08/24/2020, 11:21 AM  _________________________________________________________________   (provider comments below)

## 2020-08-24 NOTE — Telephone Encounter (Signed)
Pt is calling in regards to his medicine Carvedilol being denied. Please advise pt further

## 2020-08-25 NOTE — Telephone Encounter (Signed)
Dr. Caryl Comes  Looks like you last saw this patient 02/2020 at which time there had been an abrupt change in auto capture threshold which was programmed to subthreshold. Also issues with increased frequency of AF>>discussed ablation versus drug therapy and plan was for patient and wife to discuss further for more definitive plan.   He has now been scheduled for a nephrectomy on 09/27/20. From your perspective, can this patient proceed with surgery. We will have pharm make AC holding recommendations.   Please send your preference to the pre-op pool   Thank you

## 2020-08-28 NOTE — Telephone Encounter (Signed)
Patient with diagnosis of afib on Eliquis for anticoagulation.    Procedure: partial nephrectomy Date of procedure: 09/27/20  CHA2DS2-VASc Score = 3  This indicates a 3.2% annual risk of stroke. The patient's score is based upon: CHF History: Yes HTN History: No Diabetes History: Yes Stroke History: No Vascular Disease History: Yes Age Score: 0 Gender Score: 0   CrCl 73m/min Platelet count 187K  Per office protocol, patient can hold Eliquis for 2-3 days prior to procedure.

## 2020-08-28 NOTE — Telephone Encounter (Signed)
If functional status, ie CHF is stable,his surgical risks should be acceptable

## 2020-08-29 ENCOUNTER — Other Ambulatory Visit: Payer: Self-pay

## 2020-08-29 DIAGNOSIS — E1122 Type 2 diabetes mellitus with diabetic chronic kidney disease: Secondary | ICD-10-CM

## 2020-08-29 DIAGNOSIS — N1831 Chronic kidney disease, stage 3a: Secondary | ICD-10-CM

## 2020-08-29 MED ORDER — PEN NEEDLES 32G X 4 MM MISC: 5 refills | Status: AC

## 2020-08-30 NOTE — Telephone Encounter (Signed)
   Primary Cardiologist: Dorris Carnes, MD  Chart reviewed as part of pre-operative protocol coverage. Given past medical history and time since last visit, based on ACC/AHA guidelines, Caleb Bell would be at acceptable risk for the planned procedure without further cardiovascular testing.   Patient with diagnosis of afib on Eliquis for anticoagulation.     Procedure: partial nephrectomy Date of procedure: 09/27/20   CHA2DS2-VASc Score = 3  This indicates a 3.2% annual risk of stroke. The patient's score is based upon: CHF History: Yes HTN History: No Diabetes History: Yes Stroke History: No Vascular Disease History: Yes Age Score: 0 Gender Score: 0    CrCl 54m/min Platelet count 187K   Per office protocol, patient can hold Eliquis for 2-3 days prior to procedure.  His RCRI is a class IV risk, 11% risk of major cardiac event.  He is able to complete greater than 4 METS of physical activity.  Patient was advised that if he develops new symptoms prior to surgery to contact our office to arrange a follow-up appointment.  He verbalized understanding.  I will route this recommendation to the requesting party via Epic fax function and remove from pre-op pool.  Please call with questions.  JJossie Ng Tanekia Ryans NP-C    08/30/2020, 8:31 AM [email or web address] Office [phone] [phone]

## 2020-09-05 ENCOUNTER — Encounter: Payer: Self-pay | Admitting: Internal Medicine

## 2020-09-05 NOTE — Progress Notes (Signed)
[facility]  Patient Information: Name:  Caleb Bell  DOB:  10/24/1955  MRN:  [id number]    Johny Drilling, RN  P [facility] Device Planned Procedure:  Partial Lt nephrectomy  Surgeon:  Dr. Tresa Moore  Date of Procedure:  09/27/20  Cautery will be used.  Position during surgery:     Please send documentation back to:  [facility] (Fax # [phone])   Johny Drilling, RN  09/05/2020 4:00 PM  Device Information:  Clinic EP Physician:  Virl Axe, MD   Device Type:  Defibrillator Manufacturer and Phone #:  [employer]: [phone] Pacemaker Dependent?:  No. Date of Last Device Check:  06/07/20 Normal Device Function?:  Yes.    Electrophysiologist's Recommendations:  Have magnet available. Provide continuous ECG monitoring when magnet is used or reprogramming is to be performed.  Procedure may interfere with device function.  Magnet should be placed over device during procedure.  Per Device Clinic [address], [place], RN  4:23 PM 09/05/2020

## 2020-09-05 NOTE — Patient Instructions (Addendum)
DUE TO COVID-19 ONLY ONE VISITOR IS ALLOWED TO COME WITH YOU AND STAY IN THE WAITING ROOM ONLY DURING PRE OP AND PROCEDURE DAY OF SURGERY. THE 2 VISITORS  MAY VISIT WITH YOU AFTER SURGERY IN YOUR PRIVATE ROOM DURING VISITING HOURS ONLY!  YOU NEED TO HAVE A COVID 19 TEST ON_9/5______THIS TEST MUST BE DONE BEFORE SURGERY,                 [name]     Your procedure is scheduled on: 09/27/20   Report to [facility] Main  Entrance   Report to admitting at   6:30 AM     Call this number if you have problems the morning of surgery [phone]    Remember: Do not eat food after Midnight.  You may have clear liquids until 5:30 am   BRUSH YOUR TEETH MORNING OF SURGERY AND RINSE YOUR MOUTH OUT, NO CHEWING GUM CANDY OR MINTS.     Take these medicines the morning of surgery with A SIP OF WATER: Carvedilol, Entresto, Synthroid, Digoxin if it is on it's usual day  Stop taking _Eliquis__________on _9/3/22_________as instructed by __Dr. Mamie Nick. Ross___________.          How to Manage Your Diabetes Before and After Surgery  Why is it important to control my blood sugar before and after surgery? Improving blood sugar levels before and after surgery helps healing and can limit problems. A way of improving blood sugar control is eating a healthy diet by:  Eating less sugar and carbohydrates  Increasing activity/exercise  Talking with your doctor about reaching your blood sugar goals High blood sugars (greater than 180 mg/dL) can raise your risk of infections and slow your recovery, so you will need to focus on controlling your diabetes during the weeks before surgery. Make sure that the doctor who takes care of your diabetes knows about your planned surgery including the date and location.  How do I manage my blood sugar before surgery? Check your blood sugar at least 4 times a day, starting 2 days before surgery, to make sure that the level is not too high or low. Check your blood  sugar the morning of your surgery when you wake up and every 2 hours until you get to the Short Stay unit. If your blood sugar is less than 70 mg/dL, you will need to treat for low blood sugar: Do not take insulin. Treat a low blood sugar (less than 70 mg/dL) with  cup of clear juice (cranberry or apple), 4 glucose tablets, OR glucose gel. Recheck blood sugar in 15 minutes after treatment (to make sure it is greater than 70 mg/dL). If your blood sugar is not greater than 70 mg/dL on recheck, call [phone] for further instructions. Report your blood sugar to the short stay nurse when you get to Short Stay.  If you are admitted to the hospital after surgery: Your blood sugar will be checked by the staff and you will probably be given insulin after surgery (instead of oral diabetes medicines) to make sure you have good blood sugar levels. The goal for blood sugar control after surgery is 80-180 mg/dL.   WHAT DO I DO ABOUT MY DIABETES MEDICATION?  Do not take oral diabetes medicines (pills) the morning of surgery.  THE NIGHT BEFORE SURGERY, take  3   units of   Levemir  insulin.       THE MORNING OF SURGERY, take 0  units of  insulin.  If your CBG is greater than 220 mg/dL, you may take  of your sliding scale  (correction) dose of insulin.                             You may not have any metal on your body including              piercings  Do not wear jewelry, lotions, powders or deodorant                          Men may shave face and neck.   Do not bring valuables to the hospital. [place].  Contacts, dentures or bridgework may not be worn into surgery.                    Please read over the following fact sheets you were given: _____________________________________________________________________             [facility] - Preparing for Surgery Before surgery, you can play an important role.  Because skin is not  sterile, your skin needs to be as free of germs as possible.  You can reduce the number of germs on your skin by washing with CHG (chlorahexidine gluconate) soap before surgery.  CHG is an antiseptic cleaner which kills germs and bonds with the skin to continue killing germs even after washing. Please DO NOT use if you have an allergy to CHG or antibacterial soaps.  If your skin becomes reddened/irritated stop using the CHG and inform your nurse when you arrive at Short Stay.   You may shave your face/neck. Please follow these instructions carefully:  1.  Shower with CHG Soap the night before surgery and the  morning of Surgery.  2.  If you choose to wash your hair, wash your hair first as usual with your  normal  shampoo.  3.  After you shampoo, rinse your hair and body thoroughly to remove the  shampoo.                                        4.  Use CHG as you would any other liquid soap.  You can apply chg directly  to the skin and wash                       Gently with a scrungie or clean washcloth.  5.  Apply the CHG Soap to your body ONLY FROM THE NECK DOWN.   Do not use on face/ open                           Wound or open sores. Avoid contact with eyes, ears mouth and genitals (private parts).                       Wash face,  Genitals (private parts) with your normal soap.             6.  Wash thoroughly, paying special attention to the area where your surgery  will be performed.  7.  Thoroughly rinse your body with warm water from the neck down.  8.  DO NOT shower/wash with your normal soap after using and rinsing off  the CHG Soap.             9.  Pat yourself dry with a clean towel.            10.  Wear clean pajamas.            11.  Place clean sheets on your bed the night of your first shower and do not  sleep with pets. Day of Surgery : Do not apply any lotions/deodorants the morning of surgery.  Please wear clean clothes to the hospital/surgery center.  FAILURE TO FOLLOW THESE  INSTRUCTIONS MAY RESULT IN THE CANCELLATION OF YOUR SURGERY PATIENT SIGNATURE_________________________________  NURSE SIGNATURE__________________________________  ________________________________________________________________________

## 2020-09-06 ENCOUNTER — Ambulatory Visit (INDEPENDENT_AMBULATORY_CARE_PROVIDER_SITE_OTHER): Payer: Managed Care, Other (non HMO)

## 2020-09-06 DIAGNOSIS — I255 Ischemic cardiomyopathy: Secondary | ICD-10-CM | POA: Diagnosis not present

## 2020-09-07 ENCOUNTER — Other Ambulatory Visit: Payer: Self-pay

## 2020-09-07 ENCOUNTER — Encounter (HOSPITAL_COMMUNITY)
Admission: RE | Admit: 2020-09-07 | Discharge: 2020-09-07 | Disposition: A | Discharge status: Home / Self Care | Payer: Managed Care, Other (non HMO) | Source: Ambulatory Visit | Attending: Urology | Admitting: Urology

## 2020-09-07 ENCOUNTER — Encounter (HOSPITAL_COMMUNITY): Payer: Self-pay

## 2020-09-07 DIAGNOSIS — Z01812 Encounter for preprocedural laboratory examination: Secondary | ICD-10-CM | POA: Insufficient documentation

## 2020-09-07 LAB — BASIC METABOLIC PANEL
Anion gap: 9 (ref 5–15)
BUN: 32 mg/dL — ABNORMAL HIGH (ref 8–23)
CO2: 28 mmol/L (ref 22–32)
Calcium: 9.6 mg/dL (ref 8.9–10.3)
Chloride: 100 mmol/L (ref 98–111)
Creatinine, Ser: 1.63 mg/dL — ABNORMAL HIGH (ref 0.61–1.24)
GFR, Estimated: 47 mL/min — ABNORMAL LOW (ref >60)
Glucose, Bld: 168 mg/dL — ABNORMAL HIGH (ref 70–99)
Potassium: 4.8 mmol/L (ref 3.5–5.1)
Sodium: 137 mmol/L (ref 135–145)

## 2020-09-07 LAB — CBC
HCT: 38.2 % — ABNORMAL LOW (ref 39.0–52.0)
Hemoglobin: 12.6 g/dL — ABNORMAL LOW (ref 13.0–17.0)
MCH: 28.3 pg (ref 26.0–34.0)
MCHC: 33 g/dL (ref 30.0–36.0)
MCV: 85.7 fL (ref 80.0–100.0)
Platelets: 206 10*3/uL (ref 150–400)
RBC: 4.46 MIL/uL (ref 4.22–5.81)
RDW: 13.9 % (ref 11.5–15.5)
WBC: 8.6 10*3/uL (ref 4.0–10.5)
nRBC: 0 % (ref 0.0–0.2)

## 2020-09-07 LAB — GLUCOSE, CAPILLARY: Glucose-Capillary: 162 mg/dL — ABNORMAL HIGH (ref 70–99)

## 2020-09-07 NOTE — Progress Notes (Signed)
COVID Vaccine Completed:Yes Date COVID test 09/25/20  PCP - Dr. Electa Sniff  Endocrinologist-Dr. Cordelia Pen [place] 08/11/20 Cardiologist - Dr. Lizbeth Bark 08/24/20-epic  Chest x-ray - no EKG - 02/04/20-epic Stress Test - no ECHO - 02/26/17-epic Cardiac Cath - CABG 2003 Pacemaker/ICD device last checked:06/08/20  Sleep Study - no CPAP -   Fasting Blood Sugar - 112-130 Checks Blood Sugar _____ times a day.2-3 times a week  Blood Thinner Instructions:Eliquis /Dr. Harrington Challenger Aspirin Instructions:hold 3 days prior to DOS/ Dr. Harrington Challenger Last Dose:09/23/20  Anesthesia review: yes  Patient denies shortness of breath, fever, cough and chest pain at PAT appointment Pt is very active . No SOB with any activities.    Patient verbalized understanding of instructions that were given to them at the PAT appointment. Patient was also instructed that they will need to review over the PAT instructions again at home before surgery. yes

## 2020-09-08 ENCOUNTER — Telehealth: Payer: Self-pay

## 2020-09-08 LAB — CUP PACEART REMOTE DEVICE CHECK
Battery Remaining Longevity: 43 mo
Battery Remaining Percentage: 48 %
Battery Voltage: 2.93 V
Brady Statistic AP VP Percent: 85 %
Brady Statistic AP VS Percent: 1.4 %
Brady Statistic AS VP Percent: 10 %
Brady Statistic AS VS Percent: 1 %
Brady Statistic RA Percent Paced: 72 %
Date Time Interrogation Session: 20220817055623
HighPow Impedance: 65 Ohm
HighPow Impedance: 65 Ohm
Implantable Lead Implant Date: 20130311
Implantable Lead Implant Date: 20130311
Implantable Lead Implant Date: 20130311
Implantable Lead Location: 753858
Implantable Lead Location: 753859
Implantable Lead Location: 753860
Implantable Lead Model: 7122
Implantable Pulse Generator Implant Date: 20190218
Lead Channel Impedance Value: 330 Ohm
Lead Channel Impedance Value: 350 Ohm
Lead Channel Impedance Value: 780 Ohm
Lead Channel Pacing Threshold Amplitude: 1 V
Lead Channel Pacing Threshold Amplitude: 1.125 V
Lead Channel Pacing Threshold Amplitude: 3.25 V
Lead Channel Pacing Threshold Pulse Width: 0.5 ms
Lead Channel Pacing Threshold Pulse Width: 0.5 ms
Lead Channel Pacing Threshold Pulse Width: 1.5 ms
Lead Channel Sensing Intrinsic Amplitude: 11.9 mV
Lead Channel Sensing Intrinsic Amplitude: 5 mV
Lead Channel Setting Pacing Amplitude: 0.25 V
Lead Channel Setting Pacing Amplitude: 2 V
Lead Channel Setting Pacing Amplitude: 2.125
Lead Channel Setting Pacing Pulse Width: 0.05 ms
Lead Channel Setting Pacing Pulse Width: 0.5 ms
Lead Channel Setting Sensing Sensitivity: 0.5 mV
Pulse Gen Serial Number: 9780477

## 2020-09-08 NOTE — Telephone Encounter (Signed)
Patient called in stating his monitor is not working, we have tried to troubleshoot and was unsuccessful. Patient was given tech support number as he couldn't call at the moment and he will call first thing Monday morning.

## 2020-09-08 NOTE — Progress Notes (Signed)
Anesthesia Chart Review:   Case: [id number] Date/Time: 09/27/20 0815   Procedure: XI ROBOTIC ASSITED PARTIAL NEPHRECTOMY (Left) - 3 HRS   Anesthesia type: General   Pre-op diagnosis: ENLARGING LEFT RENAL MASS   Location: [facility] ROOM 03 / [facility]   Surgeons: Alexis Frock, MD       DISCUSSION: Pt is 64 years old with hx CAD (s/p CABG 123456), chronic systolic HF (EF 99991111 on 2019 echo), ischemic cardiomyopathy with IVCD, CRT-D ([employer] BiV ICD implanted 2013, generator change 2019), mild aortic stenosis (on 2019 echo), PAF, DM, chronic renal insufficiency, Hodgkin's lymphoma (s/p XRT)  Pt to hold eliquis 2-3 days before surgery  Perioperative prescription for AICD:  Have magnet available. Provide continuous ECG monitoring when magnet is used or reprogramming is to be performed.  Procedure may interfere with device function.  Magnet should be placed over device during procedure  VS: BP (!) 106/57   Pulse 100   Temp 36.9 C (Oral)   Resp 18   Ht '6\' 1"'$  (1.854 m)   Wt 93 kg   SpO2 100%   BMI 27.05 kg/m   PROVIDERS: - PCP is Lavone Orn, MD - Nephrologist is Elmarie Shiley, MD - Cardiologist is Dorris Carnes, MD. Last office visit 10/11/19. Cleared for surgery at acceptable risk on 08/30/20 by Coletta Memos, NP (see also telephone note by Virl Axe, MD 08/28/20) - EP cardiologist is Virl Axe, MD. Last telehealth visit 03/14/20 - Endocrinologist is Renato Shin, MD   LABS: Labs reviewed: Acceptable for surgery. - Cr 1.63. this is consistent with prior results. Baseline Cr ~1.5-1.6 - HbA1c was 7.2 on 08/11/20  (all labs ordered are listed, but only abnormal results are displayed)  Labs Reviewed  BASIC METABOLIC PANEL - Abnormal; Notable for the following components:      Result Value   Glucose, Bld 168 (*)    BUN 32 (*)    Creatinine, Ser 1.63 (*)    GFR, Estimated 47 (*)    All other components within normal limits  CBC - Abnormal; Notable for the following components:    Hemoglobin 12.6 (*)    HCT 38.2 (*)    All other components within normal limits  GLUCOSE, CAPILLARY - Abnormal; Notable for the following components:   Glucose-Capillary 162 (*)    All other components within normal limits     EKG 02/04/20: AV dual-paced rhythm    CV: Cardiac monitor 12/24/19:  - HR  avg 79  Min 59-Max 126  - PVCs 2% - PACs  - SVT Nonsustained 4 episodes; fastest 129 bpm for 6 beats; longest for 18 beats at 111 bpm  - occ vent couplets  Carotid duplex 08/22/17:  - Right Carotid: Velocities in the right ICA are consistent with a 1-39% stenosis.  - Left Carotid: Velocities in the left ICA are consistent with a 1-39% stenosis.  - Vertebrals:  Bilateral vertebral arteries demonstrate antegrade flow.  - Subclavians: Normal flow hemodynamics were seen in bilateral subclavian arteries.  Echo 02/26/17:  - Left ventricle: The cavity size was mildly dilated. Wall thickness was increased in a pattern of mild LVH. Inferior hypokinesis, inferolateral akinesis, anterolateral hypokinesis, anterior hypokinesis. Systolic function was moderately to severely reduced. The estimated ejection fraction was in the  range of 30% to 35%. Doppler parameters are consistent with abnormal left ventricular relaxation (grade 1 diastolic dysfunction).  - Aortic valve: Trileaflet; moderately calcified leaflets. There was mild stenosis. Mean gradient (S): 15 mm Hg. Valve area (  VTI): 1.58 cm^2.  - Mitral valve: There was trivial regurgitation.  - Left atrium: The atrium was moderately dilated.  - Right ventricle: The cavity size was normal. Pacer wire or catheter noted in right ventricle. Systolic function was normal.  - Right atrium: The atrium was mildly dilated.  - Tricuspid valve: Peak RV-RA gradient (S): 30 mm Hg.  - Pulmonary arteries: PA peak pressure: 33 mm Hg (S).  - Inferior vena cava: The vessel was normal in size. The respirophasic diameter changes were in the normal range (>= 50%),  consistent with normal central venous pressure.  - Impressions:  Mildly dilated LV with mild LV hypertrophy. EF 30-35%, wall motion abnormalities as noted above. Normal RV size and systolic function. Mild aortic stenosis.    Past Medical History:  Diagnosis Date   Atrial fibrillation ([place])    Paroxysmal, limited   CAD (coronary artery disease)    CABG 123456   Chronic systolic heart failure (HCC)    EF 20-25% echo 5- 2011   CRI (chronic renal insufficiency)     multifactorial... Dr. Posey Pronto.. consult September 18, 2009   DM type 2 (diabetes mellitus, type 2) ([place])    Dr Loanne Drilling   Ejection fraction < 50%    EF 25%, echo, November, 2012  //   planning followup 2-D echo to assess LV function with CRT D.  device in place   Elevated serum creatinine    Epididymal cyst 2017   right, no testis masses   Hodgkin's disease     Dx aprox 2004, s/p XRT-Chemo/had radiation too   Hyperkalemia    August, 2011, Aldactone, Aldactone stopped   Hyperlipidemia    low HDL   Hypothyroidism    ICD (implantable cardiac defibrillator) in place    CRT-D placed March, 2013   Ischemic cardiomyopathy    MRI12/12 no real viability in hypo/akinetic segment  CAth native and graft disease   IVCD (intraventricular conduction defect)    Myocardial infarction ([place])    mild MI/ several MI per pt in the past.   Personal history of colonic adenomas 12/24/2011   Polysubstance    Alcohol, cocaine- resoloved for many years     Past Surgical History:  Procedure Laterality Date   BI-VENTRICULAR IMPLANTABLE CARDIOVERTER DEFIBRILLATOR N/A 04/01/2011   Procedure: BI-VENTRICULAR IMPLANTABLE CARDIOVERTER DEFIBRILLATOR  (CRT-D);  Surgeon: Deboraha Sprang, MD;  Location: [facility] CATH LAB;  Service: Cardiovascular;  Laterality: N/A;   BIV ICD GENERATOR CHANGEOUT N/A 03/10/2017   Procedure: BIV ICD GENERATOR CHANGEOUT;  Surgeon: Deboraha Sprang, MD;  Location: [facility] CV LAB;  Service: Cardiovascular;  Laterality: N/A;   CARDIAC  DEFIBRILLATOR PLACEMENT  03/2011   CATARACT EXTRACTION     COLONOSCOPY  12/24/2011   Procedure: COLONOSCOPY;  Surgeon: [name], MD;  Location: [facility] ENDOSCOPY;  Service: Endoscopy;  Laterality: N/A;   COLONOSCOPY WITH PROPOFOL N/A 06/30/2017   Procedure: COLONOSCOPY WITH PROPOFOL;  Surgeon: [name], MD;  Location: [facility] ENDOSCOPY;  Service: Endoscopy;  Laterality: N/A;   CORONARY ARTERY BYPASS GRAFT  2003   CYSTECTOMY     Upper Back   POLYPECTOMY  06/30/2017   Procedure: POLYPECTOMY;  Surgeon: [name], MD;  Location: [facility] ENDOSCOPY;  Service: Endoscopy;;   TONSILLECTOMY     age 63    MEDICATIONS:  acetaminophen (TYLENOL) 500 MG tablet   atorvastatin (LIPITOR) 80 MG tablet   BAYER MICROLET LANCETS lancets   carvedilol (COREG) 12.5 MG tablet   Cholecalciferol (  VITAMIN D-3) 125 MCG (5000 UT) TABS   digoxin (LANOXIN) 0.125 MG tablet   ELIQUIS 5 MG TABS tablet   furosemide (LASIX) 20 MG tablet   glucose blood (CONTOUR NEXT TEST) test strip   insulin detemir (LEVEMIR FLEXTOUCH) 100 UNIT/ML FlexPen   insulin lispro (HUMALOG KWIKPEN) 100 UNIT/ML KwikPen   Insulin Pen Needle (PEN NEEDLES) 32G X 4 MM MISC   levothyroxine (SYNTHROID, LEVOTHROID) 88 MCG tablet   sacubitril-valsartan (ENTRESTO) 24-26 MG   spironolactone (ALDACTONE) 25 MG tablet   No current facility-administered medications for this encounter.   - Pt to hold eliquis 2-3 days before surgery  If no changes, I anticipate pt can proceed with surgery as scheduled.   Willeen Cass, PhD, FNP-BC [facility]/Anesthesiology Phone: [phone] 09/08/2020 4:36 PM

## 2020-09-08 NOTE — Anesthesia Preprocedure Evaluation (Addendum)
Anesthesia Evaluation  Patient identified by MRN, date of birth, ID band Patient awake    Reviewed: Allergy & Precautions, NPO status , Patient's Chart, lab work & pertinent test results, reviewed documented beta blocker date and time   History of Anesthesia Complications Negative for: history of anesthetic complications  Airway Mallampati: II  TM Distance: >3 FB Neck ROM: Full    Dental  (+) Dental Advisory Given, Edentulous Lower, Edentulous Upper   Pulmonary neg pulmonary ROS,    Pulmonary exam normal        Cardiovascular + CAD, + Past MI, + CABG and +CHF  Normal cardiovascular exam+ dysrhythmias Atrial Fibrillation + Cardiac Defibrillator    '19 TTE - LV cavity size was mildly dilated. Mild LVH. Inferior hypokinesis, inferolateral akinesis, anterolateral hypokinesis, anterior hypokinesis. EF 30% to 35%. Grade 1 diastolic dysfunction. Mild AS. Mean gradient (S): 15 mm Hg. Valve area (VTI):1.58 cm^2. Trivial TR. Moderately dilated LA. RA was mildly dilated.   ICD Instructions per PAT note: Procedure may interfere with device function.  Magnet should be placed over device during procedure     Neuro/Psych negative neurological ROS  negative psych ROS   GI/Hepatic negative GI ROS, (+)     substance abuse  alcohol use and cocaine use,   Endo/Other  diabetes, Insulin DependentHypothyroidism   Renal/GU CRFRenal disease     Musculoskeletal negative musculoskeletal ROS (+)   Abdominal   Peds  Hematology  On eliquis    Anesthesia Other Findings   Reproductive/Obstetrics                          Anesthesia Physical Anesthesia Plan  ASA: 4  Anesthesia Plan: General   Post-op Pain Management:    Induction: Intravenous  PONV Risk Score and Plan: 4 or greater and Treatment may vary due to age or medical condition, Ondansetron, Dexamethasone, Midazolam and Scopolamine patch -  Pre-op  Airway Management Planned: Oral ETT  Additional Equipment: Arterial line  Intra-op Plan:   Post-operative Plan: Extubation in OR  Informed Consent: I have reviewed the patients History and Physical, chart, labs and discussed the procedure including the risks, benefits and alternatives for the proposed anesthesia with the patient or authorized representative who has indicated his/her understanding and acceptance.     Dental advisory given  Plan Discussed with: CRNA and Anesthesiologist  Anesthesia Plan Comments:       Anesthesia Quick Evaluation

## 2020-09-19 ENCOUNTER — Telehealth: Payer: Self-pay | Admitting: Internal Medicine

## 2020-09-19 NOTE — Telephone Encounter (Signed)
   [facility] Pre-operative Risk Assessment    Patient Name: Caleb Bell  DOB: 01-26-1955 MRN: [id number]  HEARTCARE STAFF:  - IMPORTANT!!!!!! Under Visit Info/Reason for Call, type in Other and utilize the format Clearance MM/DD/YY or Clearance TBD. Do not use dashes or single digits. - Please review there is not already an duplicate clearance open for this procedure. - If request is for dental extraction, please clarify the # of teeth to be extracted. - If the patient is currently at the dentist's office, call Pre-Op Callback Staff (MA/nurse) to input urgent request.  - If the patient is not currently in the dentist office, please route to the Pre-Op pool.  Request for surgical clearance:  What type of surgery is being performed? Partial Nephrectomy  When is this surgery scheduled? 09/27/2020  What type of clearance is required (medical clearance vs. Pharmacy clearance to hold med vs. Both)? Medical  Are there any medications that need to be held prior to surgery and how long? Already ok'd by Dr. Harrington Challenger  Practice name and name of physician performing surgery? [facility]; Dr. Tresa Moore  What is the office phone number? [phone]   7.   What is the office fax number? [phone]  8.   Anesthesia type (None, local, MAC, general) ? General    Phillip Pough 09/19/2020, 11:51 AM  _________________________________________________________________   (provider comments below)

## 2020-09-19 NOTE — Telephone Encounter (Signed)
    Patient Name: Caleb Bell  DOB: 02-Dec-1955 MRN: [id number]  Primary Cardiologist: Dorris Carnes, MD  Chart reviewed as part of pre-operative protocol coverage. Patient was contacted by my colleague Coletta Memos, NP, on 08/30/2020 and reported doing well with no cardiac complaints.  Pre-op risk assessment for partial nephrectomy was addressed at that time. I will copy Jesse's note below:  "Given past medical history and time since last visit, based on ACC/AHA guidelines, Caleb Bell would be at acceptable risk for the planned procedure without further cardiovascular testing.    Patient with diagnosis of afib on Eliquis for anticoagulation.     Procedure: partial nephrectomy Date of procedure: 09/27/20   CHA2DS2-VASc Score = 3  This indicates a 3.2% annual risk of stroke. The patient's score is based upon: CHF History: Yes HTN History: No Diabetes History: Yes Stroke History: No Vascular Disease History: Yes Age Score: 0 Gender Score: 0    CrCl 81m/min Platelet count 187K   Per office protocol, patient can hold Eliquis for 2-3 days prior to procedure.   His RCRI is a class IV risk, 11% risk of major cardiac event.  He is able to complete greater than 4 METS of physical activity.   Patient was advised that if he develops new symptoms prior to surgery to contact our office to arrange a follow-up appointment.  He verbalized understanding."   I will route this recommendation to the requesting party via [place] and remove from pre-op pool.   Please call with questions.  CDarreld Mclean PA-C 09/19/2020 12:12 PM

## 2020-09-26 ENCOUNTER — Other Ambulatory Visit: Payer: Self-pay | Admitting: Urology

## 2020-09-26 LAB — SARS CORONAVIRUS 2 (TAT 6-24 HRS): SARS Coronavirus 2: NEGATIVE

## 2020-09-26 NOTE — Progress Notes (Signed)
Remote ICD transmission.   

## 2020-09-27 ENCOUNTER — Ambulatory Visit (HOSPITAL_COMMUNITY): Payer: Managed Care, Other (non HMO) | Admitting: Anesthesiology

## 2020-09-27 ENCOUNTER — Observation Stay (HOSPITAL_COMMUNITY)
Admission: RE | Admit: 2020-09-27 | Discharge: 2020-09-28 | Disposition: A | Discharge status: Home / Self Care | Payer: Managed Care, Other (non HMO) | Source: Ambulatory Visit | Attending: Urology | Admitting: Urology

## 2020-09-27 ENCOUNTER — Encounter (HOSPITAL_COMMUNITY)
Admission: RE | Disposition: A | Discharge status: Home / Self Care | Payer: Self-pay | Source: Ambulatory Visit | Attending: Urology

## 2020-09-27 ENCOUNTER — Ambulatory Visit (HOSPITAL_COMMUNITY): Payer: Managed Care, Other (non HMO) | Admitting: Emergency Medicine

## 2020-09-27 ENCOUNTER — Encounter (HOSPITAL_COMMUNITY): Payer: Self-pay | Admitting: Urology

## 2020-09-27 ENCOUNTER — Other Ambulatory Visit: Payer: Self-pay

## 2020-09-27 DIAGNOSIS — N281 Cyst of kidney, acquired: Secondary | ICD-10-CM | POA: Diagnosis not present

## 2020-09-27 DIAGNOSIS — Z7901 Long term (current) use of anticoagulants: Secondary | ICD-10-CM | POA: Diagnosis not present

## 2020-09-27 DIAGNOSIS — I480 Paroxysmal atrial fibrillation: Secondary | ICD-10-CM | POA: Diagnosis not present

## 2020-09-27 DIAGNOSIS — N2889 Other specified disorders of kidney and ureter: Secondary | ICD-10-CM | POA: Diagnosis present

## 2020-09-27 DIAGNOSIS — Z951 Presence of aortocoronary bypass graft: Secondary | ICD-10-CM | POA: Insufficient documentation

## 2020-09-27 DIAGNOSIS — C642 Malignant neoplasm of left kidney, except renal pelvis: Principal | ICD-10-CM | POA: Insufficient documentation

## 2020-09-27 DIAGNOSIS — E039 Hypothyroidism, unspecified: Secondary | ICD-10-CM | POA: Insufficient documentation

## 2020-09-27 DIAGNOSIS — E119 Type 2 diabetes mellitus without complications: Secondary | ICD-10-CM | POA: Insufficient documentation

## 2020-09-27 DIAGNOSIS — Z950 Presence of cardiac pacemaker: Secondary | ICD-10-CM | POA: Insufficient documentation

## 2020-09-27 DIAGNOSIS — I2510 Atherosclerotic heart disease of native coronary artery without angina pectoris: Secondary | ICD-10-CM | POA: Diagnosis not present

## 2020-09-27 DIAGNOSIS — I5022 Chronic systolic (congestive) heart failure: Secondary | ICD-10-CM | POA: Insufficient documentation

## 2020-09-27 DIAGNOSIS — Z794 Long term (current) use of insulin: Secondary | ICD-10-CM | POA: Insufficient documentation

## 2020-09-27 DIAGNOSIS — Z79899 Other long term (current) drug therapy: Secondary | ICD-10-CM | POA: Diagnosis not present

## 2020-09-27 HISTORY — DX: Presence of cardiac pacemaker: Z95.0

## 2020-09-27 HISTORY — PX: ROBOTIC ASSITED PARTIAL NEPHRECTOMY: SHX6087

## 2020-09-27 HISTORY — DX: Presence of automatic (implantable) cardiac defibrillator: Z95.810

## 2020-09-27 LAB — GLUCOSE, CAPILLARY
Glucose-Capillary: 167 mg/dL — ABNORMAL HIGH (ref 70–99)
Glucose-Capillary: 227 mg/dL — ABNORMAL HIGH (ref 70–99)
Glucose-Capillary: 233 mg/dL — ABNORMAL HIGH (ref 70–99)
Glucose-Capillary: 191 mg/dL — ABNORMAL HIGH (ref 70–99)
Glucose-Capillary: 362 mg/dL — ABNORMAL HIGH (ref 70–99)

## 2020-09-27 LAB — TYPE AND SCREEN
ABO/RH(D): B POS
Antibody Screen: NEGATIVE

## 2020-09-27 LAB — HEMOGLOBIN AND HEMATOCRIT, BLOOD
HCT: 38.1 % — ABNORMAL LOW (ref 39.0–52.0)
Hemoglobin: 12.9 g/dL — ABNORMAL LOW (ref 13.0–17.0)

## 2020-09-27 LAB — ABO/RH: ABO/RH(D): B POS

## 2020-09-27 SURGERY — NEPHRECTOMY, PARTIAL, ROBOT-ASSISTED
Anesthesia: General | Laterality: Left

## 2020-09-27 MED ORDER — DEXAMETHASONE SODIUM PHOSPHATE 4 MG/ML IJ SOLN
INTRAMUSCULAR | Status: DC | PRN
  Administered 2020-09-27: 5 mg via INTRAVENOUS

## 2020-09-27 MED ORDER — EPHEDRINE 5 MG/ML INJ
INTRAVENOUS | Status: AC
  Filled 2020-09-27: qty 5

## 2020-09-27 MED ORDER — EPHEDRINE SULFATE-NACL 50-0.9 MG/10ML-% IV SOSY
PREFILLED_SYRINGE | INTRAVENOUS | Status: DC | PRN
  Administered 2020-09-27: 2.5 mg via INTRAVENOUS
  Administered 2020-09-27 (×2): 10 mg via INTRAVENOUS
  Administered 2020-09-27: 2.5 mg via INTRAVENOUS
  Administered 2020-09-27: 5 mg via INTRAVENOUS

## 2020-09-27 MED ORDER — FENTANYL CITRATE PF 50 MCG/ML IJ SOSY
25.0000 ug | PREFILLED_SYRINGE | INTRAMUSCULAR | Status: DC | PRN
  Administered 2020-09-27: 50 ug via INTRAVENOUS
  Administered 2020-09-27: 25 ug via INTRAVENOUS

## 2020-09-27 MED ORDER — LACTATED RINGERS IV SOLN: INTRAVENOUS | Status: DC

## 2020-09-27 MED ORDER — FENTANYL CITRATE (PF) 100 MCG/2ML IJ SOLN
INTRAMUSCULAR | Status: DC | PRN
  Administered 2020-09-27 (×5): 50 ug via INTRAVENOUS

## 2020-09-27 MED ORDER — MIDAZOLAM HCL 2 MG/2ML IJ SOLN
INTRAMUSCULAR | Status: AC
  Filled 2020-09-27: qty 2

## 2020-09-27 MED ORDER — ORAL CARE MOUTH RINSE: 15.0000 mL | Freq: Once | OROMUCOSAL | Status: AC

## 2020-09-27 MED ORDER — LIDOCAINE 2% (20 MG/ML) 5 ML SYRINGE
INTRAMUSCULAR | Status: AC
  Filled 2020-09-27: qty 5

## 2020-09-27 MED ORDER — OXYCODONE HCL 5 MG PO TABS: 5.0000 mg | ORAL_TABLET | Freq: Once | ORAL | Status: DC | PRN

## 2020-09-27 MED ORDER — FENTANYL CITRATE (PF) 250 MCG/5ML IJ SOLN
INTRAMUSCULAR | Status: AC
  Filled 2020-09-27: qty 5

## 2020-09-27 MED ORDER — MORPHINE SULFATE (PF) 2 MG/ML IV SOLN
2.0000 mg | INTRAVENOUS | Status: DC | PRN
  Administered 2020-09-27: 2 mg via INTRAVENOUS
  Filled 2020-09-27: qty 1

## 2020-09-27 MED ORDER — PHENYLEPHRINE HCL-NACL 20-0.9 MG/250ML-% IV SOLN
INTRAVENOUS | Status: AC
  Filled 2020-09-27: qty 250

## 2020-09-27 MED ORDER — LEVOTHYROXINE SODIUM 88 MCG PO TABS
88.0000 ug | ORAL_TABLET | Freq: Every day | ORAL | Status: DC
  Administered 2020-09-28: 88 ug via ORAL
  Filled 2020-09-27: qty 1

## 2020-09-27 MED ORDER — INSULIN ASPART 100 UNIT/ML IJ SOLN
INTRAMUSCULAR | Status: AC
  Administered 2020-09-27: 5 [IU] via SUBCUTANEOUS
  Filled 2020-09-27: qty 1

## 2020-09-27 MED ORDER — INSULIN ASPART 100 UNIT/ML IJ SOLN
0.0000 [IU] | Freq: Every day | INTRAMUSCULAR | Status: DC
  Administered 2020-09-27: 4 [IU] via SUBCUTANEOUS

## 2020-09-27 MED ORDER — VASOPRESSIN 20 UNIT/ML IV SOLN
INTRAVENOUS | Status: AC
  Filled 2020-09-27: qty 1

## 2020-09-27 MED ORDER — SENNOSIDES-DOCUSATE SODIUM 8.6-50 MG PO TABS: 1.0000 | ORAL_TABLET | Freq: Two times a day (BID) | ORAL | 0 refills | Status: DC

## 2020-09-27 MED ORDER — INSULIN ASPART 100 UNIT/ML IJ SOLN
0.0000 [IU] | Freq: Three times a day (TID) | INTRAMUSCULAR | Status: DC
  Administered 2020-09-28: 15 [IU] via SUBCUTANEOUS

## 2020-09-27 MED ORDER — ONDANSETRON HCL 4 MG/2ML IJ SOLN
INTRAMUSCULAR | Status: DC | PRN
  Administered 2020-09-27: 4 mg via INTRAVENOUS

## 2020-09-27 MED ORDER — CHLORHEXIDINE GLUCONATE CLOTH 2 % EX PADS
6.0000 | MEDICATED_PAD | Freq: Every day | CUTANEOUS | Status: DC
  Administered 2020-09-27 – 2020-09-28 (×2): 6 via TOPICAL

## 2020-09-27 MED ORDER — ACETAMINOPHEN 500 MG PO TABS
1000.0000 mg | ORAL_TABLET | Freq: Four times a day (QID) | ORAL | Status: AC
  Administered 2020-09-27 – 2020-09-28 (×4): 1000 mg via ORAL
  Filled 2020-09-27 (×4): qty 2

## 2020-09-27 MED ORDER — FENTANYL CITRATE PF 50 MCG/ML IJ SOSY
PREFILLED_SYRINGE | INTRAMUSCULAR | Status: AC
  Filled 2020-09-27: qty 2

## 2020-09-27 MED ORDER — CARVEDILOL 12.5 MG PO TABS
12.5000 mg | ORAL_TABLET | Freq: Two times a day (BID) | ORAL | Status: DC
  Administered 2020-09-27 – 2020-09-28 (×2): 12.5 mg via ORAL
  Filled 2020-09-27 (×2): qty 1

## 2020-09-27 MED ORDER — LIDOCAINE 2% (20 MG/ML) 5 ML SYRINGE
INTRAMUSCULAR | Status: DC | PRN
  Administered 2020-09-27: 60 mg via INTRAVENOUS

## 2020-09-27 MED ORDER — ONDANSETRON HCL 4 MG/2ML IJ SOLN
INTRAMUSCULAR | Status: AC
  Filled 2020-09-27: qty 2

## 2020-09-27 MED ORDER — OXYCODONE HCL 5 MG PO TABS
5.0000 mg | ORAL_TABLET | ORAL | Status: DC | PRN
  Administered 2020-09-28: 5 mg via ORAL
  Filled 2020-09-27: qty 2
  Filled 2020-09-27: qty 1

## 2020-09-27 MED ORDER — INSULIN ASPART 100 UNIT/ML IJ SOLN
INTRAMUSCULAR | Status: AC
  Filled 2020-09-27: qty 1

## 2020-09-27 MED ORDER — SPIRONOLACTONE 12.5 MG HALF TABLET
12.5000 mg | ORAL_TABLET | Freq: Every day | ORAL | Status: DC
  Administered 2020-09-28: 12.5 mg via ORAL
  Filled 2020-09-27: qty 1

## 2020-09-27 MED ORDER — ROCURONIUM BROMIDE 10 MG/ML (PF) SYRINGE
PREFILLED_SYRINGE | INTRAVENOUS | Status: AC
  Filled 2020-09-27: qty 10

## 2020-09-27 MED ORDER — PHENYLEPHRINE 40 MCG/ML (10ML) SYRINGE FOR IV PUSH (FOR BLOOD PRESSURE SUPPORT)
PREFILLED_SYRINGE | INTRAVENOUS | Status: AC
  Filled 2020-09-27: qty 10

## 2020-09-27 MED ORDER — BUPIVACAINE LIPOSOME 1.3 % IJ SUSP
INTRAMUSCULAR | Status: AC
  Filled 2020-09-27: qty 20

## 2020-09-27 MED ORDER — INSULIN ASPART 100 UNIT/ML IJ SOLN: 0.0000 [IU] | Freq: Three times a day (TID) | INTRAMUSCULAR | Status: DC

## 2020-09-27 MED ORDER — ATORVASTATIN CALCIUM 40 MG PO TABS
80.0000 mg | ORAL_TABLET | Freq: Every day | ORAL | Status: DC
  Administered 2020-09-27: 80 mg via ORAL
  Filled 2020-09-27: qty 2

## 2020-09-27 MED ORDER — ONDANSETRON HCL 4 MG/2ML IJ SOLN: 4.0000 mg | Freq: Once | INTRAMUSCULAR | Status: DC | PRN

## 2020-09-27 MED ORDER — BUPIVACAINE LIPOSOME 1.3 % IJ SUSP
INTRAMUSCULAR | Status: DC | PRN
  Administered 2020-09-27: 20 mL

## 2020-09-27 MED ORDER — INSULIN ASPART 100 UNIT/ML IJ SOLN: 5.0000 [IU] | Freq: Once | INTRAMUSCULAR | Status: AC

## 2020-09-27 MED ORDER — FUROSEMIDE 20 MG PO TABS
20.0000 mg | ORAL_TABLET | Freq: Every day | ORAL | Status: DC
  Administered 2020-09-28: 20 mg via ORAL
  Filled 2020-09-27: qty 1

## 2020-09-27 MED ORDER — SENNOSIDES-DOCUSATE SODIUM 8.6-50 MG PO TABS
2.0000 | ORAL_TABLET | Freq: Every day | ORAL | Status: DC
  Administered 2020-09-27: 2 via ORAL
  Filled 2020-09-27: qty 2

## 2020-09-27 MED ORDER — LACTATED RINGERS IR SOLN
Status: DC | PRN
  Administered 2020-09-27: 1000 mL

## 2020-09-27 MED ORDER — STERILE WATER FOR IRRIGATION IR SOLN
Status: DC | PRN
  Administered 2020-09-27: 1000 mL

## 2020-09-27 MED ORDER — ONDANSETRON HCL 4 MG/2ML IJ SOLN: 4.0000 mg | INTRAMUSCULAR | Status: DC | PRN

## 2020-09-27 MED ORDER — DEXAMETHASONE SODIUM PHOSPHATE 10 MG/ML IJ SOLN
INTRAMUSCULAR | Status: AC
  Filled 2020-09-27: qty 1

## 2020-09-27 MED ORDER — DIGOXIN 125 MCG PO TABS
0.1250 mg | ORAL_TABLET | ORAL | Status: DC
  Administered 2020-09-28: 0.125 mg via ORAL
  Filled 2020-09-27: qty 1

## 2020-09-27 MED ORDER — PROPOFOL 10 MG/ML IV BOLUS
INTRAVENOUS | Status: DC | PRN
  Administered 2020-09-27: 20 mg via INTRAVENOUS
  Administered 2020-09-27: 30 mg via INTRAVENOUS
  Administered 2020-09-27: 80 mg via INTRAVENOUS

## 2020-09-27 MED ORDER — OXYCODONE HCL 5 MG/5ML PO SOLN: 5.0000 mg | Freq: Once | ORAL | Status: DC | PRN

## 2020-09-27 MED ORDER — LACTATED RINGERS IV SOLN: INTRAVENOUS | Status: DC | PRN

## 2020-09-27 MED ORDER — POLYETHYLENE GLYCOL 3350 17 GM/SCOOP PO POWD: 1.0000 | Freq: Once | ORAL | Status: DC

## 2020-09-27 MED ORDER — SUGAMMADEX SODIUM 200 MG/2ML IV SOLN
INTRAVENOUS | Status: DC | PRN
  Administered 2020-09-27: 200 mg via INTRAVENOUS

## 2020-09-27 MED ORDER — ROCURONIUM BROMIDE 10 MG/ML (PF) SYRINGE
PREFILLED_SYRINGE | INTRAVENOUS | Status: DC | PRN
  Administered 2020-09-27: 70 mg via INTRAVENOUS
  Administered 2020-09-27: 30 mg via INTRAVENOUS

## 2020-09-27 MED ORDER — CEFAZOLIN SODIUM-DEXTROSE 2-4 GM/100ML-% IV SOLN
2.0000 g | INTRAVENOUS | Status: AC
  Administered 2020-09-27: 2 g via INTRAVENOUS
  Filled 2020-09-27: qty 100

## 2020-09-27 MED ORDER — INSULIN ASPART 100 UNIT/ML IJ SOLN
0.0000 [IU] | Freq: Three times a day (TID) | INTRAMUSCULAR | Status: DC
  Administered 2020-09-27: 2 [IU] via SUBCUTANEOUS

## 2020-09-27 MED ORDER — SODIUM CHLORIDE 0.9% FLUSH
INTRAVENOUS | Status: DC | PRN
  Administered 2020-09-27: 20 mL

## 2020-09-27 MED ORDER — INSULIN ASPART 100 UNIT/ML IJ SOLN
5.0000 [IU] | Freq: Once | INTRAMUSCULAR | Status: AC
  Administered 2020-09-27: 5 [IU] via SUBCUTANEOUS

## 2020-09-27 MED ORDER — PHENYLEPHRINE HCL-NACL 20-0.9 MG/250ML-% IV SOLN
INTRAVENOUS | Status: DC | PRN
  Administered 2020-09-27: 20 ug/min via INTRAVENOUS
  Administered 2020-09-27: 50 ug/min via INTRAVENOUS

## 2020-09-27 MED ORDER — PHENYLEPHRINE 40 MCG/ML (10ML) SYRINGE FOR IV PUSH (FOR BLOOD PRESSURE SUPPORT)
PREFILLED_SYRINGE | INTRAVENOUS | Status: DC | PRN
  Administered 2020-09-27: 80 ug via INTRAVENOUS

## 2020-09-27 MED ORDER — CHLORHEXIDINE GLUCONATE 0.12 % MT SOLN
15.0000 mL | Freq: Once | OROMUCOSAL | Status: AC
  Administered 2020-09-27: 15 mL via OROMUCOSAL

## 2020-09-27 MED ORDER — OXYCODONE-ACETAMINOPHEN 5-325 MG PO TABS: 1.0000 | ORAL_TABLET | Freq: Four times a day (QID) | ORAL | 0 refills | Status: DC | PRN

## 2020-09-27 MED ORDER — MIDAZOLAM HCL 5 MG/5ML IJ SOLN
INTRAMUSCULAR | Status: DC | PRN
  Administered 2020-09-27: 2 mg via INTRAVENOUS

## 2020-09-27 SURGICAL SUPPLY — 67 items
APPLICATOR SURGIFLO ENDO (HEMOSTASIS) ×2 IMPLANT
BAG COUNTER SPONGE SURGICOUNT (BAG) IMPLANT
CHLORAPREP W/TINT 26 (MISCELLANEOUS) ×2 IMPLANT
CLIP LIGATING HEMO LOK XL GOLD (MISCELLANEOUS) IMPLANT
CLIP LIGATING HEMO O LOK GREEN (MISCELLANEOUS) ×6 IMPLANT
CLIP SUT LAPRA TY ABSORB (SUTURE) ×2 IMPLANT
COVER SURGICAL LIGHT HANDLE (MISCELLANEOUS) ×2 IMPLANT
COVER TIP SHEARS 8 DVNC (MISCELLANEOUS) ×1 IMPLANT
COVER TIP SHEARS 8MM DA VINCI (MISCELLANEOUS) ×2
CUTTER ECHEON FLEX ENDO 45 340 (ENDOMECHANICALS) IMPLANT
DECANTER SPIKE VIAL GLASS SM (MISCELLANEOUS) ×2 IMPLANT
DERMABOND ADVANCED (GAUZE/BANDAGES/DRESSINGS) ×1
DERMABOND ADVANCED .7 DNX12 (GAUZE/BANDAGES/DRESSINGS) ×1 IMPLANT
DRAIN CHANNEL 15F RND FF 3/16 (WOUND CARE) ×2 IMPLANT
DRAPE ARM DVNC X/XI (DISPOSABLE) ×4 IMPLANT
DRAPE COLUMN DVNC XI (DISPOSABLE) ×1 IMPLANT
DRAPE DA VINCI XI ARM (DISPOSABLE) ×8
DRAPE DA VINCI XI COLUMN (DISPOSABLE) ×2
DRAPE INCISE IOBAN 66X45 STRL (DRAPES) ×2 IMPLANT
DRAPE SHEET LG 3/4 BI-LAMINATE (DRAPES) ×2 IMPLANT
DRSG TEGADERM 4X4.75 (GAUZE/BANDAGES/DRESSINGS) ×2 IMPLANT
ELECT PENCIL ROCKER SW 15FT (MISCELLANEOUS) ×2 IMPLANT
ELECT REM PT RETURN 15FT ADLT (MISCELLANEOUS) ×2 IMPLANT
EVACUATOR SILICONE 100CC (DRAIN) ×2 IMPLANT
GLOVE SURG ENC MOIS LTX SZ6.5 (GLOVE) ×2 IMPLANT
GLOVE SURG ENC TEXT LTX SZ7.5 (GLOVE) ×4 IMPLANT
GOWN STRL REUS W/TWL LRG LVL3 (GOWN DISPOSABLE) ×4 IMPLANT
HEMOSTAT SURGICEL 4X8 (HEMOSTASIS) ×2 IMPLANT
HOLDER FOLEY CATH W/STRAP (MISCELLANEOUS) ×2 IMPLANT
IRRIG SUCT STRYKERFLOW 2 WTIP (MISCELLANEOUS) ×2
IRRIGATION SUCT STRKRFLW 2 WTP (MISCELLANEOUS) ×1 IMPLANT
KIT BASIN OR (CUSTOM PROCEDURE TRAY) ×2 IMPLANT
KIT TURNOVER KIT A (KITS) ×2 IMPLANT
LOOP VESSEL MAXI BLUE (MISCELLANEOUS) ×2 IMPLANT
MARKER SKIN DUAL TIP RULER LAB (MISCELLANEOUS) ×2 IMPLANT
NEEDLE INSUFFLATION 14GA 120MM (NEEDLE) ×2 IMPLANT
NS IRRIG 1000ML POUR BTL (IV SOLUTION) ×2 IMPLANT
PORT ACCESS TROCAR AIRSEAL 12 (TROCAR) ×1 IMPLANT
PORT ACCESS TROCAR AIRSEAL 5M (TROCAR) ×1
POUCH SPECIMEN RETRIEVAL 10MM (ENDOMECHANICALS) ×2 IMPLANT
PROTECTOR NERVE ULNAR (MISCELLANEOUS) ×4 IMPLANT
SEAL CANN UNIV 5-8 DVNC XI (MISCELLANEOUS) ×4 IMPLANT
SEAL XI 5MM-8MM UNIVERSAL (MISCELLANEOUS) ×8
SET TRI-LUMEN FLTR TB AIRSEAL (TUBING) ×2 IMPLANT
SOLUTION ELECTROLUBE (MISCELLANEOUS) ×2 IMPLANT
SPONGE T-LAP 4X18 ~~LOC~~+RFID (SPONGE) ×2 IMPLANT
STAPLE RELOAD 45 WHT (STAPLE) IMPLANT
STAPLE RELOAD 45MM WHITE (STAPLE)
SURGIFLO W/THROMBIN 8M KIT (HEMOSTASIS) ×2 IMPLANT
SUT ETHILON 3 0 PS 1 (SUTURE) ×2 IMPLANT
SUT MNCRL AB 4-0 PS2 18 (SUTURE) ×4 IMPLANT
SUT PDS AB 1 CT1 27 (SUTURE) ×4 IMPLANT
SUT V-LOC BARB 180 2/0GR6 GS22 (SUTURE)
SUT VIC AB 0 CT1 27 (SUTURE) ×8
SUT VIC AB 0 CT1 27XBRD ANTBC (SUTURE) ×4 IMPLANT
SUT VIC AB 2-0 SH 27 (SUTURE) ×4
SUT VIC AB 2-0 SH 27X BRD (SUTURE) ×2 IMPLANT
SUT VLOC BARB 180 ABS3/0GR12 (SUTURE) ×2
SUTURE V-LC BRB 180 2/0GR6GS22 (SUTURE) IMPLANT
SUTURE VLOC BRB 180 ABS3/0GR12 (SUTURE) ×1 IMPLANT
TOWEL OR 17X26 10 PK STRL BLUE (TOWEL DISPOSABLE) ×2 IMPLANT
TOWEL OR NON WOVEN STRL DISP B (DISPOSABLE) ×2 IMPLANT
TRAY FOLEY MTR SLVR 16FR STAT (SET/KITS/TRAYS/PACK) ×2 IMPLANT
TRAY LAPAROSCOPIC (CUSTOM PROCEDURE TRAY) ×2 IMPLANT
TROCAR BLADELESS OPT 5 100 (ENDOMECHANICALS) IMPLANT
TROCAR XCEL 12X100 BLDLESS (ENDOMECHANICALS) ×2 IMPLANT
WATER STERILE IRR 1000ML POUR (IV SOLUTION) ×4 IMPLANT

## 2020-09-27 NOTE — H&P (Signed)
Caleb Bell is an 65 y.o. male.    Chief Complaint: Pre-OP LEFT Partial Nephrectomy  HPI:   1 -  Stage 3 Renal Insufficiency - Cr 1.6s / GFR 40-50 x years. CT 01/2017 no hydro.   2- Enlarging Left Renal Mass - 1cm left mid lateral enhancing mass by CT 01/2017 (noted in hindsight). 1 artery / 1 vein (small lumbar below artery) left renovascular anatomy   Recent Surveillance:  06/2017 - stable small left mass, 1cm , stable pancreatic duct swelling w/o mass  06/2018 - progression to 1.5cm left mass, enhancing c/w RCC, and small right 1cm non-complex cyst, stable panc duct swelling.  06/2019 - progression to 2cm  08/2020 - progression to 2.6cm, also progressive 1.1 cm mildly complex cyst that is just anterior.   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 His PCP is Lavone Orn MD with [facility] at [place]. 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 His wife in [place].   Today " [name] " is seen to proceed with LEFT partial nephrectomy for enlarging left renal mass. NO interval fevers. Cr 1.6, Hgb 12.6, C19 screen negative, cards clearance on file and holiding [place].   Past Medical History:  Diagnosis Date   AICD (automatic cardioverter/defibrillator) present    Atrial fibrillation (HCC)    Paroxysmal, limited   CAD (coronary artery disease)    CABG 123456   Chronic systolic heart failure (HCC)    EF 20-25% echo 5- 2011   CRI (chronic renal insufficiency)     multifactorial... Dr. Posey Pronto.. consult September 18, 2009   DM type 2 (diabetes mellitus, type 2) ([facility])    Dr Loanne Drilling   Ejection fraction < 50%    EF 25%, echo, November, 2012  //   planning followup 2-D echo to assess LV function with CRT D.  device in place   Elevated serum creatinine    Epididymal cyst 2017   right, no testis masses   Hodgkin's disease     Dx aprox 2004, s/p  XRT-Chemo/had radiation too   Hyperkalemia    August, 2011, Aldactone, Aldactone stopped   Hyperlipidemia    low HDL   Hypothyroidism    ICD (implantable cardiac defibrillator) in place    CRT-D placed March, 2013   Ischemic cardiomyopathy    MRI12/12 no real viability in hypo/akinetic segment  CAth native and graft disease   IVCD (intraventricular conduction defect)    Myocardial infarction ([place])    mild MI/ several MI per pt in the past.   Personal history of colonic adenomas 12/24/2011   Polysubstance    Alcohol, cocaine- resoloved for many years    Presence of permanent cardiac pacemaker     Past Surgical History:  Procedure Laterality Date   BI-VENTRICULAR IMPLANTABLE CARDIOVERTER DEFIBRILLATOR N/A 04/01/2011   Procedure: BI-VENTRICULAR IMPLANTABLE CARDIOVERTER DEFIBRILLATOR  (CRT-D);  Surgeon: Deboraha Sprang, MD;  Location: [facility] CATH LAB;  Service: Cardiovascular;  Laterality: N/A;   BIV ICD GENERATOR CHANGEOUT N/A 03/10/2017   Procedure: BIV ICD GENERATOR CHANGEOUT;  Surgeon: Deboraha Sprang, MD;  Location: [facility] CV LAB;  Service: Cardiovascular;  Laterality: N/A;   CARDIAC DEFIBRILLATOR PLACEMENT  03/2011   CATARACT EXTRACTION     COLONOSCOPY  12/24/2011   Procedure: COLONOSCOPY;  Surgeon: [name], MD;  Location: [facility] ENDOSCOPY;  Service: Endoscopy;  Laterality: N/A;  COLONOSCOPY WITH PROPOFOL N/A 06/30/2017   Procedure: COLONOSCOPY WITH PROPOFOL;  Surgeon: [name], MD;  Location: [facility] ENDOSCOPY;  Service: Endoscopy;  Laterality: N/A;   CORONARY ARTERY BYPASS GRAFT  2003   CYSTECTOMY     Upper Back   POLYPECTOMY  06/30/2017   Procedure: POLYPECTOMY;  Surgeon: [name], MD;  Location: [facility] ENDOSCOPY;  Service: Endoscopy;;   TONSILLECTOMY     age 26    Family History  Adopted: Yes  Family history unknown: Yes   Social History:  reports that he has never smoked. He has never used smokeless tobacco. He reports that he does not drink alcohol and does  not use drugs.  Allergies: No Active Allergies  Medications Prior to Admission  Medication Sig Dispense Refill   acetaminophen (TYLENOL) 500 MG tablet Take 500-1,000 mg by mouth every 8 (eight) hours as needed for headache or moderate pain.     atorvastatin (LIPITOR) 80 MG tablet Take 80 mg by mouth at bedtime.   6   carvedilol (COREG) 12.5 MG tablet TAKE 1 TABLET BY MOUTH TWICE DAILY WITH A MEAL, 60 tablet 2   Cholecalciferol (VITAMIN D-3) 125 MCG (5000 UT) TABS Take 5,000 Units by mouth daily.     digoxin (LANOXIN) 0.125 MG tablet Take 1 tablet (0.125 mg total) by mouth every other day. 45 tablet 3   ELIQUIS 5 MG TABS tablet TAKE 1 TABLET(5 MG) BY MOUTH TWICE DAILY (Patient taking differently: Take 5 mg by mouth 2 (two) times daily.) 180 tablet 2   furosemide (LASIX) 20 MG tablet Take 20 mg by mouth daily.     insulin detemir (LEVEMIR FLEXTOUCH) 100 UNIT/ML FlexPen Inject 6 Units into the skin at bedtime. 15 mL 11   insulin lispro (HUMALOG KWIKPEN) 100 UNIT/ML KwikPen 3 times a day (just before each meal) 16-16-35 units, and pen needles 4/day (Patient taking differently: Inject 16-35 Units into the skin See admin instructions. Inject 16 units into the skin the skin with breakfast and lunch and 35 units with dinner) 75 mL 3   levothyroxine (SYNTHROID, LEVOTHROID) 88 MCG tablet Take 1 tablet (88 mcg total) by mouth daily before breakfast. 30 tablet 0   sacubitril-valsartan (ENTRESTO) 24-26 MG Take 1 tablet by mouth 2 (two) times daily. 60 tablet 11   spironolactone (ALDACTONE) 25 MG tablet TAKE 1/2 TABLET BY MOUTH DAILY 45 tablet 3   BAYER MICROLET LANCETS lancets USE TO CHECK BLOOD SUGAR 2 TIMES PER DAY. 100 each 12   glucose blood (CONTOUR NEXT TEST) test strip 1 each by Other route in the morning and at bedtime. And lancets 2/day 200 each 3   Insulin Pen Needle (PEN NEEDLES) 32G X 4 MM MISC Use to inject  3 times a day into the skin 200 each 5    Results for orders placed or performed in  visit on 09/26/20 (from the past 48 hour(s))  SARS Coronavirus 2 (TAT 6-24 hrs)     Status: None   Collection Time: 09/26/20 12:00 AM  Result Value Ref Range   SARS Coronavirus 2 RESULT: NEGATIVE     Comment: RESULT: NEGATIVESARS-CoV-2 INTERPRETATION:A NEGATIVE  test result means that SARS-CoV-2 RNA was not present in the specimen above the limit of detection of this test. This does not preclude a possible SARS-CoV-2 infection and should not be used as the  sole basis for patient management decisions. Negative results must be combined with clinical observations, patient history, and epidemiological information. Optimum specimen types  and timing for peak viral levels during infections caused by SARS-CoV-2  have not been determined. Collection of multiple specimens or types of specimens may be necessary to detect virus. Improper specimen collection and handling, sequence variability under primers/probes, or organism present below the limit of detection may  lead to false negative results. Positive and negative predictive values of testing are highly dependent on prevalence. False negative test results are more likely when prevalence of disease is high.The expected result is NEGATIVE.Fact S heet for  Healthcare Providers: LocalChronicle.no Sheet for Patients: SalonLookup.es Reference Range - Negative    No results found.  Review of Systems  Constitutional:  Negative for chills and fever.  All other systems reviewed and are negative.  Blood pressure 112/67, pulse 62, temperature 98.2 F (36.8 C), temperature source Oral, resp. rate 16, SpO2 96 %. Physical Exam Vitals reviewed.  Constitutional:      Comments: Very pleasant, at baseline.   HENT:     Head: Normocephalic.     Nose: Nose normal.  Eyes:     Pupils: Pupils are equal, round, and reactive to light.  Cardiovascular:     Rate and Rhythm: Normal rate.     Pulses: Normal  pulses.  Abdominal:     General: Abdomen is flat.  Genitourinary:    Comments: No CVAT Musculoskeletal:        General: Normal range of motion.  Skin:    General: Skin is warm.  Neurological:     General: No focal deficit present.     Mental Status: He is alert.  Psychiatric:        Mood and Affect: Mood normal.     Assessment/Plan  Proceed as planned with LEFT partial nephrectomy. Risks, benefits, alternatives, expected peri-op course discussed previously and reiterated today.   Alexis Frock, MD 09/27/2020, 7:09 AM

## 2020-09-27 NOTE — Anesthesia Procedure Notes (Signed)
Arterial Line Insertion Start/End9/07/2020 8:00 AM, 09/27/2020 8:06 AM Performed by: Lieutenant Diego, CRNA, CRNA  Patient location: Pre-op. Preanesthetic checklist: patient identified, IV checked, site marked, risks and benefits discussed, surgical consent, monitors and equipment checked, pre-op evaluation, timeout performed and anesthesia consent Lidocaine 1% used for infiltration and patient sedated Right, radial was placed Catheter size: 20 G Hand hygiene performed  and maximum sterile barriers used  Allen's test indicative of satisfactory collateral circulation Attempts: 1 Procedure performed without using ultrasound guided technique. Following insertion, dressing applied and Biopatch. Post procedure assessment: normal  Patient tolerated the procedure well with no immediate complications.

## 2020-09-27 NOTE — Discharge Instructions (Signed)

## 2020-09-27 NOTE — Brief Op Note (Signed)
09/27/2020  11:03 AM  PATIENT:  Caleb Bell  65 y.o. male  PRE-OPERATIVE DIAGNOSIS:  ENLARGING LEFT RENAL MASS, Small Left Renal Cyst  POST-OPERATIVE DIAGNOSIS:  ENLARGING LEFT RENAL MASS, Small Left Renal Cyst  PROCEDURE:  Procedure(s) with comments: XI ROBOTIC ASSITED PARTIAL NEPHRECTOMY (Left) - 3 HRS INtraoperative Ultrasound  SURGEON:  Surgeon(s) and Role:    Alexis Frock, MD - Primary  PHYSICIAN ASSISTANT:   ASSISTANTS: Reola Mosher MD   ANESTHESIA:   local and general  EBL:  50 mL   BLOOD ADMINISTERED:none  DRAINS:  1 - JP to bulb; 2 - Foley to gravity    LOCAL MEDICATIONS USED:  MARCAINE     SPECIMEN:  Source of Specimen:  1 - left renal mass; 2 - left renal cyst wall  DISPOSITION OF SPECIMEN:  PATHOLOGY  COUNTS:  YES  TOURNIQUET:  * No tourniquets in log *  DICTATION: .Other Dictation: Dictation Number [id number]  PLAN OF CARE: Admit to inpatient   PATIENT DISPOSITION:  PACU - hemodynamically stable.   Delay start of Pharmacological VTE agent (>24hrs) due to surgical blood loss or risk of bleeding: yes

## 2020-09-27 NOTE — Anesthesia Procedure Notes (Signed)
Procedure Name: Intubation Date/Time: 09/27/2020 8:32 AM Performed by: Lieutenant Diego, CRNA Pre-anesthesia Checklist: Patient identified, Emergency Drugs available, Suction available and Patient being monitored Patient Re-evaluated:Patient Re-evaluated prior to induction Oxygen Delivery Method: Circle system utilized Preoxygenation: Pre-oxygenation with 100% oxygen Induction Type: IV induction Ventilation: Mask ventilation without difficulty Laryngoscope Size: Miller and 2 Grade View: Grade I Tube type: Oral Tube size: 7.5 mm Number of attempts: 1 Airway Equipment and Method: Stylet Placement Confirmation: ETT inserted through vocal cords under direct vision, positive ETCO2 and breath sounds checked- equal and bilateral Secured at: 22 cm Tube secured with: Tape Dental Injury: Teeth and Oropharynx as per pre-operative assessment

## 2020-09-27 NOTE — Anesthesia Postprocedure Evaluation (Signed)
Anesthesia Post Note  Patient: Caleb Bell  Procedure(s) Performed: XI ROBOTIC ASSITED PARTIAL NEPHRECTOMY (Left)     Patient location during evaluation: PACU Anesthesia Type: General Level of consciousness: awake and alert Pain management: pain level controlled Vital Signs Assessment: post-procedure vital signs reviewed and stable Respiratory status: spontaneous breathing, nonlabored ventilation, respiratory function stable and patient connected to nasal cannula oxygen Cardiovascular status: stable and blood pressure returned to baseline Anesthetic complications: no   No notable events documented.  Last Vitals:  Vitals:   09/27/20 1200 09/27/20 1215  BP: 128/68 137/67  Pulse: 60 66  Resp: 10 11  Temp:    SpO2: 93% 93%    Last Pain:  Vitals:   09/27/20 1215  TempSrc:   PainSc: [place]

## 2020-09-27 NOTE — Addendum Note (Signed)
Addendum  created 09/27/20 1256 by Lieutenant Diego, CRNA   Charge Capture section accepted

## 2020-09-27 NOTE — Transfer of Care (Signed)
Immediate Anesthesia Transfer of Care Note  Patient: Caleb Bell  Procedure(s) Performed: XI ROBOTIC ASSITED PARTIAL NEPHRECTOMY (Left)  Patient Location: PACU  Anesthesia Type:General  Level of Consciousness: awake  Airway & Oxygen Therapy: Patient Spontanous Breathing and Patient connected to face mask oxygen  Post-op Assessment: Report given to RN and Post -op Vital signs reviewed and stable  Post vital signs: Reviewed and stable  Last Vitals:  Vitals Value Taken Time  BP 138/69 09/27/20 1119  Temp    Pulse 64 09/27/20 1120  Resp 6 09/27/20 1120  SpO2 100 % 09/27/20 1120  Vitals shown include unvalidated device data.  Last Pain:  Vitals:   09/27/20 0648  TempSrc: Oral  PainSc: 0-No pain         Complications: No notable events documented.

## 2020-09-28 ENCOUNTER — Encounter (HOSPITAL_COMMUNITY): Payer: Self-pay | Admitting: Urology

## 2020-09-28 DIAGNOSIS — C642 Malignant neoplasm of left kidney, except renal pelvis: Secondary | ICD-10-CM | POA: Diagnosis not present

## 2020-09-28 LAB — BASIC METABOLIC PANEL
Anion gap: 9 (ref 5–15)
BUN: 30 mg/dL — ABNORMAL HIGH (ref 8–23)
CO2: 25 mmol/L (ref 22–32)
Calcium: 9.1 mg/dL (ref 8.9–10.3)
Chloride: 103 mmol/L (ref 98–111)
Creatinine, Ser: 1.6 mg/dL — ABNORMAL HIGH (ref 0.61–1.24)
GFR, Estimated: 48 mL/min — ABNORMAL LOW (ref >60)
Glucose, Bld: 207 mg/dL — ABNORMAL HIGH (ref 70–99)
Potassium: 4.5 mmol/L (ref 3.5–5.1)
Sodium: 137 mmol/L (ref 135–145)

## 2020-09-28 LAB — CBC
HCT: 36.3 % — ABNORMAL LOW (ref 39.0–52.0)
Hemoglobin: 12.3 g/dL — ABNORMAL LOW (ref 13.0–17.0)
MCH: 28 pg (ref 26.0–34.0)
MCHC: 33.9 g/dL (ref 30.0–36.0)
MCV: 82.7 fL (ref 80.0–100.0)
Platelets: 202 10*3/uL (ref 150–400)
RBC: 4.39 MIL/uL (ref 4.22–5.81)
RDW: 13.9 % (ref 11.5–15.5)
WBC: 12.2 10*3/uL — ABNORMAL HIGH (ref 4.0–10.5)
nRBC: 0 % (ref 0.0–0.2)

## 2020-09-28 LAB — SURGICAL PATHOLOGY

## 2020-09-28 LAB — CREATININE, FLUID (PLEURAL, PERITONEAL, JP DRAINAGE): Creat, Fluid: 1.6 mg/dL

## 2020-09-28 NOTE — Discharge Summary (Signed)
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 07/21/2019   IVCD (intraventricular conduction defect)    Ischemic cardiomyopathy    Hypothyroidism    DM type 2 (diabetes mellitus, type 2) ([place])    CRI (chronic renal insufficiency)    Diabetes ([place]) 03/22/2015   BPH (benign prostatic hyperplasia) 02/15/2014   Carotid artery disease without cerebral infarction ([place]) 16/10/9602   Chronic systolic CHF (congestive heart failure) ([place]) 11/30/2012   Hx of colonic polyps 12/24/2011   Automatic implantable cardioverter-defibrillator in situ    Hx of radiation therapy    INCREASED BLOOD PRESSURE 02/17/2008   ERECTILE DYSFUNCTION 02/03/2008   HYPERLIPIDEMIA 05/12/2007   HYPOTHYROIDISM 02/15/2006   Ischemic cardiomyopathy-status post CABG x5 2003 02/15/2006   HX, PERSONAL, HODGKIN'S DISEASE 02/15/2006   Paroxysmal atrial fibrillation ([place]) 02/15/2006    Procedures performed: Procedure(s): XI ROBOTIC ASSITED PARTIAL NEPHRECTOMY  History and Physical: For full details, please see admission history and physical. Briefly, Caleb Bell is a 65 y.o. year old patient with  left solid  renal mass in 2019, at that time, it was quite small at only about a centimeter.  Given his cardiovascular comorbidity, we elected surveillance and he has been very compliant with this.  He unfortunately has progression of this renal mass steadily now  to greater than 2.5 cm and again his functional status remains quite good.  Options were discussed for management including recommended path of definitive therapy either with ablation versus partial nephrectomy.  He wished to proceed with the  latter.  Hospital Course: Patient tolerated the procedure well.  He was then transferred to the floor after an uneventful PACU stay.  His hospital course was uncomplicated.  On POD#1 he had met discharge criteria: was eating a regular diet, was up and ambulating independently,  pain was well controlled, was voiding without a catheter, and was ready to for discharge.   Laboratory values:  Recent Labs    09/27/20 1150 09/28/20 0422  WBC  --  12.2*  HGB 12.9* 12.3*  HCT 38.1* 36.3*   Recent Labs    09/28/20 0422  NA 137  K 4.5  CL 103  CO2 25  GLUCOSE 207*  BUN 30*  CREATININE 1.60*  CALCIUM 9.1   No results for input(s): LABPT, INR in the last 72 hours. No results for input(s): LABURIN in the last 72 hours. Results for orders placed or performed in visit on 09/26/20  SARS Coronavirus 2 (TAT 6-24 hrs)     Status: None   Collection Time: 09/26/20 12:00 AM  Result Value Ref Range Status   SARS Coronavirus 2 RESULT: NEGATIVE  Final    Comment: RESULT: NEGATIVESARS-CoV-2 INTERPRETATION:A NEGATIVE  test result means that SARS-CoV-2 RNA was not present in the specimen above the limit of detection of this test. This does not preclude a possible SARS-CoV-2 infection and should not be used as the  sole basis for patient management decisions. Negative results must be combined with clinical observations, patient history, and epidemiological information. Optimum specimen types and timing for peak viral levels during infections caused by SARS-CoV-2  have not been determined. Collection of multiple specimens or types of specimens may be necessary to detect virus.  Improper specimen collection and handling, sequence variability under primers/probes, or organism present below the limit of detection may  lead to false negative results. Positive and negative predictive values of testing are highly dependent on prevalence. False negative test results are more likely when prevalence of disease is  high.The expected result is NEGATIVE.Fact S heet for  Healthcare Providers: LocalChronicle.no Sheet for Patients: SalonLookup.es Reference Range - Negative     Disposition: Home  Discharge instruction: The patient was instructed to be ambulatory but told to refrain from heavy lifting, strenuous activity, or driving.  Discharge medications:  Allergies as of 09/28/2020   No Active Allergies      Medication List     STOP taking these medications    Eliquis 5 MG Tabs tablet Generic drug: apixaban       TAKE these medications    acetaminophen 500 MG tablet Commonly known as: TYLENOL Take 500-1,000 mg by mouth every 8 (eight) hours as needed for headache or moderate pain.   atorvastatin 80 MG tablet Commonly known as: LIPITOR Take 80 mg by mouth at bedtime.   Bayer Microlet Lancets lancets USE TO CHECK BLOOD SUGAR 2 TIMES PER DAY.   carvedilol 12.5 MG tablet Commonly known as: COREG TAKE 1 TABLET BY MOUTH TWICE DAILY WITH A MEAL,   Contour Next Test test strip Generic drug: glucose blood 1 each by Other route in the morning and at bedtime. And lancets 2/day   digoxin 0.125 MG tablet Commonly known as: LANOXIN Take 1 tablet (0.125 mg total) by mouth every other day.   furosemide 20 MG tablet Commonly known as: LASIX Take 20 mg by mouth daily.   insulin lispro 100 UNIT/ML KwikPen Commonly known as: HumaLOG KwikPen 3 times a day (just before each meal) 16-16-35 units, and pen needles 4/day What changed:  how much to take how to take this when to take this additional instructions   Levemir FlexTouch 100 UNIT/ML FlexPen Generic drug: insulin detemir Inject 6 Units into the skin at bedtime.   levothyroxine 88 MCG tablet Commonly known as: SYNTHROID Take 1 tablet (88 mcg total) by mouth daily before breakfast.   oxyCODONE-acetaminophen 5-325 MG tablet Commonly known as: Percocet Take 1 tablet by  mouth every 6 (six) hours as needed for severe pain or moderate pain (post-operatively).   Pen Needles 32G X 4 MM Misc Use to inject  3 times a day into the skin   sacubitril-valsartan 24-26 MG Commonly known as: ENTRESTO Take 1 tablet by mouth 2 (two) times daily.   senna-docusate 8.6-50 MG tablet Commonly known as: Senokot-S Take 1 tablet by mouth 2 (two) times daily. While taking strong pain meds to prevent constipation   spironolactone 25 MG tablet Commonly known as: ALDACTONE TAKE 1/2 TABLET BY MOUTH DAILY   Vitamin D-3 125 MCG (5000 UT) Tabs Take 5,000 Units by mouth daily.        Followup:   Follow-up Information     Alexis Frock, MD Follow up on 10/17/2020.   Specialty: Urology Why: at 1:45 for MD visit and pathology review. Contact information: [address] [address] [phone]

## 2020-09-28 NOTE — Plan of Care (Signed)
  Problem: Education: Goal: Knowledge of General Education information will improve Description: Including pain rating scale, medication(s)/side effects and non-pharmacologic comfort measures Outcome: Adequate for Discharge   Problem: Health Behavior/Discharge Planning: Goal: Ability to manage health-related needs will improve Outcome: Adequate for Discharge   Problem: Clinical Measurements: Goal: Ability to maintain clinical measurements within normal limits will improve Outcome: Adequate for Discharge Goal: Will remain free from infection Outcome: Adequate for Discharge Goal: Diagnostic test results will improve Outcome: Adequate for Discharge Goal: Respiratory complications will improve Outcome: Adequate for Discharge Goal: Cardiovascular complication will be avoided Outcome: Adequate for Discharge   Problem: Activity: Goal: Risk for activity intolerance will decrease Outcome: Adequate for Discharge   Problem: Nutrition: Goal: Adequate nutrition will be maintained Outcome: Adequate for Discharge   Problem: Coping: Goal: Level of anxiety will decrease Outcome: Adequate for Discharge   Problem: Elimination: Goal: Will not experience complications related to bowel motility Outcome: Adequate for Discharge Goal: Will not experience complications related to urinary retention Outcome: Adequate for Discharge   Problem: Pain Managment: Goal: General experience of comfort will improve Outcome: Adequate for Discharge   Problem: Safety: Goal: Ability to remain free from injury will improve Outcome: Adequate for Discharge   Problem: Skin Integrity: Goal: Risk for impaired skin integrity will decrease Outcome: Adequate for Discharge   Problem: Education: Goal: Knowledge of the prescribed therapeutic regimen will improve Outcome: Adequate for Discharge   Problem: Bowel/Gastric: Goal: Gastrointestinal status for postoperative course will improve Outcome: Adequate for  Discharge   Problem: Clinical Measurements: Goal: Postoperative complications will be avoided or minimized Outcome: Adequate for Discharge   Problem: Respiratory: Goal: Ability to achieve and maintain a regular respiratory rate will improve Outcome: Adequate for Discharge   Problem: Skin Integrity: Goal: Demonstration of wound healing without infection will improve Outcome: Adequate for Discharge   Problem: Urinary Elimination: Goal: Ability to avoid or minimize complications of infection will improve Outcome: Adequate for Discharge Goal: Ability to achieve and maintain urine output will improve Outcome: Adequate for Discharge

## 2020-09-28 NOTE — Op Note (Signed)
NAME: REMY, KIHM MEDICAL RECORD NO: [id number] ACCOUNT NO: [id number] DATE OF BIRTH: 25-Sep-1955 FACILITY: [facility] LOCATION: [facility] PHYSICIAN: Alexis Frock, MD  Operative Report   DATE OF PROCEDURE: 09/27/2020  PREOPERATIVE DIAGNOSES:  Enlarging left renal mass, small left renal cyst.  PROCEDURE: 1.  Robotic-assisted laparoscopic left partial nephrectomy with renal cyst decortication. 2.  Intraoperative ultrasound with interpretation.  ESTIMATED BLOOD LOSS:  50 mL.  COMPLICATIONS:  None.  SPECIMENS:  1.  Left partial nephrectomy. 2.  Left renal cyst wall for permanent pathology.  FINDINGS:  1.  Single artery, single vein, vein with multiple branches, left renal vascular anatomy. 2.  Simple appearing left renal cyst by intraoperative ultrasound. 3.  Solid vascular 50% exophytic left renal mass by intraoperative ultrasound.  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  He does have significant cardiovascular comorbidity that is very well controlled, and he has an excellent functional status.  He was found incidentally to have a left solid  renal mass in 2019, at that time, it was quite small at only about a centimeter.  Given his cardiovascular comorbidity, we elected surveillance and he has been very compliant with this.  He unfortunately has progression of this renal mass steadily now  to greater than 2.5 cm and again his functional status remains quite good.  Options were discussed for management including recommended path of definitive therapy either with ablation versus partial nephrectomy.  He wished to proceed with the latter.   Informed consent was obtained and placed in the medical record.  PROCEDURE IN DETAIL:  The patient being verified, procedure being left robotic partial nephrectomy was confirmed.  The procedure timeout was performed.  Intravenous antibiotics were administered.  General endotracheal anesthesia induced.  The  patient was  placed in the left side up full flank position and pulling 15 degrees of table flexion, superior arm elevator, axillary roll, sequential compression devices, bottom leg bent, top leg straight.  He is further fastened to the operating table using the  beanbag, superior arm elevator, axillary roll and a 3-inch tape over padding across his supraxiphoid chest and his pelvis.  Foley catheter was placed for the straight drain.  Sterile field was created by first clipper shaving and then prepping and  draping the patient's left flank and abdomen using chlorhexidine gluconate and the high flow, low pressure pneumoperitoneum was obtained using Veress technique in left lower quadrant, having passed the aspiration drop test.  An 8 mm robotic camera port  was then placed in position approximately 1 handbreadth superior lateral to the umbilicus.  Laparoscopic examination of peritoneal cavity revealed no significant adhesions, no visceral injury.  Additional ports were placed as follows:  A left subcostal 8  mm robotic port, left far lateral 8 mm robotic port approximately 4 fingerbreadths superior medial to the anterior superior iliac spine, left paramedian inferior robotic port approximately 1 handbreadth superior to pubic ramus and two 12 mm assistant  port sites in the midline, one approximately 3 fingerbreadths superior to the umbilicus and another 4 fingerbreadths superior to this.  Robot was docked and passed the electronic checks.  Next, attention was directed to the development of the  retroperitoneum.  Incision was made lateral to the ascending colon from the area of the splenic flexure towards the area of the internal ring.  The colon was carefully swept medially.  Lateral splenic attachments were taken down, allowing the spleen to  be rotated medially and the space between the  spleen and pancreas and the anterior surface of the Gerota's fascia was carefully developed. Lower pole of the kidney area  was identified and placed on gentle lateral traction.  Dissection proceeded medial to  this. Left ureter and gonadal vessels were encountered, placed on gentle lateral traction.  The psoas musculature was identified.  Dissection proceeded within this triangle towards the area of the renal hilum. Renal hilum was somewhat complex, consisted  of a single vein,  however, with 2 lumbar veins, somewhat making the inferior window to the artery poor. However, the superior window to the artery was quite open and the common artery trunk was located in this location, this was circumferentially  mobilized and marked with a vessel loop.  We then directed attention to identification of the renal mass.  The renal mass was found as expected adjacent and just lateral to a smaller renal cyst in the lateral aspect of the kidney and the area overlying  these structures was defatted.  Intraoperative ultrasound was then performed.  Intraoperative ultrasound revealed approximately 50% exophytic solid vascular left renal mass as anticipated.  The borders of the planned plane of partial nephrectomy were scored and verified by ultrasound to be sufficient.  The area of the kidney cyst  revealed a minimally complex cyst, less so than it was anticipated, given his imaging findings, it was felt that a simple decortication would be sufficient for this.  Warm ischemia was then achieved by placing two bulldog clamps on the artery and partial  nephrectomy was performed with the solid mass, keeping what appeared to be a rim of normal parenchyma with the nephrectomy specimen, which was then set aside for permanent pathology.  First layer renorrhaphy was performed using a running 3-0 V-Loc  suture, reapproximating several small venous sinuses, bolster of Surgicel was applied in a second layer renorrhaphy with interrupted Vicryl sandwiched with Hem-o-lok and Lapra-Tys x3.  Bulldog clamps were then released for a total warm ischemia time of  15  minutes. There was excellent parenchymal apposition and excellent hemostasis of the partial nephrectomy area. The renal cyst was controlled by just a simple decortication of the cyst wall which was relatively avascular and a sample of the cyst wall  was set aside for permanent pathology and the entire base of the cyst wall was fulgurated, thus ablating it completely.  Gerota's was reapproximated by bringing fat back over the area of renorrhaphy, tacking in place using running Vicryl.  The hilar area was  inspected and found to be hemostatic.  Vessel loop was removed.  Sponge and needle counts were correct.  Robot was then undocked.  The superior most 12 mm assistant port site was closed at the level of fascia using Carter-Thomason suture passer and 0  Vicryl under laparoscopic vision.  The specimen was retrieved by extending the inferior assistant port for a distance of approximately 2 cm total, removing the partial nephrectomy specimen, setting aside for permanent pathology.  This site was closed at  the level of fascia using figure-of-eight PDS, followed by reapproximation of Scarpa's with interrupted Vicryl.  All incision sites were infiltrated with dilute lipolyzed Marcaine and closed at the level of skin using subcuticular Monocryl and Dermabond.   Closed suction drain was placed immediately before robot undocking via the lateral most assistant port site and this was sutured in place with drain stitch and connected to JP bulb.  Procedure was terminated.  The patient tolerated procedure well, no immediate  perioperative complications.  The patient was  taken to postanesthesia care in stable condition.   PLAN:  For inpatient admission, pending hemodynamic stability.   SHW D: 09/27/2020 11:12:50 am T: 09/27/2020 10:08:00 pm  JOB: [id number]

## 2020-09-29 LAB — GLUCOSE, CAPILLARY
Glucose-Capillary: 339 mg/dL — ABNORMAL HIGH (ref 70–99)
Glucose-Capillary: 176 mg/dL — ABNORMAL HIGH (ref 70–99)
Glucose-Capillary: 384 mg/dL — ABNORMAL HIGH (ref 70–99)

## 2020-10-27 ENCOUNTER — Other Ambulatory Visit: Payer: Self-pay | Admitting: Internal Medicine

## 2020-10-27 MED ORDER — SACUBITRIL-VALSARTAN 24-26 MG PO TABS: 1.0000 | ORAL_TABLET | Freq: Two times a day (BID) | ORAL | 1 refills | Status: DC

## 2020-11-04 ENCOUNTER — Other Ambulatory Visit: Payer: Self-pay | Admitting: Internal Medicine

## 2020-12-04 NOTE — Progress Notes (Signed)
Cardiology Office Note   Date:  12/05/2020   ID:  Caleb Bell, DOB 02-06-1955, MRN [id number]  PCP:  Lavone Orn, MD  Cardiologist:   Dorris Carnes, MD   Pt presents for F/U of CAD and systolic CHF      History of Present Illness: Caleb Bell is a 65 y.o. male with a history of CAD, s/p CABG 2003), ischemic cardiomyopathy with  IVCD and then  CRT-D implantation (March 2013).  He is followed by Olin Pia in EP the patient also has a history of PAF, renal insuff, Hodgkins dz (s/p chemo and XRT)   Echo in Feb 2019 LVEF 30 to 35%  Mid AS  RVEF normal   Carotid Dopplers 12/15 bilateral 40-59% stenosis  I last saw the patient back in fall 2021.  He has been followed by Jolyn Nap in the interval.  He says that he gets more short of breath with stairs than he did.  Also notes some coughing.  Denies chest pain.  No PND.  No lower extremity edema.  No palpitations or dizziness.  Appetite is okay PT says with stairs more often SOB with some coughing       Current Outpatient Medications  Medication Sig Dispense Refill   acetaminophen (TYLENOL) 500 MG tablet Take 500-1,000 mg by mouth every 8 (eight) hours as needed for headache or moderate pain.     atorvastatin (LIPITOR) 80 MG tablet Take 80 mg by mouth at bedtime.   6   BAYER MICROLET LANCETS lancets USE TO CHECK BLOOD SUGAR 2 TIMES PER DAY. 100 each 12   carvedilol (COREG) 12.5 MG tablet TAKE 1 TABLET BY MOUTH TWICE DAILY WITH A MEAL 90 tablet 1   Cholecalciferol (VITAMIN D-3) 125 MCG (5000 UT) TABS Take 5,000 Units by mouth daily.     digoxin (LANOXIN) 0.125 MG tablet Take 1 tablet (0.125 mg total) by mouth every other day. 45 tablet 3   ELIQUIS 5 MG TABS tablet Take 5 mg by mouth 2 (two) times daily.     furosemide (LASIX) 20 MG tablet Take 20 mg by mouth daily.     glucose blood (CONTOUR NEXT TEST) test strip 1 each by Other route in the morning and at bedtime. And lancets 2/day 200 each 3   insulin detemir (LEVEMIR FLEXTOUCH)  100 UNIT/ML FlexPen Inject 6 Units into the skin at bedtime. 15 mL 11   insulin lispro (HUMALOG KWIKPEN) 100 UNIT/ML KwikPen 3 times a day (just before each meal) 16-16-35 units, and pen needles 4/day (Patient taking differently: Inject 16-35 Units into the skin See admin instructions. Inject 16 units into the skin the skin with breakfast and lunch and 35 units with dinner) 75 mL 3   Insulin Pen Needle (PEN NEEDLES) 32G X 4 MM MISC Use to inject  3 times a day into the skin 200 each 5   levothyroxine (SYNTHROID, LEVOTHROID) 88 MCG tablet Take 1 tablet (88 mcg total) by mouth daily before breakfast. 30 tablet 0   oxyCODONE-acetaminophen (PERCOCET) 5-325 MG tablet Take 1 tablet by mouth every 6 (six) hours as needed for severe pain or moderate pain (post-operatively). 15 tablet 0   sacubitril-valsartan (ENTRESTO) 24-26 MG Take 1 tablet by mouth 2 (two) times daily. Please keep upcoming appt in November 2022 with Dr. Harrington Challenger before anymore refills. Thank you 60 tablet 1   senna-docusate (SENOKOT-S) 8.6-50 MG tablet Take 1 tablet by mouth 2 (two) times daily. While taking strong pain meds  to prevent constipation 10 tablet 0   spironolactone (ALDACTONE) 25 MG tablet TAKE 1/2 TABLET BY MOUTH DAILY 45 tablet 3   No current facility-administered medications for this visit.    Allergies:   Patient has no known allergies.   Past Medical History:  Diagnosis Date   AICD (automatic cardioverter/defibrillator) present    Atrial fibrillation (HCC)    Paroxysmal, limited   CAD (coronary artery disease)    CABG 8921   Chronic systolic heart failure (HCC)    EF 20-25% echo 5- 2011   CRI (chronic renal insufficiency)     multifactorial... Dr. Posey Pronto.. consult September 18, 2009   DM type 2 (diabetes mellitus, type 2) ([place])    Dr Loanne Drilling   Ejection fraction < 50%    EF 25%, echo, November, 2012  //   planning followup 2-D echo to assess LV function with CRT D.  device in place   Elevated serum creatinine     Epididymal cyst 2017   right, no testis masses   Hodgkin's disease     Dx aprox 2004, s/p XRT-Chemo/had radiation too   Hyperkalemia    August, 2011, Aldactone, Aldactone stopped   Hyperlipidemia    low HDL   Hypothyroidism    ICD (implantable cardiac defibrillator) in place    CRT-D placed March, 2013   Ischemic cardiomyopathy    MRI12/12 no real viability in hypo/akinetic segment  CAth native and graft disease   IVCD (intraventricular conduction defect)    Myocardial infarction ([place])    mild MI/ several MI per pt in the past.   Personal history of colonic adenomas 12/24/2011   Polysubstance    Alcohol, cocaine- resoloved for many years    Presence of permanent cardiac pacemaker     Past Surgical History:  Procedure Laterality Date   BI-VENTRICULAR IMPLANTABLE CARDIOVERTER DEFIBRILLATOR N/A 04/01/2011   Procedure: BI-VENTRICULAR IMPLANTABLE CARDIOVERTER DEFIBRILLATOR  (CRT-D);  Surgeon: Deboraha Sprang, MD;  Location: [facility] CATH LAB;  Service: Cardiovascular;  Laterality: N/A;   BIV ICD GENERATOR CHANGEOUT N/A 03/10/2017   Procedure: BIV ICD GENERATOR CHANGEOUT;  Surgeon: Deboraha Sprang, MD;  Location: [facility] CV LAB;  Service: Cardiovascular;  Laterality: N/A;   CARDIAC DEFIBRILLATOR PLACEMENT  03/2011   CATARACT EXTRACTION     COLONOSCOPY  12/24/2011   Procedure: COLONOSCOPY;  Surgeon: [name], MD;  Location: [facility];  Service: Endoscopy;  Laterality: N/A;   COLONOSCOPY WITH PROPOFOL N/A 06/30/2017   Procedure: COLONOSCOPY WITH PROPOFOL;  Surgeon: [name], MD;  Location: [facility];  Service: Endoscopy;  Laterality: N/A;   CORONARY ARTERY BYPASS GRAFT  2003   CYSTECTOMY     Upper Back   POLYPECTOMY  06/30/2017   Procedure: POLYPECTOMY;  Surgeon: [name], MD;  Location: [facility];  Service: Endoscopy;;   ROBOTIC ASSITED PARTIAL NEPHRECTOMY Left 09/27/2020   Procedure: XI ROBOTIC ASSITED PARTIAL NEPHRECTOMY;  Surgeon: Alexis Frock, MD;   Location: [facility];  Service: Urology;  Laterality: Left;  3 HRS   TONSILLECTOMY     age 85     Social History:  The patient  reports that he has never smoked. He has never used smokeless tobacco. He reports that he does not drink alcohol and does not use drugs.   Family History:  The patient's He was adopted. Family history is unknown by patient.    ROS:  Please see the history of present illness. All other systems are reviewed and  Negative  to the above problem except as noted.    PHYSICAL EXAM: VS:  BP (!) 84/44   Pulse 67   Ht 6\' 1"  (1.854 m)   Wt 208 lb (94.3 kg)   SpO2 99%   BMI 27.44 kg/m     BP on my check 96/66  GEN: Well nourished, well developed, in no acute distress  HEENT: normal  Neck: No JVD soft murmur left carotid area Cardiac: RRR; grade 1/6systolic murmur heard best at the left sternal border.  No S3.  No lower extremity edema. Respiratory:  clear to auscultation bilaterally GI: soft, nontender, nondistended, + BS  No hepatomegaly  MS: no deformity Moving all extremities   Skin: warm and dry, no rash Neuro:  Strength and sensation are intact Psych: euthymic mood, full affect   EKG:  EKG is not ordered today    Lipid Panel    Component Value Date/Time   CHOL 168 03/17/2020 1155   TRIG 240 (H) 03/17/2020 1155   TRIG 306 (HH) 11/06/2005 1313   HDL 34 (L) 03/17/2020 1155   CHOLHDL 4.9 03/17/2020 1155   CHOLHDL 4 11/15/2013 0911   VLDL 33.8 11/15/2013 0911   LDLCALC 93 03/17/2020 1155   LDLDIRECT 66.3 11/11/2012 0932      Wt Readings from Last 3 Encounters:  12/05/20 208 lb (94.3 kg)  09/27/20 205 lb (93 kg)  09/07/20 205 lb (93 kg)      ASSESSMENT AND PLAN:  1  CAD  REmote CABG  2003  WIll set up for a lexiscan myoview to evaluate for ischemia.  2.  Chronic systolic CHF  S/p BiV ICD 3/13 with generator change in 2/19 last echo his LVEF was in the 3035% range. Iwould back down on carvedilol to 6.25 bid given low blood pressure. Set up again  for myoview   3.  HL   Lipid panel today   Did eat lunch 2 hours ago.  Take this into account when looking at labs. 4  PAF  Continue on Eliquis   4  DM  Followed in IM Last a1c 7.2   WIll repeat today   5.  CV disease very mild plaquing carotids a couple years ago.  Continue on statin.  Will see in 6 monthns    Signed, Dorris Carnes, MD  12/05/2020 3:07 PM    [facility] [address], [place], [place]  [address] Phone: [phone]; Fax: [phone]

## 2020-12-05 ENCOUNTER — Encounter: Payer: Self-pay | Admitting: Internal Medicine

## 2020-12-05 ENCOUNTER — Ambulatory Visit: Payer: Managed Care, Other (non HMO) | Admitting: Internal Medicine

## 2020-12-05 ENCOUNTER — Other Ambulatory Visit: Payer: Self-pay

## 2020-12-05 ENCOUNTER — Encounter: Payer: Self-pay | Admitting: *Deleted

## 2020-12-05 VITALS — BP 84/44 | HR 67 | Ht 73.0 in | Wt 208.0 lb

## 2020-12-05 DIAGNOSIS — I5022 Chronic systolic (congestive) heart failure: Secondary | ICD-10-CM

## 2020-12-05 DIAGNOSIS — R7889 Finding of other specified substances, not normally found in blood: Secondary | ICD-10-CM

## 2020-12-05 DIAGNOSIS — Z79899 Other long term (current) drug therapy: Secondary | ICD-10-CM

## 2020-12-05 DIAGNOSIS — E782 Mixed hyperlipidemia: Secondary | ICD-10-CM

## 2020-12-05 DIAGNOSIS — I255 Ischemic cardiomyopathy: Secondary | ICD-10-CM | POA: Diagnosis not present

## 2020-12-05 DIAGNOSIS — R0609 Other forms of dyspnea: Secondary | ICD-10-CM

## 2020-12-05 MED ORDER — CARVEDILOL 6.25 MG PO TABS: 6.2500 mg | ORAL_TABLET | Freq: Two times a day (BID) | ORAL | Status: DC

## 2020-12-05 NOTE — Patient Instructions (Signed)
Medication Instructions: DECREASE CARVEDILOL TO 6.25 MG TWICE DAILY *If you need a refill on your cardiac medications before your next appointment, please call your pharmacy*   Lab Work: TODAY  BMET BNP DIG LIPID CBC URIC ACID AND AIC  If you have labs (blood work) drawn today and your tests are completely normal, you will receive your results only by: [place] (if you have MyChart) OR A paper copy in the mail If you have any lab test that is abnormal or we need to change your treatment, we will call you to review the results.   Testing/Procedures: Your physician has requested that you have a lexiscan myoview. For further information please visit [email or web address]. Please follow instruction sheet, as given.    Follow-Up: At [facility], you and your health needs are our priority.  As part of our continuing mission to provide you with exceptional heart care, we have created designated Provider Care Teams.  These Care Teams include your primary Cardiologist (physician) and Advanced Practice Providers (APPs -  Physician Assistants and Nurse Practitioners) who all work together to provide you with the care you need, when you need it.  We recommend signing up for the patient portal called "MyChart".  Sign up information is provided on this After Visit Summary.  MyChart is used to connect with patients for Virtual Visits (Telemedicine).  Patients are able to view lab/test results, encounter notes, upcoming appointments, etc.  Non-urgent messages can be sent to your provider as well.   To learn more about what you can do with MyChart, go to [email or web address].    Your next appointment:   6 month(s)  The format for your next appointment:   In Person  Provider:   Dorris Carnes, MD     Other Instructions NONE

## 2020-12-06 ENCOUNTER — Ambulatory Visit (INDEPENDENT_AMBULATORY_CARE_PROVIDER_SITE_OTHER): Payer: Managed Care, Other (non HMO)

## 2020-12-06 ENCOUNTER — Other Ambulatory Visit: Payer: Self-pay

## 2020-12-06 ENCOUNTER — Telehealth: Payer: Self-pay

## 2020-12-06 DIAGNOSIS — I255 Ischemic cardiomyopathy: Secondary | ICD-10-CM

## 2020-12-06 DIAGNOSIS — E790 Hyperuricemia without signs of inflammatory arthritis and tophaceous disease: Secondary | ICD-10-CM

## 2020-12-06 DIAGNOSIS — Z79899 Other long term (current) drug therapy: Secondary | ICD-10-CM

## 2020-12-06 LAB — CBC
Hematocrit: 37.8 % (ref 37.5–51.0)
Hemoglobin: 12.8 g/dL — ABNORMAL LOW (ref 13.0–17.7)
MCH: 27.7 pg (ref 26.6–33.0)
MCHC: 33.9 g/dL (ref 31.5–35.7)
MCV: 82 fL (ref 79–97)
Platelets: 260 10*3/uL (ref 150–450)
RBC: 4.62 x10E6/uL (ref 4.14–5.80)
RDW: 13.5 % (ref 11.6–15.4)
WBC: 7.2 10*3/uL (ref 3.4–10.8)

## 2020-12-06 LAB — LIPID PANEL
Chol/HDL Ratio: 5.9 ratio — ABNORMAL HIGH (ref 0.0–5.0)
Cholesterol, Total: 195 mg/dL (ref 100–199)
HDL: 33 mg/dL — ABNORMAL LOW (ref >39)
LDL Chol Calc (NIH): 98 mg/dL (ref 0–99)
Triglycerides: 380 mg/dL — ABNORMAL HIGH (ref 0–149)
VLDL Cholesterol Cal: 64 mg/dL — ABNORMAL HIGH (ref 5–40)

## 2020-12-06 LAB — DIGOXIN LEVEL: Digoxin, Serum: 0.5 ng/mL (ref 0.5–0.9)

## 2020-12-06 LAB — HEMOGLOBIN A1C
Est. average glucose Bld gHb Est-mCnc: 157 mg/dL
Hgb A1c MFr Bld: 7.1 % — ABNORMAL HIGH (ref 4.8–5.6)

## 2020-12-06 LAB — BASIC METABOLIC PANEL
BUN/Creatinine Ratio: 27 — ABNORMAL HIGH (ref 10–24)
BUN: 45 mg/dL — ABNORMAL HIGH (ref 8–27)
CO2: 22 mmol/L (ref 20–29)
Calcium: 9.7 mg/dL (ref 8.6–10.2)
Chloride: 96 mmol/L (ref 96–106)
Creatinine, Ser: 1.68 mg/dL — ABNORMAL HIGH (ref 0.76–1.27)
Glucose: 173 mg/dL — ABNORMAL HIGH (ref 70–99)
Potassium: 4.7 mmol/L (ref 3.5–5.2)
Sodium: 138 mmol/L (ref 134–144)
eGFR: 45 mL/min/{1.73_m2} — ABNORMAL LOW (ref >59)

## 2020-12-06 LAB — URIC ACID: Uric Acid: 9.7 mg/dL — ABNORMAL HIGH (ref 3.8–8.4)

## 2020-12-06 LAB — PRO B NATRIURETIC PEPTIDE: NT-Pro BNP: 451 pg/mL — ABNORMAL HIGH (ref 0–210)

## 2020-12-06 MED ORDER — ALLOPURINOL 100 MG PO TABS: 100.0000 mg | ORAL_TABLET | Freq: Every day | ORAL | 3 refills | Status: DC

## 2020-12-06 NOTE — Telephone Encounter (Signed)
Pt verbalized understanding of his lab results.. will return 12/28/20 for repeat labs.

## 2020-12-06 NOTE — Telephone Encounter (Signed)
-----   Message from Fay Records, MD sent at 12/06/2020  4:21 PM EST ----- Uric acid is high   I would try 100 mg daily allopurinol  Check BMET and uric acid in 3 wks  CBC is OK Cr is relatively stable at 1.68    Digoxin level is OK A1C is elevated at 7.1   Watch carbs  Forward to Dr Laurann Montana

## 2020-12-08 ENCOUNTER — Other Ambulatory Visit: Payer: Self-pay

## 2020-12-08 LAB — CUP PACEART REMOTE DEVICE CHECK
Battery Remaining Longevity: 41 mo
Battery Remaining Percentage: 45 %
Battery Voltage: 2.93 V
Brady Statistic AP VP Percent: 84 %
Brady Statistic AP VS Percent: 1.3 %
Brady Statistic AS VP Percent: 11 %
Brady Statistic AS VS Percent: 1 %
Brady Statistic RA Percent Paced: 72 %
Date Time Interrogation Session: 20221118013210
HighPow Impedance: 72 Ohm
HighPow Impedance: 72 Ohm
Implantable Lead Implant Date: 20130311
Implantable Lead Implant Date: 20130311
Implantable Lead Implant Date: 20130311
Implantable Lead Location: 753858
Implantable Lead Location: 753859
Implantable Lead Location: 753860
Implantable Lead Model: 7122
Implantable Pulse Generator Implant Date: 20190218
Lead Channel Impedance Value: 340 Ohm
Lead Channel Impedance Value: 350 Ohm
Lead Channel Impedance Value: 760 Ohm
Lead Channel Pacing Threshold Amplitude: 1 V
Lead Channel Pacing Threshold Amplitude: 1 V
Lead Channel Pacing Threshold Amplitude: 3.25 V
Lead Channel Pacing Threshold Pulse Width: 0.5 ms
Lead Channel Pacing Threshold Pulse Width: 0.5 ms
Lead Channel Pacing Threshold Pulse Width: 1.5 ms
Lead Channel Sensing Intrinsic Amplitude: 12 mV
Lead Channel Sensing Intrinsic Amplitude: 5 mV
Lead Channel Setting Pacing Amplitude: 0.25 V
Lead Channel Setting Pacing Amplitude: 2 V
Lead Channel Setting Pacing Amplitude: 2 V
Lead Channel Setting Pacing Pulse Width: 0.05 ms
Lead Channel Setting Pacing Pulse Width: 0.5 ms
Lead Channel Setting Sensing Sensitivity: 0.5 mV
Pulse Gen Serial Number: 9780477

## 2020-12-08 NOTE — Addendum Note (Signed)
Addended by: Fay Records on: 12/08/2020 05:01 PM   Modules accepted: Orders

## 2020-12-08 NOTE — Addendum Note (Signed)
Addended by: Stephani Police on: 12/08/2020 05:01 PM   Modules accepted: Orders

## 2020-12-11 ENCOUNTER — Telehealth (HOSPITAL_COMMUNITY): Payer: Self-pay | Admitting: *Deleted

## 2020-12-11 NOTE — Telephone Encounter (Signed)
Patient given detailed instructions per Myocardial Perfusion Study Information Sheet for the test on 12/18/20 at 10:00. Patient notified to arrive 15 minutes early and that it is imperative to arrive on time for appointment to keep from having the test rescheduled.  If you need to cancel or reschedule your appointment, please call the office within 24 hours of your appointment. . Patient verbalized understanding.Caleb Bell

## 2020-12-13 NOTE — Progress Notes (Signed)
Remote ICD transmission.   

## 2020-12-18 ENCOUNTER — Ambulatory Visit (HOSPITAL_COMMUNITY): Payer: Managed Care, Other (non HMO) | Attending: Internal Medicine

## 2020-12-18 ENCOUNTER — Other Ambulatory Visit: Payer: Self-pay

## 2020-12-18 DIAGNOSIS — Z79899 Other long term (current) drug therapy: Secondary | ICD-10-CM | POA: Insufficient documentation

## 2020-12-18 DIAGNOSIS — R0609 Other forms of dyspnea: Secondary | ICD-10-CM | POA: Diagnosis present

## 2020-12-18 LAB — MYOCARDIAL PERFUSION IMAGING
Base ST Depression (mm): 0 mm
LV dias vol: 325 mL (ref 62–150)
LV sys vol: 265 mL
Nuc Stress EF: 19 %
Peak HR: 84 {beats}/min
Rest HR: 66 {beats}/min
Rest Nuclear Isotope Dose: 7.8 mCi
SDS: 1
SRS: 30
SSS: 31
ST Depression (mm): 0 mm
Stress Nuclear Isotope Dose: 26.3 mCi
TID: 1.14

## 2020-12-18 MED ORDER — REGADENOSON 0.4 MG/5ML IV SOLN
0.4000 mg | Freq: Once | INTRAVENOUS | Status: AC
  Administered 2020-12-18: 0.4 mg via INTRAVENOUS

## 2020-12-18 MED ORDER — TECHNETIUM TC 99M TETROFOSMIN IV KIT
26.3000 | PACK | Freq: Once | INTRAVENOUS | Status: AC | PRN
  Administered 2020-12-18: 26.3 via INTRAVENOUS
  Filled 2020-12-18: qty 27

## 2020-12-18 MED ORDER — TECHNETIUM TC 99M TETROFOSMIN IV KIT
7.8000 | PACK | Freq: Once | INTRAVENOUS | Status: AC | PRN
  Administered 2020-12-18: 7.8 via INTRAVENOUS
  Filled 2020-12-18: qty 8

## 2020-12-19 ENCOUNTER — Ambulatory Visit (INDEPENDENT_AMBULATORY_CARE_PROVIDER_SITE_OTHER): Payer: Managed Care, Other (non HMO) | Admitting: Endocrinology

## 2020-12-19 VITALS — BP 100/58 | HR 104 | Ht 73.0 in | Wt 211.0 lb

## 2020-12-19 DIAGNOSIS — E1122 Type 2 diabetes mellitus with diabetic chronic kidney disease: Secondary | ICD-10-CM | POA: Diagnosis not present

## 2020-12-19 DIAGNOSIS — N1831 Chronic kidney disease, stage 3a: Secondary | ICD-10-CM

## 2020-12-19 DIAGNOSIS — Z794 Long term (current) use of insulin: Secondary | ICD-10-CM

## 2020-12-19 LAB — POCT GLYCOSYLATED HEMOGLOBIN (HGB A1C): Hemoglobin A1C: 7.1 % — AB (ref 4.0–5.6)

## 2020-12-19 MED ORDER — DEXCOM G6 TRANSMITTER MISC: 1.0000 | Freq: Once | 1 refills | Status: AC

## 2020-12-19 MED ORDER — TRULICITY 0.75 MG/0.5ML ~~LOC~~ SOAJ: 0.7500 mg | SUBCUTANEOUS | 3 refills | Status: AC

## 2020-12-19 MED ORDER — INSULIN LISPRO (1 UNIT DIAL) 100 UNIT/ML (KWIKPEN): 16.0000 [IU] | PEN_INJECTOR | SUBCUTANEOUS | 3 refills | Status: DC

## 2020-12-19 MED ORDER — DEXCOM G6 RECEIVER DEVI: 1.0000 | Freq: Once | 1 refills | Status: AC

## 2020-12-19 MED ORDER — DEXCOM G6 SENSOR MISC: 1.0000 | 3 refills | Status: AC

## 2020-12-19 NOTE — Progress Notes (Signed)
Subjective:    Patient ID: Caleb Bell, male    DOB: 05/23/1955, 65 y.o.   MRN: [id number]  HPI Pt returns for f/u of diabetes mellitus:  DM type: 1 Dx'ed: 9233 Complications: PN, stage 3 CRI, foot ulcer, DR, PAD, and CAD.   Therapy: insulin since 2010.   DKA: never.   Severe hypoglycemia: never.    Pancreatitis: never.   Other: he takes multiple daily injections; he declines pump therapy; he declines to add oral rx.   Interval history: pt states he feels well in general.  Meter is downloaded today, and the printout is scanned into the record.  cbg varies from 95-300.  There is no trend throughout the day. He seldom has hypoglycemia, and these episodes are mild.   Past Medical History:  Diagnosis Date   AICD (automatic cardioverter/defibrillator) present    Atrial fibrillation (HCC)    Paroxysmal, limited   CAD (coronary artery disease)    CABG 0076   Chronic systolic heart failure (HCC)    EF 20-25% echo 5- 2011   CRI (chronic renal insufficiency)     multifactorial... Dr. Posey Pronto.. consult September 18, 2009   DM type 2 (diabetes mellitus, type 2) ([place])    Dr Loanne Drilling   Ejection fraction < 50%    EF 25%, echo, November, 2012  //   planning followup 2-D echo to assess LV function with CRT D.  device in place   Elevated serum creatinine    Epididymal cyst 2017   right, no testis masses   Hodgkin's disease     Dx aprox 2004, s/p XRT-Chemo/had radiation too   Hyperkalemia    August, 2011, Aldactone, Aldactone stopped   Hyperlipidemia    low HDL   Hypothyroidism    ICD (implantable cardiac defibrillator) in place    CRT-D placed March, 2013   Ischemic cardiomyopathy    MRI12/12 no real viability in hypo/akinetic segment  CAth native and graft disease   IVCD (intraventricular conduction defect)    Myocardial infarction ([place])    mild MI/ several MI per pt in the past.   Personal history of colonic adenomas 12/24/2011   Polysubstance    Alcohol, cocaine- resoloved for  many years    Presence of permanent cardiac pacemaker     Past Surgical History:  Procedure Laterality Date   BI-VENTRICULAR IMPLANTABLE CARDIOVERTER DEFIBRILLATOR N/A 04/01/2011   Procedure: BI-VENTRICULAR IMPLANTABLE CARDIOVERTER DEFIBRILLATOR  (CRT-D);  Surgeon: Deboraha Sprang, MD;  Location: [facility] CATH LAB;  Service: Cardiovascular;  Laterality: N/A;   BIV ICD GENERATOR CHANGEOUT N/A 03/10/2017   Procedure: BIV ICD GENERATOR CHANGEOUT;  Surgeon: Deboraha Sprang, MD;  Location: [place] CV LAB;  Service: Cardiovascular;  Laterality: N/A;   CARDIAC DEFIBRILLATOR PLACEMENT  03/2011   CATARACT EXTRACTION     COLONOSCOPY  12/24/2011   Procedure: COLONOSCOPY;  Surgeon: [name], MD;  Location: [facility] ENDOSCOPY;  Service: Endoscopy;  Laterality: N/A;   COLONOSCOPY WITH PROPOFOL N/A 06/30/2017   Procedure: COLONOSCOPY WITH PROPOFOL;  Surgeon: [name], MD;  Location: [facility] ENDOSCOPY;  Service: Endoscopy;  Laterality: N/A;   CORONARY ARTERY BYPASS GRAFT  2003   CYSTECTOMY     Upper Back   POLYPECTOMY  06/30/2017   Procedure: POLYPECTOMY;  Surgeon: [name], MD;  Location: [facility] ENDOSCOPY;  Service: Endoscopy;;   ROBOTIC ASSITED PARTIAL NEPHRECTOMY Left 09/27/2020   Procedure: XI ROBOTIC ASSITED PARTIAL NEPHRECTOMY;  Surgeon: Alexis Frock, MD;  Location: [facility] ORS;  Service: Urology;  Laterality: Left;  3 HRS   TONSILLECTOMY     age 58    Social History   Socioeconomic History   Marital status: Married    Spouse name: Not on file   Number of children: 0   Years of education: Not on file   Highest education level: Not on file  Occupational History   Occupation: musician  Tobacco Use   Smoking status: Never   Smokeless tobacco: Never  Vaping Use   Vaping Use: Never used  Substance and Sexual Activity   Alcohol use: No    Alcohol/week: 0.0 standard drinks    Comment: Hx of abuse/clean since 2003    Drug use: No    Comment: Hx of abuse/clean for many years   Sexual  activity: Not on file  Other Topics Concern   Not on file  Social History Narrative   Musician   Single, no children, lives by himself   Alcohol use-no (hx of abuse clean since 2003)      Drug use-no (hx of abuse clean x years)     Social Determinants of Health   Financial Resource Strain: Not on file  Food Insecurity: Not on file  Transportation Needs: Not on file  Physical Activity: Not on file  Stress: Not on file  Social Connections: Not on file  Intimate Partner Violence: Not on file    Current Outpatient Medications on File Prior to Visit  Medication Sig Dispense Refill   acetaminophen (TYLENOL) 500 MG tablet Take 500-1,000 mg by mouth every 8 (eight) hours as needed for headache or moderate pain.     allopurinol (ZYLOPRIM) 100 MG tablet Take 1 tablet (100 mg total) by mouth daily. 90 tablet 3   atorvastatin (LIPITOR) 80 MG tablet Take 80 mg by mouth at bedtime.   6   BAYER MICROLET LANCETS lancets USE TO CHECK BLOOD SUGAR 2 TIMES PER DAY. 100 each 12   carvedilol (COREG) 6.25 MG tablet Take 1 tablet (6.25 mg total) by mouth 2 (two) times daily with a meal.     Cholecalciferol (VITAMIN D-3) 125 MCG (5000 UT) TABS Take 5,000 Units by mouth daily.     digoxin (LANOXIN) 0.125 MG tablet Take 1 tablet (0.125 mg total) by mouth every other day. 45 tablet 3   ELIQUIS 5 MG TABS tablet Take 5 mg by mouth 2 (two) times daily.     furosemide (LASIX) 20 MG tablet Take 20 mg by mouth daily.     glucose blood (CONTOUR NEXT TEST) test strip 1 each by Other route in the morning and at bedtime. And lancets 2/day 200 each 3   insulin detemir (LEVEMIR FLEXTOUCH) 100 UNIT/ML FlexPen Inject 6 Units into the skin at bedtime. 15 mL 11   Insulin Pen Needle (PEN NEEDLES) 32G X 4 MM MISC Use to inject  3 times a day into the skin 200 each 5   levothyroxine (SYNTHROID, LEVOTHROID) 88 MCG tablet Take 1 tablet (88 mcg total) by mouth daily before breakfast. 30 tablet 0   sacubitril-valsartan (ENTRESTO)  24-26 MG Take 1 tablet by mouth 2 (two) times daily. Please keep upcoming appt in November 2022 with Dr. Harrington Challenger before anymore refills. Thank you 60 tablet 1   spironolactone (ALDACTONE) 25 MG tablet TAKE 1/2 TABLET BY MOUTH DAILY 45 tablet 3   oxyCODONE-acetaminophen (PERCOCET) 5-325 MG tablet Take 1 tablet by mouth every 6 (six) hours as needed for severe pain or moderate pain (post-operatively). 15  tablet 0   senna-docusate (SENOKOT-S) 8.6-50 MG tablet Take 1 tablet by mouth 2 (two) times daily. While taking strong pain meds to prevent constipation 10 tablet 0   No current facility-administered medications on file prior to visit.    No Known Allergies  Family History  Adopted: Yes  Family history unknown: Yes    BP (!) 100/58   Pulse (!) 104   Ht 6\' 1"  (1.854 m)   Wt 211 lb (95.7 kg)   SpO2 98%   BMI 27.84 kg/m    Review of Systems     Objective:   Physical Exam    Lab Results  Component Value Date   HGBA1C 7.1 (A) 12/19/2020      Assessment & Plan:  Type 1 DM Hypoglycemia, due to insulin: we'll favor GLP rx  Patient Instructions  I have sent a prescription to your pharmacy, to add Trulicity, and:  Reduce the Humalog to 3 times a day (just before each meal) 01-01-29 units and:  Please continue the same Levemir.   I have also sent a prescription to your pharmacy, for the continuous glucose monitor sensors check your blood sugar twice a day.  vary the time of day when you check, between before the 3 meals, and at bedtime.  also check if you have symptoms of your blood sugar being too high or too low.  please keep a record of the readings and bring it to your next appointment here (or you can bring the meter itself).  You can write it on any piece of paper.  please call us sooner if your blood sugar goes below 70, or if you have a lot of readings over 200.   Please come back for a follow-up appointment in 4 months.

## 2020-12-19 NOTE — Patient Instructions (Signed)
I have sent a prescription to your pharmacy, to add Trulicity, and:  Reduce the Humalog to 3 times a day (just before each meal) 01-01-29 units and:  Please continue the same Levemir.   I have also sent a prescription to your pharmacy, for the continuous glucose monitor sensors check your blood sugar twice a day.  vary the time of day when you check, between before the 3 meals, and at bedtime.  also check if you have symptoms of your blood sugar being too high or too low.  please keep a record of the readings and bring it to your next appointment here (or you can bring the meter itself).  You can write it on any piece of paper.  please call us sooner if your blood sugar goes below 70, or if you have a lot of readings over 200.   Please come back for a follow-up appointment in 4 months.

## 2020-12-22 ENCOUNTER — Encounter: Payer: Self-pay | Admitting: Internal Medicine

## 2020-12-22 DIAGNOSIS — I255 Ischemic cardiomyopathy: Secondary | ICD-10-CM

## 2020-12-22 DIAGNOSIS — Z79899 Other long term (current) drug therapy: Secondary | ICD-10-CM

## 2020-12-22 NOTE — Telephone Encounter (Signed)
-----   Message from Fay Records, MD sent at 12/20/2020 12:44 PM EST ----- Myoview scan shows old area of heart muscle scar but no ischemia Pumping function may not be accurate on this scan I would recomm an echo to reevaluate LV function How is his BP now that meds pulled back

## 2020-12-22 NOTE — Telephone Encounter (Signed)
Spoke with the pt re: his Myoview and he agrees to having an Echo.

## 2020-12-24 ENCOUNTER — Other Ambulatory Visit: Payer: Self-pay | Admitting: Internal Medicine

## 2020-12-25 ENCOUNTER — Telehealth: Payer: Self-pay | Admitting: Pharmacy Technician

## 2020-12-25 ENCOUNTER — Other Ambulatory Visit (HOSPITAL_COMMUNITY): Payer: Self-pay

## 2020-12-25 NOTE — Telephone Encounter (Signed)
Patient Advocate Encounter  Prior Authorization for [employer], transmitter and Sensor has been approved.    PA# ref# [id number] (Sensor) [phone] (transmitter) [phone] (receiver) Effective dates: 12/25/20 through 12/24/21  Per Test Claim Patients co-pay is $not covered at our pharmacy. $0 copay when filled at Tier 1 pharmacy, [facility] or [facility].

## 2020-12-25 NOTE — Telephone Encounter (Signed)
Patient Advocate Encounter  Received notification from [facility]) that prior authorization for DEXCOM (RECEIVER, SENSOR, TRANSMITTER)  is required.   PA submitted on 12.5.22 RECEIVER Key [id number] TRANSMITTER Key [id number] SENSOR Key  [id number]  Status is pending   [facility] will continue to follow  Luciano Cutter, CPhT [facility] Phone: [phone] Fax:  [phone]

## 2020-12-27 ENCOUNTER — Encounter: Payer: Self-pay | Admitting: Internal Medicine

## 2020-12-28 ENCOUNTER — Other Ambulatory Visit: Payer: Self-pay

## 2020-12-28 ENCOUNTER — Other Ambulatory Visit: Payer: Managed Care, Other (non HMO)

## 2020-12-28 DIAGNOSIS — E790 Hyperuricemia without signs of inflammatory arthritis and tophaceous disease: Secondary | ICD-10-CM

## 2020-12-28 DIAGNOSIS — Z79899 Other long term (current) drug therapy: Secondary | ICD-10-CM

## 2020-12-29 LAB — BASIC METABOLIC PANEL
BUN/Creatinine Ratio: 21 (ref 10–24)
BUN: 33 mg/dL — ABNORMAL HIGH (ref 8–27)
CO2: 22 mmol/L (ref 20–29)
Calcium: 9.3 mg/dL (ref 8.6–10.2)
Chloride: 91 mmol/L — ABNORMAL LOW (ref 96–106)
Creatinine, Ser: 1.6 mg/dL — ABNORMAL HIGH (ref 0.76–1.27)
Glucose: 269 mg/dL — ABNORMAL HIGH (ref 70–99)
Potassium: 4.3 mmol/L (ref 3.5–5.2)
Sodium: 129 mmol/L — ABNORMAL LOW (ref 134–144)
eGFR: 48 mL/min/{1.73_m2} — ABNORMAL LOW (ref >59)

## 2020-12-29 LAB — URIC ACID: Uric Acid: 7.5 mg/dL (ref 3.8–8.4)

## 2021-01-10 ENCOUNTER — Other Ambulatory Visit: Payer: Self-pay

## 2021-01-10 ENCOUNTER — Ambulatory Visit (HOSPITAL_COMMUNITY): Payer: Managed Care, Other (non HMO) | Attending: Internal Medicine

## 2021-01-10 DIAGNOSIS — I255 Ischemic cardiomyopathy: Secondary | ICD-10-CM | POA: Insufficient documentation

## 2021-01-10 DIAGNOSIS — Z79899 Other long term (current) drug therapy: Secondary | ICD-10-CM | POA: Insufficient documentation

## 2021-01-10 LAB — ECHOCARDIOGRAM COMPLETE
AR max vel: 1.74 cm2
AV Area VTI: 1.78 cm2
AV Area mean vel: 1.66 cm2
AV Mean grad: 11 mmHg
AV Peak grad: 19.9 mmHg
Ao pk vel: 2.23 m/s
Area-P 1/2: 4.39 cm2
S' Lateral: 4.6 cm

## 2021-01-10 MED ORDER — PERFLUTREN LIPID MICROSPHERE
1.0000 mL | INTRAVENOUS | Status: AC | PRN
Start: 2021-01-10 — End: 2021-01-10
  Administered 2021-01-10: 1 mL via INTRAVENOUS

## 2021-02-01 ENCOUNTER — Encounter: Payer: Self-pay | Admitting: Internal Medicine

## 2021-02-05 ENCOUNTER — Other Ambulatory Visit: Payer: Self-pay | Admitting: Internal Medicine

## 2021-02-15 ENCOUNTER — Other Ambulatory Visit: Payer: Self-pay | Admitting: Internal Medicine

## 2021-02-26 ENCOUNTER — Encounter: Payer: Self-pay | Admitting: Internal Medicine

## 2021-02-26 MED ORDER — CARVEDILOL 6.25 MG PO TABS: 6.2500 mg | ORAL_TABLET | Freq: Two times a day (BID) | ORAL | 1 refills | Status: DC

## 2021-02-27 ENCOUNTER — Other Ambulatory Visit: Payer: Self-pay | Admitting: Infectious Diseases

## 2021-02-27 DIAGNOSIS — J410 Simple chronic bronchitis: Secondary | ICD-10-CM

## 2021-02-27 MED ORDER — AMOXICILLIN-POT CLAVULANATE 875-125 MG PO TABS
1.0000 | ORAL_TABLET | Freq: Two times a day (BID) | ORAL | 0 refills | Status: DC
Start: 2021-02-27 — End: 2021-08-09

## 2021-03-07 ENCOUNTER — Ambulatory Visit (INDEPENDENT_AMBULATORY_CARE_PROVIDER_SITE_OTHER): Payer: Managed Care, Other (non HMO)

## 2021-03-07 DIAGNOSIS — I255 Ischemic cardiomyopathy: Secondary | ICD-10-CM | POA: Diagnosis not present

## 2021-03-07 LAB — CUP PACEART REMOTE DEVICE CHECK
Battery Remaining Longevity: 39 mo
Battery Remaining Percentage: 42 %
Battery Voltage: 2.93 V
Brady Statistic AP VP Percent: 79 %
Brady Statistic AP VS Percent: 1.2 %
Brady Statistic AS VP Percent: 16 %
Brady Statistic AS VS Percent: 1 %
Brady Statistic RA Percent Paced: 68 %
Date Time Interrogation Session: 20230215040026
HighPow Impedance: 66 Ohm
HighPow Impedance: 66 Ohm
Implantable Lead Implant Date: 20130311
Implantable Lead Implant Date: 20130311
Implantable Lead Implant Date: 20130311
Implantable Lead Location: 753858
Implantable Lead Location: 753859
Implantable Lead Location: 753860
Implantable Lead Model: 7122
Implantable Pulse Generator Implant Date: 20190218
Lead Channel Impedance Value: 340 Ohm
Lead Channel Impedance Value: 350 Ohm
Lead Channel Impedance Value: 730 Ohm
Lead Channel Pacing Threshold Amplitude: 1 V
Lead Channel Pacing Threshold Amplitude: 1 V
Lead Channel Pacing Threshold Amplitude: 3.25 V
Lead Channel Pacing Threshold Pulse Width: 0.5 ms
Lead Channel Pacing Threshold Pulse Width: 0.5 ms
Lead Channel Pacing Threshold Pulse Width: 1.5 ms
Lead Channel Sensing Intrinsic Amplitude: 12 mV
Lead Channel Sensing Intrinsic Amplitude: 5 mV
Lead Channel Setting Pacing Amplitude: 0.25 V
Lead Channel Setting Pacing Amplitude: 2 V
Lead Channel Setting Pacing Amplitude: 2 V
Lead Channel Setting Pacing Pulse Width: 0.05 ms
Lead Channel Setting Pacing Pulse Width: 0.5 ms
Lead Channel Setting Sensing Sensitivity: 0.5 mV
Pulse Gen Serial Number: 9780477

## 2021-03-12 NOTE — Progress Notes (Signed)
Remote ICD transmission.   

## 2021-03-24 ENCOUNTER — Other Ambulatory Visit: Payer: Self-pay | Admitting: Endocrinology

## 2021-04-06 ENCOUNTER — Other Ambulatory Visit: Payer: Self-pay

## 2021-04-06 MED ORDER — SPIRONOLACTONE 25 MG PO TABS: 12.5000 mg | ORAL_TABLET | Freq: Every day | ORAL | 0 refills | Status: DC

## 2021-04-09 ENCOUNTER — Other Ambulatory Visit: Payer: Self-pay | Admitting: Endocrinology

## 2021-04-09 ENCOUNTER — Telehealth: Payer: Self-pay

## 2021-04-09 MED ORDER — LEVEMIR FLEXPEN 100 UNIT/ML ~~LOC~~ SOPN: 6.0000 [IU] | PEN_INJECTOR | Freq: Every day | SUBCUTANEOUS | 3 refills | Status: AC

## 2021-04-09 NOTE — Telephone Encounter (Signed)
Patient's pharmacy says that Levemir flextouch is no longer being manufactured at the moment but Levemir flexpens are. Would like to know if a new RX for Levemir Flexpens can be sent in with same directions as 6 units at bedtime. Please advise/authorize the change ?

## 2021-04-19 ENCOUNTER — Encounter: Payer: Self-pay | Admitting: Endocrinology

## 2021-04-19 ENCOUNTER — Ambulatory Visit: Payer: Managed Care, Other (non HMO) | Admitting: Endocrinology

## 2021-04-19 VITALS — BP 120/60 | HR 78 | Ht 73.0 in | Wt 205.4 lb

## 2021-04-19 DIAGNOSIS — Z794 Long term (current) use of insulin: Secondary | ICD-10-CM | POA: Diagnosis not present

## 2021-04-19 DIAGNOSIS — E1122 Type 2 diabetes mellitus with diabetic chronic kidney disease: Secondary | ICD-10-CM | POA: Diagnosis not present

## 2021-04-19 DIAGNOSIS — N1831 Chronic kidney disease, stage 3a: Secondary | ICD-10-CM | POA: Diagnosis not present

## 2021-04-19 LAB — POCT GLYCOSYLATED HEMOGLOBIN (HGB A1C): Hemoglobin A1C: 6.7 % — AB (ref 4.0–5.6)

## 2021-04-19 MED ORDER — INSULIN LISPRO (1 UNIT DIAL) 100 UNIT/ML (KWIKPEN): PEN_INJECTOR | SUBCUTANEOUS | 3 refills | Status: AC

## 2021-04-19 MED ORDER — CONTOUR NEXT TEST VI STRP: 1.0000 | ORAL_STRIP | Freq: Three times a day (TID) | 3 refills | Status: AC

## 2021-04-19 NOTE — Progress Notes (Signed)
? ?Subjective:  ? ? Patient ID: Caleb Bell, male    DOB: 12/30/1955, 66 y.o.   MRN: [id number] ? ?HPI ?Pt returns for f/u of diabetes mellitus:  ?DM type: 1 ?Dx'ed: 2003 ?Complications: PN, stage 3 CRI, foot ulcer, DR, PAD, and CAD.   ?Therapy: insulin since 2010.   ?DKA: never.   ?Severe hypoglycemia: never.    ?Pancreatitis: never.   ?Other: he takes multiple daily injections; he declines pump therapy; he declines to add oral rx.   ?Interval history: pt states he feels well in general.  Meter is downloaded today, and the printout is scanned into the record.  cbg varies from 57-249.  He has hypoglycemia approx 1/month.  This happens after the evening meal.   He has an upcoming gap in his insurance.  He declines CGM ?Past Medical History:  ?Diagnosis Date  ? AICD (automatic cardioverter/defibrillator) present   ? Atrial fibrillation ([place])   ? Paroxysmal, limited  ? CAD (coronary artery disease)   ? CABG 2003  ? Chronic systolic heart failure ([place])   ? EF 20-25% echo 5- 2011  ? CRI (chronic renal insufficiency)   ?  multifactorial... Dr. Posey Pronto.. consult September 18, 2009  ? DM type 2 (diabetes mellitus, type 2) ([place])   ? Dr Loanne Drilling  ? Ejection fraction < 50%   ? EF 25%, echo, November, 2012  //   planning followup 2-D echo to assess LV function with CRT D.  device in place  ? Elevated serum creatinine   ? Epididymal cyst 2017  ? right, no testis masses  ? Hodgkin's disease   ?  Dx aprox 2004, s/p XRT-Chemo/had radiation too  ? Hyperkalemia   ? August, 2011, Aldactone, Aldactone stopped  ? Hyperlipidemia   ? low HDL  ? Hypothyroidism   ? ICD (implantable cardiac defibrillator) in place   ? CRT-D placed March, 2013  ? Ischemic cardiomyopathy   ? MRI12/12 no real viability in hypo/akinetic segment  CAth native and graft disease  ? IVCD (intraventricular conduction defect)   ? Myocardial infarction [facility])   ? mild MI/ several MI per pt in the past.  ? Personal history of colonic adenomas 12/24/2011  ? Polysubstance    ? Alcohol, cocaine- resoloved for many years   ? Presence of permanent cardiac pacemaker   ? ? ?Past Surgical History:  ?Procedure Laterality Date  ? BI-VENTRICULAR IMPLANTABLE CARDIOVERTER DEFIBRILLATOR N/A 04/01/2011  ? Procedure: BI-VENTRICULAR IMPLANTABLE CARDIOVERTER DEFIBRILLATOR  (CRT-D);  Surgeon: Deboraha Sprang, MD;  Location[facility] CATH LAB;  Service: Cardiovascular;  Laterality: N/A;  ? BIV ICD GENERATOR CHANGEOUT N/A 03/10/2017  ? Procedure: BIV ICD GENERATOR CHANGEOUT;  Surgeon: Deboraha Sprang, MD;  Location: [place] CV LAB;  Service: Cardiovascular;  Laterality: N/A;  ? CARDIAC DEFIBRILLATOR PLACEMENT  03/2011  ? CATARACT EXTRACTION    ? COLONOSCOPY  12/24/2011  ? Procedure: COLONOSCOPY;  Surgeon: [name], MD;  Location: [facility];  Service: Endoscopy;  Laterality: N/A;  ? COLONOSCOPY WITH PROPOFOL N/A 06/30/2017  ? Procedure: COLONOSCOPY WITH PROPOFOL;  Surgeon: [name], MD;  Location: [facility]OS[facility];  Service: Endoscopy;  Laterality: N/A;  ? CORONARY ARTERY BYPASS GRAFT  2003  ? CYSTECTOMY    ? Upper Back  ? POLYPECTOMY  06/30/2017  ? Procedure: POLYPECTOMY;  Surgeon: [name], MD;  Location: [facility];  Service: Endoscopy;;  ? ROBOTIC ASSITED PARTIAL NEPHRECTOMY Left 09/27/2020  ? Procedure: XI ROBOTIC ASSITED PARTIAL NEPHRECTOMY;  Surgeon: Alexis Frock, MD;  Location: [facility];  Service: Urology;  Laterality: Left;  3 HRS  ? TONSILLECTOMY    ? age 51  ? ? ?Social History  ? ?Socioeconomic History  ? Marital status: Married  ?  Spouse name: Not on file  ? Number of children: 0  ? Years of education: Not on file  ? Highest education level: Not on file  ?Occupational History  ? Occupation: musician  ?Tobacco Use  ? Smoking status: Never  ? Smokeless tobacco: Never  ?Vaping Use  ? Vaping Use: Never used  ?Substance and Sexual Activity  ? Alcohol use: No  ?  Alcohol/week: 0.0 standard drinks  ?  Comment: Hx of abuse/clean since 2003   ? Drug use: No  ?  Comment: Hx of  abuse/clean for many years  ? Sexual activity: Not on file  ?Other Topics Concern  ? Not on file  ?Social History Narrative  ? Musician  ? Single, no children, lives by himself  ? Alcohol use-no (hx of abuse clean since 2003)     ? Drug use-no (hx of abuse clean x years)    ? ?Social Determinants of Health  ? ?Financial Resource Strain: Not on file  ?Food Insecurity: Not on file  ?Transportation Needs: Not on file  ?Physical Activity: Not on file  ?Stress: Not on file  ?Social Connections: Not on file  ?Intimate Partner Violence: Not on file  ? ? ?Current Outpatient Medications on File Prior to Visit  ?Medication Sig Dispense Refill  ? acetaminophen (TYLENOL) 500 MG tablet Take 500-1,000 mg by mouth every 8 (eight) hours as needed for headache or moderate pain.    ? allopurinol (ZYLOPRIM) 100 MG tablet Take 1 tablet (100 mg total) by mouth daily. (Patient taking differently: Take 100 mg by mouth 2 (two) times daily.) 90 tablet 3  ? amoxicillin-clavulanate (AUGMENTIN) 875-125 MG tablet Take 1 tablet by mouth 2 (two) times daily. 14 tablet 0  ? atorvastatin (LIPITOR) 80 MG tablet Take 80 mg by mouth at bedtime.   6  ? carvedilol (COREG) 6.25 MG tablet Take 1 tablet (6.25 mg total) by mouth 2 (two) times daily with a meal. 180 tablet 1  ? Cholecalciferol (VITAMIN D-3) 125 MCG (5000 UT) TABS Take 5,000 Units by mouth daily.    ? Continuous Blood Gluc Sensor (DEXCOM G6 SENSOR) MISC 1 Device by Does not apply route See admin instructions. Change every 10 days 9 each 3  ? digoxin (LANOXIN) 0.125 MG tablet Take 1 tablet (0.125 mg total) by mouth every other day. 45 tablet 3  ? Dulaglutide (TRULICITY) 3.97 QB/3.4LP SOPN Inject 0.75 mg into the skin once a week. 6 mL 3  ? ELIQUIS 5 MG TABS tablet Take 5 mg by mouth 2 (two) times daily.    ? ENTRESTO 24-26 MG TAKE 1 TABLET BY MOUTH 2 TIMES DAILY. PLEASE KEEP UPCOMING APPOINTMENT IN NOVEMBER 2022 WITH*DR. ROSS BEFORE ANYMORE REFILLS 60 tablet 5  ? furosemide (LASIX) 20 MG  tablet Take 20 mg by mouth daily.    ? insulin detemir (LEVEMIR FLEXPEN) 100 UNIT/ML FlexPen Inject 6 Units into the skin at bedtime. 15 mL 3  ? Insulin Pen Needle (PEN NEEDLES) 32G X 4 MM MISC Use to inject  3 times a day into the skin 200 each 5  ? levothyroxine (SYNTHROID, LEVOTHROID) 88 MCG tablet Take 1 tablet (88 mcg total) by mouth daily before breakfast. (Patient taking differently: Take 100 mcg by  mouth daily before breakfast.) 30 tablet 0  ? Microlet Lancets MISC CHECK BLOOD SUGAR EACH MORNING AND AT BEDTIME 200 each 3  ? spironolactone (ALDACTONE) 25 MG tablet Take 0.5 tablets (12.5 mg total) by mouth daily. Please make overdue appt with Dr. Caryl Comes before anymore refills. Thank you 1st attempt 15 tablet 0  ? ?No current facility-administered medications on file prior to visit.  ? ? ?No Known Allergies ? ?Family History  ?Adopted: Yes  ?Family history unknown: Yes  ? ? ?BP 120/60 (BP Location: Left Arm, Patient Position: Sitting, Cuff Size: Normal)   Pulse 78   Ht '6\' 1"'$  (1.854 m)   Wt 205 lb 6.4 oz (93.2 kg)   SpO2 98%   BMI 27.10 kg/m?  ? ? ?Review of Systems ?Denies N/HB.   ?   ?Objective:  ? Physical Exam ?VITAL SIGNS:  See vs page.  ?GENERAL: no distress.   ? ? ?A1c=6.7% ?   ?Assessment & Plan:  ?Type 1 DM: overcontrolled ?Hypoglycemia, due to insulin.  I advised him to increase Trulicity, but he declines.   ? ?Patient Instructions  ?Please reduce the Humalog to 3 times a day (just before each meal) 01-01-25 units and:  ?Please continue the same Levemir.   ?check your blood sugar 3 times a day.  vary the time of day when you check, between before the 3 meals, and at bedtime.  also check if you have symptoms of your blood sugar being too high or too low.  please keep a record of the readings and bring it to your next appointment here (or you can bring the meter itself).  You can write it on any piece of paper.  please call us sooner if your blood sugar goes below 70, or if you have a lot of readings  over 200.   ?Please come back for a follow-up appointment in 4 months.   ? ? ?

## 2021-04-19 NOTE — Patient Instructions (Addendum)
Please reduce the Humalog to 3 times a day (just before each meal) 01-01-25 units and:  ?Please continue the same Levemir.   ?check your blood sugar 3 times a day.  vary the time of day when you check, between before the 3 meals, and at bedtime.  also check if you have symptoms of your blood sugar being too high or too low.  please keep a record of the readings and bring it to your next appointment here (or you can bring the meter itself).  You can write it on any piece of paper.  please call us sooner if your blood sugar goes below 70, or if you have a lot of readings over 200.   ?Please come back for a follow-up appointment in 4 months.   ?

## 2021-04-27 ENCOUNTER — Telehealth: Payer: Self-pay | Admitting: Physician Assistant

## 2021-04-27 NOTE — Telephone Encounter (Signed)
Mr. Paluch paged after our answering service concerned of intermittent bradycardia he has noticed on his blood pressure cuff and Apple Watch.  He denies any recent dizziness, blurred vision or feeling of passing out.  He has significant history of CAD s/p CABG and ischemic cardiomyopathy s/p ICD.  I am not sure if the pacing capability of his ICD is turned off or not, but at least based on the previous EKG obtained in January 2022, EKG still shows a paced rhythm.  Therefore, I am not sure why his Apple Watch would suggest bradycardia.  Since he is asymptomatic and his current heart rate in the 80s, I recommended he continue on the current medication including carvedilol.  It is possible that he has Apple Watch and blood pressure cuff was not counting the PVCs.  The next time his Apple watch or heart monitor shows bradycardia, he has been advised to get a EKG strip on his Apple Watch and send it to his cardiologist to review.  Since he is asymptomatic, I do not think there is urgent need for ED evaluation or heart monitor. ?

## 2021-06-06 ENCOUNTER — Ambulatory Visit (INDEPENDENT_AMBULATORY_CARE_PROVIDER_SITE_OTHER): Payer: Managed Care, Other (non HMO)

## 2021-06-06 DIAGNOSIS — I255 Ischemic cardiomyopathy: Secondary | ICD-10-CM

## 2021-06-06 DIAGNOSIS — I5022 Chronic systolic (congestive) heart failure: Secondary | ICD-10-CM

## 2021-06-07 ENCOUNTER — Other Ambulatory Visit: Payer: Self-pay

## 2021-06-07 LAB — CUP PACEART REMOTE DEVICE CHECK
Battery Remaining Longevity: 36 mo
Battery Remaining Percentage: 39 %
Battery Voltage: 2.92 V
Brady Statistic AP VP Percent: 75 %
Brady Statistic AP VS Percent: 1.3 %
Brady Statistic AS VP Percent: 20 %
Brady Statistic AS VS Percent: 1 %
Brady Statistic RA Percent Paced: 65 %
Date Time Interrogation Session: 20230517040018
HighPow Impedance: 66 Ohm
HighPow Impedance: 66 Ohm
Implantable Lead Implant Date: 20130311
Implantable Lead Implant Date: 20130311
Implantable Lead Implant Date: 20130311
Implantable Lead Location: 753858
Implantable Lead Location: 753859
Implantable Lead Location: 753860
Implantable Lead Model: 7122
Implantable Pulse Generator Implant Date: 20190218
Lead Channel Impedance Value: 340 Ohm
Lead Channel Impedance Value: 350 Ohm
Lead Channel Impedance Value: 730 Ohm
Lead Channel Pacing Threshold Amplitude: 1 V
Lead Channel Pacing Threshold Amplitude: 1.125 V
Lead Channel Pacing Threshold Amplitude: 3.25 V
Lead Channel Pacing Threshold Pulse Width: 0.5 ms
Lead Channel Pacing Threshold Pulse Width: 0.5 ms
Lead Channel Pacing Threshold Pulse Width: 1.5 ms
Lead Channel Sensing Intrinsic Amplitude: 11.9 mV
Lead Channel Sensing Intrinsic Amplitude: 5 mV
Lead Channel Setting Pacing Amplitude: 0.25 V
Lead Channel Setting Pacing Amplitude: 2 V
Lead Channel Setting Pacing Amplitude: 2.125
Lead Channel Setting Pacing Pulse Width: 0.05 ms
Lead Channel Setting Pacing Pulse Width: 0.5 ms
Lead Channel Setting Sensing Sensitivity: 0.5 mV
Pulse Gen Serial Number: 9780477

## 2021-06-07 MED ORDER — SPIRONOLACTONE 25 MG PO TABS: 12.5000 mg | ORAL_TABLET | Freq: Every day | ORAL | 0 refills | Status: DC

## 2021-06-11 ENCOUNTER — Other Ambulatory Visit: Payer: Self-pay | Admitting: *Deleted

## 2021-06-11 MED ORDER — SPIRONOLACTONE 25 MG PO TABS
12.5000 mg | ORAL_TABLET | Freq: Every day | ORAL | 0 refills | Status: DC
Start: 2021-06-11 — End: 2021-06-19

## 2021-06-19 ENCOUNTER — Telehealth: Payer: Self-pay | Admitting: Internal Medicine

## 2021-06-19 MED ORDER — SPIRONOLACTONE 25 MG PO TABS: 12.5000 mg | ORAL_TABLET | Freq: Every day | ORAL | 1 refills | Status: DC

## 2021-06-19 NOTE — Telephone Encounter (Signed)
*  STAT* If patient is at the pharmacy, call can be transferred to refill team.   1. Which medications need to be refilled? (please list name of each medication and dose if known) spironolactone (ALDACTONE) 25 MG tablet  2. Which pharmacy/location (including street and city if local pharmacy) is medication to be sent to? [place]  3. Do they need a 30 day or 90 day supply? 30 day

## 2021-06-19 NOTE — Telephone Encounter (Signed)
Pt's medication was sent to pt's pharmacy as requested. Confirmation received.  °

## 2021-06-19 NOTE — Progress Notes (Signed)
Remote ICD transmission.   

## 2021-06-21 ENCOUNTER — Telehealth: Payer: Self-pay | Admitting: Pharmacy Technician

## 2021-06-21 ENCOUNTER — Other Ambulatory Visit (HOSPITAL_COMMUNITY): Payer: Self-pay

## 2021-06-21 NOTE — Telephone Encounter (Addendum)
Patient Advocate Encounter   Received notification from CoverMyMeds that prior authorization for Trulicity 0.'75mg'$ /0.54m is required by his/her insurance [employer].   PA submitted on 06/21/21 Key [id number] PA Case ID: [id number] is pending    [facility]  Patient Advocate Fax:  [phone]

## 2021-06-22 ENCOUNTER — Encounter: Payer: Self-pay | Admitting: Internal Medicine

## 2021-06-22 DIAGNOSIS — I480 Paroxysmal atrial fibrillation: Secondary | ICD-10-CM

## 2021-06-22 MED ORDER — ENTRESTO 24-26 MG PO TABS: ORAL_TABLET | ORAL | 1 refills | Status: DC

## 2021-06-22 MED ORDER — ELIQUIS 5 MG PO TABS: 5.0000 mg | ORAL_TABLET | Freq: Two times a day (BID) | ORAL | 5 refills | Status: DC

## 2021-06-22 MED ORDER — CARVEDILOL 6.25 MG PO TABS: 6.2500 mg | ORAL_TABLET | Freq: Two times a day (BID) | ORAL | 1 refills | Status: DC

## 2021-06-25 NOTE — Telephone Encounter (Signed)
Patient Advocate Encounter  Prior Authorization for Trulicity 0.'75mg'$ /dose has been approved.    PA# PA Case ID: [id number]  Effective dates: 06/21/21 through 06/22/22  Filled 06/21/21

## 2021-07-02 ENCOUNTER — Other Ambulatory Visit: Payer: Self-pay

## 2021-07-02 DIAGNOSIS — I480 Paroxysmal atrial fibrillation: Secondary | ICD-10-CM

## 2021-07-02 MED ORDER — ELIQUIS 5 MG PO TABS: 5.0000 mg | ORAL_TABLET | Freq: Two times a day (BID) | ORAL | 1 refills | Status: DC

## 2021-07-02 NOTE — Telephone Encounter (Signed)
Prescription refill request for Eliquis received. Indication: Afib  Last office visit:12/05/20 Harrington Challenger)  Scr: 1.60 (12/28/20)  Age: 66 Weight: 93.2kg  Appropriate dose and refill sent to requested pharmacy.

## 2021-07-11 ENCOUNTER — Encounter: Payer: Self-pay | Admitting: Infectious Diseases

## 2021-07-27 ENCOUNTER — Encounter: Payer: Self-pay | Admitting: Internal Medicine

## 2021-07-27 ENCOUNTER — Telehealth: Payer: Self-pay

## 2021-07-27 NOTE — Telephone Encounter (Signed)
Spoke with the patient in regards to scheduling a follow up visit with another provider here at the practice, says that he is working on finding a new practice due to change in his insurance. Will contact us if he decides to stay within the practice.

## 2021-08-06 ENCOUNTER — Other Ambulatory Visit: Payer: Self-pay | Admitting: Internal Medicine

## 2021-08-09 ENCOUNTER — Ambulatory Visit (INDEPENDENT_AMBULATORY_CARE_PROVIDER_SITE_OTHER): Payer: PRIVATE HEALTH INSURANCE | Admitting: Internal Medicine

## 2021-08-09 ENCOUNTER — Encounter: Payer: Self-pay | Admitting: Internal Medicine

## 2021-08-09 VITALS — BP 100/66 | HR 67 | Ht 73.0 in | Wt 205.0 lb

## 2021-08-09 DIAGNOSIS — Z95810 Presence of automatic (implantable) cardiac defibrillator: Secondary | ICD-10-CM

## 2021-08-09 DIAGNOSIS — I255 Ischemic cardiomyopathy: Secondary | ICD-10-CM | POA: Diagnosis not present

## 2021-08-09 DIAGNOSIS — I480 Paroxysmal atrial fibrillation: Secondary | ICD-10-CM | POA: Diagnosis not present

## 2021-08-09 DIAGNOSIS — I5022 Chronic systolic (congestive) heart failure: Secondary | ICD-10-CM | POA: Diagnosis not present

## 2021-08-09 NOTE — Progress Notes (Signed)
Patient Care Team: Lavone Orn, MD as PCP - General (Internal Medicine) Fay Records, MD as PCP - Cardiology (Cardiology) Elmarie Shiley, MD (Nephrology) Deboraha Sprang, MD (Cardiology) Renato Shin, MD (Inactive) as Consulting Physician (Endocrinology)   HPI  Caleb Bell is a 66 y.o. male Seen in follow-up for ischemic cardiomyopathy with prior bypass surgery and an IVCD for which he underwent CRT-D 3/13 with generator change 2/19.    Recurrent atrial arrhythmias including atrial fibrillation for which we discussed catheter ablation telehealth 2/22   There have been change in RV pacing threshold which would raise the issue as to whether an RV lead revision will be necessary; at time of revision it was elected not to; 1/22 there is an issue with RV pacing thresholds.     Date RV threshold  2016 1.25/0.8  2/19 2.0/0.8  5/19 1.875/0.8  11/20 3V/0.8  7/23   Non capture   The patient denies chest pain, shortness of breath, nocturnal dyspnea, orthopnea or peripheral edema.  There have been no palpitations, lightheadedness or syncope.    Remote history also notable for Hodgkin's disease and PAF.    DATE TEST EF    10/13 Echo  20-25 %    12/15 Echo   20.25 %    2/19 Echo  30-35%    12/22 Echo  25-30%     Date Cr K Dig Hgb  2/19 1.39 4.7  12.9   9/20 1.53 4.7 0.8 12.7  6/21 1.7     12/22 1.6 4.3  0.5(11/22) 12.8             Past Medical History:  Diagnosis Date   AICD (automatic cardioverter/defibrillator) present    Atrial fibrillation (HCC)    Paroxysmal, limited   CAD (coronary artery disease)    CABG 4401   Chronic systolic heart failure (HCC)    EF 20-25% echo 5- 2011   CRI (chronic renal insufficiency)     multifactorial... Dr. Posey Pronto.. consult September 18, 2009   DM type 2 (diabetes mellitus, type 2) ([place])    Dr Loanne Drilling   Ejection fraction < 50%    EF 25%, echo, November, 2012  //   planning followup 2-D echo to assess LV function with CRT D.   device in place   Elevated serum creatinine    Epididymal cyst 2017   right, no testis masses   Hodgkin's disease     Dx aprox 2004, s/p XRT-Chemo/had radiation too   Hyperkalemia    August, 2011, Aldactone, Aldactone stopped   Hyperlipidemia    low HDL   Hypothyroidism    ICD (implantable cardiac defibrillator) in place    CRT-D placed March, 2013   Ischemic cardiomyopathy    MRI12/12 no real viability in hypo/akinetic segment  CAth native and graft disease   IVCD (intraventricular conduction defect)    Myocardial infarction ([place])    mild MI/ several MI per pt in the past.   Personal history of colonic adenomas 12/24/2011   Polysubstance    Alcohol, cocaine- resoloved for many years    Presence of permanent cardiac pacemaker     Past Surgical History:  Procedure Laterality Date   BI-VENTRICULAR IMPLANTABLE CARDIOVERTER DEFIBRILLATOR N/A 04/01/2011   Procedure: BI-VENTRICULAR IMPLANTABLE CARDIOVERTER DEFIBRILLATOR  (CRT-D);  Surgeon: Deboraha Sprang, MD;  Location: [facility] CATH LAB;  Service: Cardiovascular;  Laterality: N/A;   BIV ICD GENERATOR CHANGEOUT N/A 03/10/2017   Procedure: BIV ICD GENERATOR CHANGEOUT;  Surgeon: Deboraha Sprang, MD;  Location: [facility] CV LAB;  Service: Cardiovascular;  Laterality: N/A;   CARDIAC DEFIBRILLATOR PLACEMENT  03/2011   CATARACT EXTRACTION     COLONOSCOPY  12/24/2011   Procedure: COLONOSCOPY;  Surgeon: [name], MD;  Location: [facility] ENDOSCOPY;  Service: Endoscopy;  Laterality: N/A;   COLONOSCOPY WITH PROPOFOL N/A 06/30/2017   Procedure: COLONOSCOPY WITH PROPOFOL;  Surgeon: [name], MD;  Location: [facility] ENDOSCOPY;  Service: Endoscopy;  Laterality: N/A;   CORONARY ARTERY BYPASS GRAFT  2003   CYSTECTOMY     Upper Back   POLYPECTOMY  06/30/2017   Procedure: POLYPECTOMY;  Surgeon: [name], MD;  Location: [facility] ENDOSCOPY;  Service: Endoscopy;;   ROBOTIC ASSITED PARTIAL NEPHRECTOMY Left 09/27/2020   Procedure: XI ROBOTIC ASSITED PARTIAL  NEPHRECTOMY;  Surgeon: Alexis Frock, MD;  Location: [facility] ORS;  Service: Urology;  Laterality: Left;  3 HRS   TONSILLECTOMY     age 67    Current Meds  Medication Sig   acetaminophen (TYLENOL) 500 MG tablet Take 500-1,000 mg by mouth every 8 (eight) hours as needed for headache or moderate pain.   allopurinol (ZYLOPRIM) 100 MG tablet Take 100 mg by mouth 2 (two) times daily.   atorvastatin (LIPITOR) 80 MG tablet Take 80 mg by mouth at bedtime.    carvedilol (COREG) 6.25 MG tablet Take 1 tablet (6.25 mg total) by mouth 2 (two) times daily with a meal.   Cholecalciferol (VITAMIN D-3) 125 MCG (5000 UT) TABS Take 5,000 Units by mouth daily.   Continuous Blood Gluc Sensor (DEXCOM G6 SENSOR) MISC 1 Device by Does not apply route See admin instructions. Change every 10 days   digoxin (LANOXIN) 0.125 MG tablet Take 1 tablet (0.125 mg total) by mouth every other day.   Dulaglutide (TRULICITY) 2.33 AQ/7.6AU SOPN Inject 0.75 mg into the skin once a week.   ELIQUIS 5 MG TABS tablet Take 1 tablet (5 mg total) by mouth 2 (two) times daily.   furosemide (LASIX) 20 MG tablet Take 20 mg by mouth daily.   glucose blood (CONTOUR NEXT TEST) test strip 1 each by Other route 3 (three) times daily. And lancets 3/day   insulin detemir (LEVEMIR FLEXPEN) 100 UNIT/ML FlexPen Inject 6 Units into the skin at bedtime.   insulin lispro (HUMALOG KWIKPEN) 100 UNIT/ML KwikPen 12 units with breakfast, 12 units with lunch, and 26 units with dinner.   Insulin Pen Needle (PEN NEEDLES) 32G X 4 MM MISC Use to inject  3 times a day into the skin   levothyroxine (SYNTHROID, LEVOTHROID) 88 MCG tablet Take 1 tablet (88 mcg total) by mouth daily before breakfast. (Patient taking differently: Take 100 mcg by mouth daily before breakfast.)   Microlet Lancets MISC CHECK BLOOD SUGAR EACH MORNING AND AT BEDTIME   sacubitril-valsartan (ENTRESTO) 24-26 MG TAKE 1 TABLET BY MOUTH 2 TIMES DAILY.   spironolactone (ALDACTONE) 25 MG tablet Take a 1/2  tablet (12.5 mg total) by mouth daily. **Patient MUST keep upcoming appointment with Dr. Caryl Comes in July 2023 before anymore refills. Thank you 3RD AND FINAL ATTEMPT.**    No Known Allergies    Review of Systems negative except from HPI and PMH  Physical Exam BP 100/66   Pulse 67   Ht '6\' 1"'$  (1.854 m)   Wt 205 lb (93 kg)   SpO2 96%   BMI 27.05 kg/m  Well developed and well nourished in no acute distress HENT normal Neck supple with  JVP-flat Clear Device pocket well healed; without hematoma or erythema.  There is no tethering  Regular rate and rhythm, no  gallop No / murmur Abd-soft with active BS No Clubbing cyanosis   edema Skin-warm and dry A & Oriented  Grossly normal sensory and motor function  ECG sinus with P synchronous pacing with a Rs in lead V1 and a as much as suffices in lead I   assessment and  Plan  Atrial fibrillation paroxysmal 7.3 %    Ischemic cardiomyopathy   Congestive heart failure-chronic systolic   CRT-D.-[employer]    VT/PVCs  Thromboembolic risk factors   diabetes hypertension vascular idisease and CHF    CHADS-VASc score of greater than or equal to 4  RV lead--elevated threshold now programmed subthreshold    Atrial fibrillation remains relatively asymptomatic, burden is down to about 5.9%.  For now we will continue to follow.  RV lead has failed we will be reprogrammed subthreshold.  We discussed the relative importance of biventricular versus LV only pacing.  There is good data to support this the latter, at this point we will not consider lead revision.  Continue anticoagulation with apixaban.  No bleeding.  Currently on Entresto, carvedilol and spironolactone for cardiomyopathy.  I would like to start him on an SGLT2 but will need to do this in conjunction with his internist; both his internist and his diabetologist have recently retired.    Current medicines are reviewed at length with the patient today .  The patient does not  have  concerns regarding medicines.

## 2021-08-09 NOTE — Patient Instructions (Signed)

## 2021-09-05 ENCOUNTER — Ambulatory Visit (INDEPENDENT_AMBULATORY_CARE_PROVIDER_SITE_OTHER): Payer: Managed Care, Other (non HMO)

## 2021-09-05 DIAGNOSIS — I255 Ischemic cardiomyopathy: Secondary | ICD-10-CM

## 2021-09-05 LAB — CUP PACEART REMOTE DEVICE CHECK
Battery Remaining Longevity: 32 mo
Battery Remaining Percentage: 36 %
Battery Voltage: 2.92 V
Brady Statistic AP VP Percent: 57 %
Brady Statistic AP VS Percent: 1.6 %
Brady Statistic AS VP Percent: 37 %
Brady Statistic AS VS Percent: 1 %
Brady Statistic RA Percent Paced: 51 %
Date Time Interrogation Session: 20230816040548
HighPow Impedance: 70 Ohm
HighPow Impedance: 70 Ohm
Implantable Lead Implant Date: 20130311
Implantable Lead Implant Date: 20130311
Implantable Lead Implant Date: 20130311
Implantable Lead Location: 753858
Implantable Lead Location: 753859
Implantable Lead Location: 753860
Implantable Lead Model: 7122
Implantable Pulse Generator Implant Date: 20190218
Lead Channel Impedance Value: 340 Ohm
Lead Channel Impedance Value: 360 Ohm
Lead Channel Impedance Value: 790 Ohm
Lead Channel Pacing Threshold Amplitude: 0.875 V
Lead Channel Pacing Threshold Amplitude: 1.25 V
Lead Channel Pacing Threshold Amplitude: 5 V
Lead Channel Pacing Threshold Pulse Width: 0.05 ms
Lead Channel Pacing Threshold Pulse Width: 0.5 ms
Lead Channel Pacing Threshold Pulse Width: 0.5 ms
Lead Channel Sensing Intrinsic Amplitude: 12 mV
Lead Channel Sensing Intrinsic Amplitude: 5 mV
Lead Channel Setting Pacing Amplitude: 0.25 V
Lead Channel Setting Pacing Amplitude: 1.875
Lead Channel Setting Pacing Amplitude: 2.25 V
Lead Channel Setting Pacing Pulse Width: 0.05 ms
Lead Channel Setting Pacing Pulse Width: 0.5 ms
Lead Channel Setting Sensing Sensitivity: 0.5 mV
Pulse Gen Serial Number: 9780477

## 2021-09-17 ENCOUNTER — Ambulatory Visit: Payer: Self-pay

## 2021-09-17 ENCOUNTER — Encounter: Payer: Self-pay | Admitting: Orthopedic Surgery

## 2021-09-17 ENCOUNTER — Ambulatory Visit (INDEPENDENT_AMBULATORY_CARE_PROVIDER_SITE_OTHER): Payer: PRIVATE HEALTH INSURANCE | Admitting: Orthopedic Surgery

## 2021-09-17 DIAGNOSIS — I739 Peripheral vascular disease, unspecified: Secondary | ICD-10-CM | POA: Diagnosis not present

## 2021-09-17 DIAGNOSIS — L97521 Non-pressure chronic ulcer of other part of left foot limited to breakdown of skin: Secondary | ICD-10-CM

## 2021-09-17 NOTE — Progress Notes (Signed)
Office Visit Note   Patient: Caleb Bell           Date of Birth: September 05, 1955           MRN: [id number] Visit Date: 09/17/2021              Requested by: Lavone Orn, MD [address] [place],  [place] [address] PCP: Lavone Orn, MD  Chief Complaint  Patient presents with   Left Foot - Wound Check      HPI: Patient is a 66 year old gentleman who is seen for initial evaluation with diabetic neuropathy and ulcer on the plantar aspect left foot second toe.  Patient is currently on Augmentin.  Assessment & Plan: Visit Diagnoses:  1. Non-pressure chronic ulcer of other part of left foot limited to breakdown of skin ([place])   2. PVD (peripheral vascular disease) ([place])     Plan: We will set up ABIs to further evaluate his vascular status.  Patient may need vascular intervention.  We will follow-up in 4 weeks to review the labs and determine our next steps.  Follow-Up Instructions: Return in about 4 weeks (around 10/15/2021).   Ortho Exam  Patient is alert, oriented, no adenopathy, well-dressed, normal affect, normal respiratory effort. Patient states he has neuropathic pain in the foot but no history of claudication.  I cannot palpate a dorsalis pedis or posterior tibial pulse.  The Doppler was used and he has a dampened monophasic dorsalis pedis pulse and anterior tibial pulse and has a strong triphasic posterior tibial pulse.  Patient has no sausage digit swelling of the toes.  There is a healing ulcer on the plantar aspect of the second toe.  Patient's most recent hemoglobin A1c is 6.7.  Most recent uric acid is 7.5 and he is on allopurinol.  Patient denies history of smoking.  Imaging: No results found. No images are attached to the encounter.  Labs: Lab Results  Component Value Date   HGBA1C 6.7 (A) 04/19/2021   HGBA1C 7.1 (A) 12/19/2020   HGBA1C 7.1 (H) 12/05/2020   LABURIC 7.5 12/28/2020   LABURIC 9.7 (H) 12/05/2020   LABORGA Insignificant Growth  01/24/2014     Lab Results  Component Value Date   ALBUMIN 4.4 09/22/2014   ALBUMIN 4.3 11/11/2012   ALBUMIN 4.2 08/28/2010    Lab Results  Component Value Date   MG 1.9 09/22/2014   No results found for: "VD25OH"  No results found for: "PREALBUMIN"    Latest Ref Rng & Units 12/05/2020    3:39 PM 09/28/2020    4:22 AM 09/27/2020   11:50 AM  CBC EXTENDED  WBC 3.4 - 10.8 x10E3/uL 7.2  12.2    RBC 4.14 - 5.80 x10E6/uL 4.62  4.39    Hemoglobin 13.0 - 17.7 g/dL 12.8  12.3  12.9   HCT 37.5 - 51.0 % 37.8  36.3  38.1   Platelets 150 - 450 x10E3/uL 260  202       There is no height or weight on file to calculate BMI.  Orders:  Orders Placed This Encounter  Procedures   XR Foot Complete Left   No orders of the defined types were placed in this encounter.    Procedures: No procedures performed  Clinical Data: No additional findings.  ROS:  All other systems negative, except as noted in the HPI. Review of Systems  Objective: Vital Signs: There were no vitals taken for this visit.  Specialty Comments:  No  specialty comments available.  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 07/21/2019   IVCD (intraventricular conduction defect)    Ischemic cardiomyopathy    Hypothyroidism    DM type 2 (diabetes mellitus, type 2) ([place])    CRI (chronic renal insufficiency)    Diabetes ([place]) 03/22/2015   BPH (benign prostatic hyperplasia) 02/15/2014   Carotid artery disease without cerebral infarction ([place]) 08/65/7846   Chronic systolic CHF (congestive heart failure) ([place]) 11/30/2012   Hx of colonic polyps 12/24/2011   Automatic implantable cardioverter-defibrillator in situ    Hx of radiation therapy    INCREASED BLOOD PRESSURE 02/17/2008   ERECTILE DYSFUNCTION 02/03/2008   HYPERLIPIDEMIA 05/12/2007   HYPOTHYROIDISM 02/15/2006   Ischemic cardiomyopathy-status post  CABG x5 2003 02/15/2006   HX, PERSONAL, HODGKIN'S DISEASE 02/15/2006   Paroxysmal atrial fibrillation ([place]) 02/15/2006   Past Medical History:  Diagnosis Date   AICD (automatic cardioverter/defibrillator) present    Atrial fibrillation (HCC)    Paroxysmal, limited   CAD (coronary artery disease)    CABG 9629   Chronic systolic heart failure (HCC)    EF 20-25% echo 5- 2011   CRI (chronic renal insufficiency)     multifactorial... Dr. Posey Pronto.. consult September 18, 2009   DM type 2 (diabetes mellitus, type 2) ([place])    Dr Loanne Drilling   Ejection fraction < 50%    EF 25%, echo, November, 2012  //   planning followup 2-D echo to assess LV function with CRT D.  device in place   Elevated serum creatinine    Epididymal cyst 2017   right, no testis masses   Hodgkin's disease     Dx aprox 2004, s/p XRT-Chemo/had radiation too   Hyperkalemia    August, 2011, Aldactone, Aldactone stopped   Hyperlipidemia    low HDL   Hypothyroidism    ICD (implantable cardiac defibrillator) in place    CRT-D placed March, 2013   Ischemic cardiomyopathy    MRI12/12 no real viability in hypo/akinetic segment  CAth native and graft disease   IVCD (intraventricular conduction defect)    Myocardial infarction ([place])    mild MI/ several MI per pt in the past.   Personal history of colonic adenomas 12/24/2011   Polysubstance    Alcohol, cocaine- resoloved for many years    Presence of permanent cardiac pacemaker     Family History  Adopted: Yes  Family history unknown: Yes    Past Surgical History:  Procedure Laterality Date   BI-VENTRICULAR IMPLANTABLE CARDIOVERTER DEFIBRILLATOR N/A 04/01/2011   Procedure: BI-VENTRICULAR IMPLANTABLE CARDIOVERTER DEFIBRILLATOR  (CRT-D);  Surgeon: Deboraha Sprang, MD;  Location: [facility] CATH LAB;  Service: Cardiovascular;  Laterality: N/A;   BIV ICD GENERATOR CHANGEOUT N/A 03/10/2017   Procedure: BIV ICD GENERATOR CHANGEOUT;  Surgeon: Deboraha Sprang, MD;  Location: [facility] CV  LAB;  Service: Cardiovascular;  Laterality: N/A;   CARDIAC DEFIBRILLATOR PLACEMENT  03/2011   CATARACT EXTRACTION     COLONOSCOPY  12/24/2011   Procedure: COLONOSCOPY;  Surgeon: [name], MD;  Location: [facility] ENDOSCOPY;  Service: Endoscopy;  Laterality: N/A;   COLONOSCOPY WITH PROPOFOL N/A 06/30/2017   Procedure: COLONOSCOPY WITH PROPOFOL;  Surgeon: [name], MD;  Location: [facility] ENDOSCOPY;  Service: Endoscopy;  Laterality: N/A;   CORONARY ARTERY BYPASS GRAFT  2003   CYSTECTOMY     Upper Back   POLYPECTOMY  06/30/2017  Procedure: POLYPECTOMY;  Surgeon: [name], MD;  Location: [facility] ENDOSCOPY;  Service: Endoscopy;;   ROBOTIC ASSITED PARTIAL NEPHRECTOMY Left 09/27/2020   Procedure: XI ROBOTIC ASSITED PARTIAL NEPHRECTOMY;  Surgeon: Alexis Frock, MD;  Location: [facility] ORS;  Service: Urology;  Laterality: Left;  3 HRS   TONSILLECTOMY     age 61   Social History   Occupational History   Occupation: musician  Tobacco Use   Smoking status: Never   Smokeless tobacco: Never  Vaping Use   Vaping Use: Never used  Substance and Sexual Activity   Alcohol use: No    Alcohol/week: 0.0 standard drinks of alcohol    Comment: Hx of abuse/clean since 2003    Drug use: No    Comment: Hx of abuse/clean for many years   Sexual activity: Not on file

## 2021-10-04 ENCOUNTER — Other Ambulatory Visit: Payer: Self-pay | Admitting: Internal Medicine

## 2021-10-04 NOTE — Progress Notes (Signed)
Remote ICD transmission.   

## 2021-10-11 ENCOUNTER — Ambulatory Visit: Payer: PRIVATE HEALTH INSURANCE | Admitting: Orthopedic Surgery

## 2021-10-12 ENCOUNTER — Other Ambulatory Visit: Payer: Self-pay | Admitting: *Deleted

## 2021-10-12 DIAGNOSIS — M79604 Pain in right leg: Secondary | ICD-10-CM

## 2021-10-16 ENCOUNTER — Ambulatory Visit: Payer: PRIVATE HEALTH INSURANCE | Admitting: Orthopedic Surgery

## 2021-10-18 ENCOUNTER — Telehealth: Payer: Self-pay | Admitting: Orthopedic Surgery

## 2021-10-18 NOTE — Telephone Encounter (Signed)
Pt called and states that he had to r/s vascular appt due to him playing at a friends funeral. He wanted you to know theres a reason but the next available was not until 10/23. Is this okay? Does he need to come in to see you before vascular or just wait?  Cb [id number]

## 2021-10-19 NOTE — Telephone Encounter (Signed)
SW pt, he states that his foot is actually looking better. I let him know to keep the appt on 10/23 with vascular and if anything changed with his foot he can always come in for an appt before then to be checked out.

## 2021-10-22 ENCOUNTER — Encounter (HOSPITAL_COMMUNITY): Payer: PRIVATE HEALTH INSURANCE

## 2021-10-22 ENCOUNTER — Encounter: Payer: PRIVATE HEALTH INSURANCE | Admitting: Surgery

## 2021-10-26 ENCOUNTER — Ambulatory Visit: Payer: PRIVATE HEALTH INSURANCE | Admitting: Family

## 2021-11-02 ENCOUNTER — Encounter: Payer: Self-pay | Admitting: Internal Medicine

## 2021-11-12 ENCOUNTER — Encounter: Payer: PRIVATE HEALTH INSURANCE | Admitting: Surgery

## 2021-11-12 ENCOUNTER — Telehealth: Payer: Self-pay | Admitting: Orthopedic Surgery

## 2021-11-12 ENCOUNTER — Ambulatory Visit (HOSPITAL_COMMUNITY): Payer: PRIVATE HEALTH INSURANCE

## 2021-11-12 ENCOUNTER — Other Ambulatory Visit: Payer: Self-pay

## 2021-11-12 DIAGNOSIS — L97521 Non-pressure chronic ulcer of other part of left foot limited to breakdown of skin: Secondary | ICD-10-CM

## 2021-11-12 DIAGNOSIS — I739 Peripheral vascular disease, unspecified: Secondary | ICD-10-CM

## 2021-11-12 NOTE — Telephone Encounter (Signed)
You saw this pt once on 09/17/21 had been referred to vascular for ABIS for dampened pulses and ulceration. He has cx ABIs x 4 since the referral and has cx 2 appts with you. Asking for vascular referral to [facility]. I can place this but did you want to see in office first?

## 2021-11-12 NOTE — Telephone Encounter (Signed)
Order in chart will each out to pt and advise.

## 2021-11-12 NOTE — Telephone Encounter (Signed)
Pt called requesting a referral to [facility] for a Hydrographic surveyor. Pt phone number is [phone].

## 2021-11-12 NOTE — Telephone Encounter (Signed)
Called and lm on vm to call with questions.

## 2021-11-26 ENCOUNTER — Telehealth: Payer: Self-pay | Admitting: Pharmacy Technician

## 2021-11-26 NOTE — Telephone Encounter (Signed)
Pharmacy Patient Advocate Encounter   Received notification from CoverMyMeds that prior authorization for Dexcom Transmitter is due for renewal.   Pt is going to a different practice. No PA submitted at this time.

## 2021-12-05 ENCOUNTER — Ambulatory Visit (INDEPENDENT_AMBULATORY_CARE_PROVIDER_SITE_OTHER): Payer: PRIVATE HEALTH INSURANCE

## 2021-12-05 DIAGNOSIS — I255 Ischemic cardiomyopathy: Secondary | ICD-10-CM | POA: Diagnosis not present

## 2021-12-05 DIAGNOSIS — Z95810 Presence of automatic (implantable) cardiac defibrillator: Secondary | ICD-10-CM

## 2021-12-06 LAB — CUP PACEART REMOTE DEVICE CHECK
Battery Remaining Longevity: 30 mo
Battery Remaining Percentage: 33 %
Battery Voltage: 2.92 V
Brady Statistic AP VP Percent: 56 %
Brady Statistic AP VS Percent: 1.2 %
Brady Statistic AS VP Percent: 39 %
Brady Statistic AS VS Percent: 1 %
Brady Statistic RA Percent Paced: 45 %
Date Time Interrogation Session: 20231115214046
HighPow Impedance: 70 Ohm
HighPow Impedance: 70 Ohm
Implantable Lead Connection Status: 753985
Implantable Lead Connection Status: 753985
Implantable Lead Connection Status: 753985
Implantable Lead Implant Date: 20130311
Implantable Lead Implant Date: 20130311
Implantable Lead Implant Date: 20130311
Implantable Lead Location: 753858
Implantable Lead Location: 753859
Implantable Lead Location: 753860
Implantable Lead Model: 7122
Implantable Pulse Generator Implant Date: 20190218
Lead Channel Impedance Value: 340 Ohm
Lead Channel Impedance Value: 350 Ohm
Lead Channel Impedance Value: 780 Ohm
Lead Channel Pacing Threshold Amplitude: 0.875 V
Lead Channel Pacing Threshold Amplitude: 0.875 V
Lead Channel Pacing Threshold Amplitude: 5 V
Lead Channel Pacing Threshold Pulse Width: 0.05 ms
Lead Channel Pacing Threshold Pulse Width: 0.5 ms
Lead Channel Pacing Threshold Pulse Width: 0.5 ms
Lead Channel Sensing Intrinsic Amplitude: 12 mV
Lead Channel Sensing Intrinsic Amplitude: 5 mV
Lead Channel Setting Pacing Amplitude: 0.25 V
Lead Channel Setting Pacing Amplitude: 1.875
Lead Channel Setting Pacing Amplitude: 2 V
Lead Channel Setting Pacing Pulse Width: 0.05 ms
Lead Channel Setting Pacing Pulse Width: 0.5 ms
Lead Channel Setting Sensing Sensitivity: 0.5 mV
Pulse Gen Serial Number: 9780477

## 2021-12-24 ENCOUNTER — Encounter: Payer: Self-pay | Admitting: Internal Medicine

## 2021-12-25 ENCOUNTER — Other Ambulatory Visit: Payer: Self-pay | Admitting: Internal Medicine

## 2021-12-25 NOTE — Telephone Encounter (Signed)
Rx refill sent to pharmacy. 

## 2021-12-28 NOTE — Progress Notes (Signed)
Remote ICD transmission.   

## 2021-12-31 MED ORDER — CARVEDILOL 6.25 MG PO TABS: 6.2500 mg | ORAL_TABLET | Freq: Two times a day (BID) | ORAL | 3 refills | Status: AC

## 2021-12-31 NOTE — Telephone Encounter (Signed)
Please refill meds to get [facility] into [facility]

## 2022-01-15 ENCOUNTER — Other Ambulatory Visit: Payer: Self-pay

## 2022-01-15 ENCOUNTER — Other Ambulatory Visit: Payer: Self-pay | Admitting: Internal Medicine

## 2022-01-15 DIAGNOSIS — I480 Paroxysmal atrial fibrillation: Secondary | ICD-10-CM

## 2022-01-15 MED ORDER — ELIQUIS 5 MG PO TABS: 5.0000 mg | ORAL_TABLET | Freq: Two times a day (BID) | ORAL | 5 refills | Status: AC

## 2022-01-15 NOTE — Telephone Encounter (Signed)
Prescription refill request for Eliquis received. Indication:afib Last office visit:7/23 Scr:1.6 Age: 66 Weight:93 kg  Prescription refilled

## 2022-01-22 ENCOUNTER — Encounter: Payer: Self-pay | Admitting: Internal Medicine

## 2022-01-22 NOTE — Progress Notes (Unsigned)
Called and spoke to him regarding his request about EP physicians at [facility].  He has been driven and forced there by the his symptom ignominious and profit driven immorality of his insurance company. Stepping down off my soap box, I gave him the name of Drs. Remus Blake and Beaty

## 2022-01-22 NOTE — Telephone Encounter (Signed)
Per Dr Caryl Comes:  Caleb Bell and spoke to him regarding his request about EP physicians at [facility].  He has been driven and forced there by the his symptom ignominious and profit driven immorality of his insurance company. Stepping down off my soap box, I gave him the name of Drs. Remus Blake and Beaty

## 2022-02-19 ENCOUNTER — Telehealth: Payer: Self-pay

## 2022-02-19 NOTE — Telephone Encounter (Signed)
Device alert for AF Persistent AF, since 1/18, overall controlled rates [place]den 26%, Eliquis Route for persistent AF LA  Spoke with patient. He has not had any symptoms and is doing well.  Has hx of persistent AF.  Has not missed any doses of medications and continues to take the Eliquis as prescribed.  Will forward to Dr. Caryl Comes as an [facility] and will let patient know if anything further other than continued monitoring at this point.

## 2022-02-23 NOTE — Telephone Encounter (Signed)
Ladies,  the last communication with him was that he was having to transfer his care to [facility] -- is taht still the case

## 2022-02-25 ENCOUNTER — Encounter: Payer: Self-pay | Admitting: Internal Medicine

## 2022-02-25 NOTE — Telephone Encounter (Signed)
Spoke with patient.  Yes, he has the following new patient appointments set up with [facility] cardiac care team:  Dr. Remus Blake on 05/01/22 (electrophysiologist)  2   Dr. Hampton Abbot on  03/12/22 (general cardiologist)   I let patient know that we will take care of the EP medical and monitoring needs until he has officially been established with new EP on 05/01/22.  At that time, we can then transition over his care and monitoring.

## 2022-02-26 NOTE — Telephone Encounter (Signed)
The patient has persistent atrial fibrillation with a controlled ventricular response.  As he is no longer covered here by insurance, in the absence of significant potential untoward events, would suggest that he wait until his appointment 2/20 where he can talk with his new cardiologist at [facility] about the atrial fibrillation

## 2022-02-28 ENCOUNTER — Encounter (HOSPITAL_COMMUNITY): Payer: Self-pay | Admitting: *Deleted

## 2022-03-05 NOTE — Telephone Encounter (Signed)
LM to relay Dr. Olin Pia comments below on having new cardiologist at his 2/20 appt take over his AFIB monitoring/care.

## 2022-03-05 NOTE — Telephone Encounter (Signed)
Patient called back and I have let him know about SK last note and pt verbalized understanding and will f/u with new cardiologist on 2/20

## 2022-03-06 ENCOUNTER — Ambulatory Visit: Payer: PRIVATE HEALTH INSURANCE

## 2022-03-06 DIAGNOSIS — I255 Ischemic cardiomyopathy: Secondary | ICD-10-CM

## 2022-03-06 LAB — CUP PACEART REMOTE DEVICE CHECK
Battery Remaining Longevity: 29 mo
Battery Remaining Percentage: 30 %
Battery Voltage: 2.9 V
Brady Statistic AP VP Percent: 55 %
Brady Statistic AP VS Percent: 1.1 %
Brady Statistic AS VP Percent: 40 %
Brady Statistic AS VS Percent: 1 %
Brady Statistic RA Percent Paced: 36 %
Date Time Interrogation Session: 20240214040017
HighPow Impedance: 66 Ohm
HighPow Impedance: 66 Ohm
Implantable Lead Connection Status: 753985
Implantable Lead Connection Status: 753985
Implantable Lead Connection Status: 753985
Implantable Lead Implant Date: 20130311
Implantable Lead Implant Date: 20130311
Implantable Lead Implant Date: 20130311
Implantable Lead Location: 753858
Implantable Lead Location: 753859
Implantable Lead Location: 753860
Implantable Lead Model: 7122
Implantable Pulse Generator Implant Date: 20190218
Lead Channel Impedance Value: 330 Ohm
Lead Channel Impedance Value: 350 Ohm
Lead Channel Impedance Value: 730 Ohm
Lead Channel Pacing Threshold Amplitude: 0.75 V
Lead Channel Pacing Threshold Amplitude: 1 V
Lead Channel Pacing Threshold Amplitude: 5 V
Lead Channel Pacing Threshold Pulse Width: 0.05 ms
Lead Channel Pacing Threshold Pulse Width: 0.5 ms
Lead Channel Pacing Threshold Pulse Width: 0.5 ms
Lead Channel Sensing Intrinsic Amplitude: 11.9 mV
Lead Channel Sensing Intrinsic Amplitude: 5 mV
Lead Channel Setting Pacing Amplitude: 0.25 V
Lead Channel Setting Pacing Amplitude: 1.75 V
Lead Channel Setting Pacing Amplitude: 2 V
Lead Channel Setting Pacing Pulse Width: 0.05 ms
Lead Channel Setting Pacing Pulse Width: 0.5 ms
Lead Channel Setting Sensing Sensitivity: 0.5 mV
Pulse Gen Serial Number: 9780477

## 2022-04-04 NOTE — Progress Notes (Signed)
Remote ICD transmission.   

## 2022-06-05 ENCOUNTER — Ambulatory Visit: Payer: Managed Care, Other (non HMO)

## 2022-06-06 ENCOUNTER — Other Ambulatory Visit (HOSPITAL_COMMUNITY): Payer: Self-pay

## 2022-06-20 ENCOUNTER — Other Ambulatory Visit (HOSPITAL_COMMUNITY): Payer: Self-pay

## 2022-08-01 LAB — LAB REPORT - SCANNED
Creatinine, POC: 55.6 mg/dL
EGFR: 40

## 2022-09-04 ENCOUNTER — Ambulatory Visit: Payer: Managed Care, Other (non HMO)

## 2022-09-30 ENCOUNTER — Other Ambulatory Visit: Payer: Self-pay

## 2022-09-30 MED ORDER — SPIRONOLACTONE 25 MG PO TABS: 12.5000 mg | ORAL_TABLET | Freq: Every day | ORAL | 0 refills | Status: AC

## 2022-11-07 ENCOUNTER — Other Ambulatory Visit: Payer: Self-pay | Admitting: Internal Medicine

## 2022-12-04 ENCOUNTER — Ambulatory Visit: Payer: Managed Care, Other (non HMO)

## 2023-03-05 ENCOUNTER — Ambulatory Visit: Payer: Managed Care, Other (non HMO)

## 2023-06-04 ENCOUNTER — Ambulatory Visit: Payer: Managed Care, Other (non HMO)

## 2023-09-03 ENCOUNTER — Ambulatory Visit: Payer: Managed Care, Other (non HMO)

## 2023-12-03 ENCOUNTER — Ambulatory Visit: Payer: Managed Care, Other (non HMO)

## 2024-03-03 ENCOUNTER — Ambulatory Visit: Payer: Managed Care, Other (non HMO)

## 2024-06-02 ENCOUNTER — Ambulatory Visit: Payer: Managed Care, Other (non HMO)
